# Patient Record
Sex: Male | Born: 1950 | Race: White | Hispanic: No | Marital: Married | Smoking: Former smoker
Health system: Southern US, Community
[De-identification: ages and names within clinical notes are randomized; demographics above are authoritative.]

## PROBLEM LIST (undated history)

## (undated) DIAGNOSIS — E119 Type 2 diabetes mellitus without complications: Secondary | ICD-10-CM

## (undated) DIAGNOSIS — H543 Unqualified visual loss, both eyes: Secondary | ICD-10-CM

## (undated) DIAGNOSIS — I10 Essential (primary) hypertension: Secondary | ICD-10-CM

## (undated) DIAGNOSIS — C801 Malignant (primary) neoplasm, unspecified: Secondary | ICD-10-CM

## (undated) DIAGNOSIS — N189 Chronic kidney disease, unspecified: Secondary | ICD-10-CM

## (undated) HISTORY — PX: EYE SURGERY: SHX253

## (undated) HISTORY — PX: NOSE SURGERY: SHX723

## (undated) HISTORY — PX: COLONOSCOPY: SHX174

---

## 2004-04-18 ENCOUNTER — Ambulatory Visit: Payer: Self-pay | Admitting: Oncology

## 2004-05-14 ENCOUNTER — Ambulatory Visit: Payer: Self-pay | Admitting: Oncology

## 2004-07-18 ENCOUNTER — Ambulatory Visit: Payer: Self-pay | Admitting: Oncology

## 2004-07-25 ENCOUNTER — Ambulatory Visit: Payer: Self-pay | Admitting: Oncology

## 2004-08-14 ENCOUNTER — Ambulatory Visit: Payer: Self-pay | Admitting: Oncology

## 2004-10-09 ENCOUNTER — Ambulatory Visit: Payer: Self-pay | Admitting: Oncology

## 2004-10-12 ENCOUNTER — Ambulatory Visit: Payer: Self-pay | Admitting: Oncology

## 2004-11-11 ENCOUNTER — Ambulatory Visit: Payer: Self-pay | Admitting: Oncology

## 2005-01-23 ENCOUNTER — Ambulatory Visit: Payer: Self-pay | Admitting: Oncology

## 2005-02-11 ENCOUNTER — Ambulatory Visit: Payer: Self-pay | Admitting: Oncology

## 2005-04-17 ENCOUNTER — Ambulatory Visit: Payer: Self-pay | Admitting: Oncology

## 2005-05-14 ENCOUNTER — Ambulatory Visit: Payer: Self-pay | Admitting: Oncology

## 2005-08-07 ENCOUNTER — Ambulatory Visit: Payer: Self-pay | Admitting: Oncology

## 2005-08-22 ENCOUNTER — Ambulatory Visit: Payer: Self-pay | Admitting: Oncology

## 2005-09-11 ENCOUNTER — Ambulatory Visit: Payer: Self-pay | Admitting: Oncology

## 2005-11-13 ENCOUNTER — Ambulatory Visit: Payer: Self-pay | Admitting: Oncology

## 2005-12-12 ENCOUNTER — Ambulatory Visit: Payer: Self-pay | Admitting: Oncology

## 2006-02-05 ENCOUNTER — Ambulatory Visit: Payer: Self-pay | Admitting: Oncology

## 2006-02-11 ENCOUNTER — Ambulatory Visit: Payer: Self-pay | Admitting: Oncology

## 2006-03-14 ENCOUNTER — Ambulatory Visit: Payer: Self-pay | Admitting: Oncology

## 2006-05-28 ENCOUNTER — Ambulatory Visit: Payer: Self-pay | Admitting: Oncology

## 2006-06-13 ENCOUNTER — Ambulatory Visit: Payer: Self-pay | Admitting: Oncology

## 2006-07-24 ENCOUNTER — Ambulatory Visit: Payer: Self-pay | Admitting: Oncology

## 2006-08-07 ENCOUNTER — Ambulatory Visit: Payer: Self-pay | Admitting: Oncology

## 2006-09-28 ENCOUNTER — Ambulatory Visit: Payer: Self-pay | Admitting: Gastroenterology

## 2006-11-26 ENCOUNTER — Ambulatory Visit: Payer: Self-pay | Admitting: Oncology

## 2006-12-13 ENCOUNTER — Ambulatory Visit: Payer: Self-pay | Admitting: Oncology

## 2007-04-14 ENCOUNTER — Ambulatory Visit: Payer: Self-pay | Admitting: Oncology

## 2007-05-13 ENCOUNTER — Ambulatory Visit: Payer: Self-pay | Admitting: Oncology

## 2007-05-15 ENCOUNTER — Ambulatory Visit: Payer: Self-pay | Admitting: Oncology

## 2007-08-13 ENCOUNTER — Ambulatory Visit: Payer: Self-pay | Admitting: Oncology

## 2007-09-17 ENCOUNTER — Ambulatory Visit: Payer: Self-pay | Admitting: Oncology

## 2007-11-11 ENCOUNTER — Ambulatory Visit: Payer: Self-pay | Admitting: Oncology

## 2007-11-12 ENCOUNTER — Ambulatory Visit: Payer: Self-pay | Admitting: Oncology

## 2008-06-13 ENCOUNTER — Ambulatory Visit: Payer: Self-pay | Admitting: Oncology

## 2008-06-19 ENCOUNTER — Ambulatory Visit: Payer: Self-pay | Admitting: Oncology

## 2008-07-14 ENCOUNTER — Ambulatory Visit: Payer: Self-pay | Admitting: Oncology

## 2008-08-14 ENCOUNTER — Ambulatory Visit: Payer: Self-pay | Admitting: Oncology

## 2008-09-11 ENCOUNTER — Ambulatory Visit: Payer: Self-pay | Admitting: Oncology

## 2009-02-11 ENCOUNTER — Ambulatory Visit: Payer: Self-pay | Admitting: Oncology

## 2009-02-19 ENCOUNTER — Ambulatory Visit: Payer: Self-pay | Admitting: Oncology

## 2009-03-14 ENCOUNTER — Ambulatory Visit: Payer: Self-pay | Admitting: Oncology

## 2009-06-13 ENCOUNTER — Ambulatory Visit: Payer: Self-pay | Admitting: Oncology

## 2009-06-21 ENCOUNTER — Ambulatory Visit: Payer: Self-pay | Admitting: Oncology

## 2009-06-27 ENCOUNTER — Ambulatory Visit: Payer: Self-pay | Admitting: Oncology

## 2009-07-14 ENCOUNTER — Ambulatory Visit: Payer: Self-pay | Admitting: Oncology

## 2010-01-02 ENCOUNTER — Ambulatory Visit: Payer: Self-pay | Admitting: Gastroenterology

## 2010-01-11 ENCOUNTER — Ambulatory Visit: Payer: Self-pay | Admitting: Oncology

## 2010-01-23 ENCOUNTER — Ambulatory Visit: Payer: Self-pay | Admitting: Oncology

## 2010-02-11 ENCOUNTER — Ambulatory Visit: Payer: Self-pay | Admitting: Oncology

## 2010-07-16 ENCOUNTER — Ambulatory Visit: Payer: Self-pay | Admitting: Oncology

## 2010-07-23 ENCOUNTER — Ambulatory Visit: Payer: Self-pay | Admitting: Oncology

## 2010-08-14 ENCOUNTER — Ambulatory Visit: Payer: Self-pay | Admitting: Oncology

## 2011-01-31 ENCOUNTER — Ambulatory Visit: Payer: Self-pay | Admitting: Oncology

## 2011-02-12 ENCOUNTER — Ambulatory Visit: Payer: Self-pay | Admitting: Oncology

## 2011-07-10 ENCOUNTER — Ambulatory Visit: Payer: Self-pay | Admitting: Oncology

## 2011-07-14 ENCOUNTER — Ambulatory Visit: Payer: Self-pay | Admitting: Oncology

## 2011-07-15 ENCOUNTER — Ambulatory Visit: Payer: Self-pay | Admitting: Oncology

## 2012-01-08 ENCOUNTER — Ambulatory Visit: Payer: Self-pay | Admitting: Oncology

## 2012-01-08 LAB — COMPREHENSIVE METABOLIC PANEL
Albumin: 4 g/dL (ref 3.4–5.0)
Alkaline Phosphatase: 96 U/L (ref 50–136)
BUN: 21 mg/dL — ABNORMAL HIGH (ref 7–18)
Calcium, Total: 9.1 mg/dL (ref 8.5–10.1)
Chloride: 103 mmol/L (ref 98–107)
Creatinine: 2.09 mg/dL — ABNORMAL HIGH (ref 0.60–1.30)
EGFR (African American): 38 — ABNORMAL LOW
EGFR (Non-African Amer.): 33 — ABNORMAL LOW
Glucose: 211 mg/dL — ABNORMAL HIGH (ref 65–99)
Potassium: 4.6 mmol/L (ref 3.5–5.1)
SGOT(AST): 24 U/L (ref 15–37)
Sodium: 137 mmol/L (ref 136–145)
Total Protein: 7.4 g/dL (ref 6.4–8.2)

## 2012-01-08 LAB — CBC CANCER CENTER
Basophil #: 0.1 x10 3/mm (ref 0.0–0.1)
Eosinophil %: 4.2 %
HCT: 36.7 % — ABNORMAL LOW (ref 40.0–52.0)
HGB: 12.3 g/dL — ABNORMAL LOW (ref 13.0–18.0)
Lymphocyte #: 2 x10 3/mm (ref 1.0–3.6)
Lymphocyte %: 25.3 %
MCV: 94 fL (ref 80–100)
Monocyte %: 7.7 %
Neutrophil #: 4.8 x10 3/mm (ref 1.4–6.5)
Neutrophil %: 61.4 %
Platelet: 260 x10 3/mm (ref 150–440)
RDW: 14.5 % (ref 11.5–14.5)

## 2012-01-08 LAB — LACTATE DEHYDROGENASE: LDH: 176 U/L (ref 87–241)

## 2012-01-08 LAB — SEDIMENTATION RATE: Erythrocyte Sed Rate: 10 mm/hr (ref 0–20)

## 2012-01-12 ENCOUNTER — Ambulatory Visit: Payer: Self-pay | Admitting: Oncology

## 2012-04-07 ENCOUNTER — Ambulatory Visit: Payer: Self-pay | Admitting: Oncology

## 2012-04-07 LAB — CBC CANCER CENTER
Basophil #: 0.1 x10 3/mm (ref 0.0–0.1)
Basophil %: 1.5 %
Eosinophil #: 0.2 x10 3/mm (ref 0.0–0.7)
Lymphocyte #: 1.8 x10 3/mm (ref 1.0–3.6)
MCH: 31.4 pg (ref 26.0–34.0)
MCHC: 32.7 g/dL (ref 32.0–36.0)
MCV: 96 fL (ref 80–100)
Monocyte #: 0.6 x10 3/mm (ref 0.2–1.0)
Neutrophil #: 3.9 x10 3/mm (ref 1.4–6.5)
Neutrophil %: 59.5 %
Platelet: 246 x10 3/mm (ref 150–440)
RBC: 4.04 10*6/uL — ABNORMAL LOW (ref 4.40–5.90)
RDW: 13.8 % (ref 11.5–14.5)
WBC: 6.6 x10 3/mm (ref 3.8–10.6)

## 2012-04-07 LAB — COMPREHENSIVE METABOLIC PANEL
Albumin: 3.8 g/dL (ref 3.4–5.0)
Anion Gap: 9 (ref 7–16)
BUN: 21 mg/dL — ABNORMAL HIGH (ref 7–18)
Calcium, Total: 9 mg/dL (ref 8.5–10.1)
Chloride: 102 mmol/L (ref 98–107)
Co2: 26 mmol/L (ref 21–32)
Potassium: 4.1 mmol/L (ref 3.5–5.1)
SGOT(AST): 32 U/L (ref 15–37)
SGPT (ALT): 49 U/L (ref 12–78)
Sodium: 137 mmol/L (ref 136–145)

## 2012-04-13 ENCOUNTER — Ambulatory Visit: Payer: Self-pay | Admitting: Oncology

## 2012-07-13 ENCOUNTER — Ambulatory Visit: Payer: Self-pay | Admitting: Oncology

## 2012-07-19 ENCOUNTER — Ambulatory Visit: Payer: Self-pay | Admitting: Oncology

## 2012-07-19 LAB — CBC CANCER CENTER
Basophil #: 0 x10 3/mm (ref 0.0–0.1)
Eosinophil #: 0.1 x10 3/mm (ref 0.0–0.7)
HGB: 13.2 g/dL (ref 13.0–18.0)
MCH: 31.2 pg (ref 26.0–34.0)
MCHC: 33.6 g/dL (ref 32.0–36.0)
MCV: 93 fL (ref 80–100)
Monocyte #: 0.9 x10 3/mm (ref 0.2–1.0)
Monocyte %: 10.2 %
Neutrophil #: 6.4 x10 3/mm (ref 1.4–6.5)
Neutrophil %: 68.4 %
RDW: 13.8 % (ref 11.5–14.5)
WBC: 9.3 x10 3/mm (ref 3.8–10.6)

## 2012-07-19 LAB — COMPREHENSIVE METABOLIC PANEL
Bilirubin,Total: 0.3 mg/dL (ref 0.2–1.0)
Calcium, Total: 9.2 mg/dL (ref 8.5–10.1)
Chloride: 101 mmol/L (ref 98–107)
Co2: 25 mmol/L (ref 21–32)
Creatinine: 2.08 mg/dL — ABNORMAL HIGH (ref 0.60–1.30)
EGFR (African American): 38 — ABNORMAL LOW
EGFR (Non-African Amer.): 33 — ABNORMAL LOW
SGOT(AST): 23 U/L (ref 15–37)
Sodium: 137 mmol/L (ref 136–145)

## 2012-08-14 ENCOUNTER — Ambulatory Visit: Payer: Self-pay | Admitting: Oncology

## 2013-01-19 ENCOUNTER — Ambulatory Visit: Payer: Self-pay | Admitting: Oncology

## 2013-01-24 LAB — CBC CANCER CENTER
Eosinophil #: 0.2 x10 3/mm (ref 0.0–0.7)
HCT: 37 % — ABNORMAL LOW (ref 40.0–52.0)
HGB: 12.8 g/dL — ABNORMAL LOW (ref 13.0–18.0)
Lymphocyte #: 2.3 x10 3/mm (ref 1.0–3.6)
Lymphocyte %: 29.9 %
MCH: 31.8 pg (ref 26.0–34.0)
MCV: 92 fL (ref 80–100)
Monocyte #: 0.8 x10 3/mm (ref 0.2–1.0)
Neutrophil #: 4.2 x10 3/mm (ref 1.4–6.5)
Neutrophil %: 54.8 %
RBC: 4.01 10*6/uL — ABNORMAL LOW (ref 4.40–5.90)

## 2013-01-24 LAB — COMPREHENSIVE METABOLIC PANEL
Albumin: 3.6 g/dL (ref 3.4–5.0)
Alkaline Phosphatase: 93 U/L (ref 50–136)
Anion Gap: 6 — ABNORMAL LOW (ref 7–16)
BUN: 25 mg/dL — ABNORMAL HIGH (ref 7–18)
Bilirubin,Total: 0.3 mg/dL (ref 0.2–1.0)
Calcium, Total: 8.7 mg/dL (ref 8.5–10.1)
Chloride: 107 mmol/L (ref 98–107)
Creatinine: 2.11 mg/dL — ABNORMAL HIGH (ref 0.60–1.30)
EGFR (African American): 37 — ABNORMAL LOW
EGFR (Non-African Amer.): 32 — ABNORMAL LOW
Glucose: 102 mg/dL — ABNORMAL HIGH (ref 65–99)
Osmolality: 284 (ref 275–301)
Potassium: 4.1 mmol/L (ref 3.5–5.1)
Sodium: 140 mmol/L (ref 136–145)
Total Protein: 7.1 g/dL (ref 6.4–8.2)

## 2013-01-24 LAB — LACTATE DEHYDROGENASE: LDH: 193 U/L (ref 85–241)

## 2013-01-24 LAB — SEDIMENTATION RATE: Erythrocyte Sed Rate: 12 mm/hr (ref 0–20)

## 2013-02-11 ENCOUNTER — Ambulatory Visit: Payer: Self-pay | Admitting: Oncology

## 2013-07-22 ENCOUNTER — Ambulatory Visit: Payer: Self-pay | Admitting: Oncology

## 2013-07-25 ENCOUNTER — Ambulatory Visit: Payer: Self-pay | Admitting: Oncology

## 2013-07-25 LAB — CBC CANCER CENTER
Basophil #: 0.1 x10 3/mm (ref 0.0–0.1)
Basophil %: 0.7 %
EOS PCT: 2 %
Eosinophil #: 0.2 x10 3/mm (ref 0.0–0.7)
HCT: 42.8 % (ref 40.0–52.0)
HGB: 13.9 g/dL (ref 13.0–18.0)
LYMPHS ABS: 1.8 x10 3/mm (ref 1.0–3.6)
LYMPHS PCT: 14.9 %
MCH: 30.9 pg (ref 26.0–34.0)
MCHC: 32.6 g/dL (ref 32.0–36.0)
MCV: 95 fL (ref 80–100)
MONO ABS: 0.9 x10 3/mm (ref 0.2–1.0)
Monocyte %: 7.9 %
NEUTROS PCT: 74.5 %
Neutrophil #: 8.9 x10 3/mm — ABNORMAL HIGH (ref 1.4–6.5)
PLATELETS: 265 x10 3/mm (ref 150–440)
RBC: 4.52 10*6/uL (ref 4.40–5.90)
RDW: 14.1 % (ref 11.5–14.5)
WBC: 12 x10 3/mm — ABNORMAL HIGH (ref 3.8–10.6)

## 2013-07-25 LAB — COMPREHENSIVE METABOLIC PANEL
ALT: 38 U/L (ref 12–78)
AST: 22 U/L (ref 15–37)
Albumin: 3.7 g/dL (ref 3.4–5.0)
Alkaline Phosphatase: 78 U/L
Anion Gap: 8 (ref 7–16)
BUN: 26 mg/dL — AB (ref 7–18)
Bilirubin,Total: 0.3 mg/dL (ref 0.2–1.0)
CO2: 26 mmol/L (ref 21–32)
Calcium, Total: 9 mg/dL (ref 8.5–10.1)
Chloride: 102 mmol/L (ref 98–107)
Creatinine: 2.04 mg/dL — ABNORMAL HIGH (ref 0.60–1.30)
EGFR (African American): 39 — ABNORMAL LOW
EGFR (Non-African Amer.): 33 — ABNORMAL LOW
Glucose: 198 mg/dL — ABNORMAL HIGH (ref 65–99)
Osmolality: 282 (ref 275–301)
POTASSIUM: 5 mmol/L (ref 3.5–5.1)
SODIUM: 136 mmol/L (ref 136–145)
Total Protein: 7.1 g/dL (ref 6.4–8.2)

## 2013-07-25 LAB — LACTATE DEHYDROGENASE: LDH: 176 U/L (ref 85–241)

## 2013-08-14 ENCOUNTER — Ambulatory Visit: Payer: Self-pay | Admitting: Oncology

## 2013-12-09 DIAGNOSIS — N183 Chronic kidney disease, stage 3 unspecified: Secondary | ICD-10-CM | POA: Insufficient documentation

## 2013-12-09 DIAGNOSIS — K219 Gastro-esophageal reflux disease without esophagitis: Secondary | ICD-10-CM | POA: Insufficient documentation

## 2013-12-09 DIAGNOSIS — L409 Psoriasis, unspecified: Secondary | ICD-10-CM | POA: Insufficient documentation

## 2013-12-23 ENCOUNTER — Ambulatory Visit: Payer: Self-pay | Admitting: Oncology

## 2014-02-20 ENCOUNTER — Ambulatory Visit: Payer: Self-pay | Admitting: Oncology

## 2014-02-20 LAB — COMPREHENSIVE METABOLIC PANEL
ALK PHOS: 106 U/L
Albumin: 3.4 g/dL (ref 3.4–5.0)
Anion Gap: 9 (ref 7–16)
BUN: 22 mg/dL — ABNORMAL HIGH (ref 7–18)
Bilirubin,Total: 0.2 mg/dL (ref 0.2–1.0)
CO2: 27 mmol/L (ref 21–32)
Calcium, Total: 8.3 mg/dL — ABNORMAL LOW (ref 8.5–10.1)
Chloride: 101 mmol/L (ref 98–107)
Creatinine: 2.09 mg/dL — ABNORMAL HIGH (ref 0.60–1.30)
EGFR (African American): 38 — ABNORMAL LOW
EGFR (Non-African Amer.): 32 — ABNORMAL LOW
GLUCOSE: 234 mg/dL — AB (ref 65–99)
Osmolality: 285 (ref 275–301)
Potassium: 4.3 mmol/L (ref 3.5–5.1)
SGOT(AST): 22 U/L (ref 15–37)
SGPT (ALT): 48 U/L
SODIUM: 137 mmol/L (ref 136–145)
Total Protein: 7.1 g/dL (ref 6.4–8.2)

## 2014-02-20 LAB — LACTATE DEHYDROGENASE: LDH: 278 U/L — ABNORMAL HIGH (ref 85–241)

## 2014-02-20 LAB — CBC CANCER CENTER
Basophil #: 0 x10 3/mm (ref 0.0–0.1)
Basophil %: 0.2 %
EOS ABS: 0.2 x10 3/mm (ref 0.0–0.7)
EOS PCT: 1.8 %
HCT: 39.3 % — ABNORMAL LOW (ref 40.0–52.0)
HGB: 13.1 g/dL (ref 13.0–18.0)
LYMPHS ABS: 2 x10 3/mm (ref 1.0–3.6)
LYMPHS PCT: 18.9 %
MCH: 31.4 pg (ref 26.0–34.0)
MCHC: 33.2 g/dL (ref 32.0–36.0)
MCV: 95 fL (ref 80–100)
MONO ABS: 0.9 x10 3/mm (ref 0.2–1.0)
Monocyte %: 8.5 %
Neutrophil #: 7.4 x10 3/mm — ABNORMAL HIGH (ref 1.4–6.5)
Neutrophil %: 70.6 %
Platelet: 280 x10 3/mm (ref 150–440)
RBC: 4.16 10*6/uL — ABNORMAL LOW (ref 4.40–5.90)
RDW: 14.4 % (ref 11.5–14.5)
WBC: 10.4 x10 3/mm (ref 3.8–10.6)

## 2014-03-14 ENCOUNTER — Ambulatory Visit: Payer: Self-pay | Admitting: Oncology

## 2014-03-23 LAB — CBC CANCER CENTER
BASOS ABS: 0.1 x10 3/mm (ref 0.0–0.1)
BASOS PCT: 0.7 %
Eosinophil #: 0.1 x10 3/mm (ref 0.0–0.7)
Eosinophil %: 1.2 %
HCT: 37.9 % — ABNORMAL LOW (ref 40.0–52.0)
HGB: 12.5 g/dL — AB (ref 13.0–18.0)
LYMPHS ABS: 0.9 x10 3/mm — AB (ref 1.0–3.6)
Lymphocyte %: 10.1 %
MCH: 31 pg (ref 26.0–34.0)
MCHC: 33 g/dL (ref 32.0–36.0)
MCV: 94 fL (ref 80–100)
MONO ABS: 1.3 x10 3/mm — AB (ref 0.2–1.0)
Monocyte %: 13.5 %
NEUTROS ABS: 6.9 x10 3/mm — AB (ref 1.4–6.5)
Neutrophil %: 74.5 %
Platelet: 324 x10 3/mm (ref 150–440)
RBC: 4.04 10*6/uL — AB (ref 4.40–5.90)
RDW: 13 % (ref 11.5–14.5)
WBC: 9.3 x10 3/mm (ref 3.8–10.6)

## 2014-03-23 LAB — COMPREHENSIVE METABOLIC PANEL
ALBUMIN: 3 g/dL — AB (ref 3.4–5.0)
ALT: 39 U/L
Alkaline Phosphatase: 74 U/L
Anion Gap: 13 (ref 7–16)
BUN: 34 mg/dL — ABNORMAL HIGH (ref 7–18)
Bilirubin,Total: 0.4 mg/dL (ref 0.2–1.0)
CREATININE: 2.16 mg/dL — AB (ref 0.60–1.30)
Calcium, Total: 8.5 mg/dL (ref 8.5–10.1)
Chloride: 98 mmol/L (ref 98–107)
Co2: 21 mmol/L (ref 21–32)
EGFR (Non-African Amer.): 31 — ABNORMAL LOW
GFR CALC AF AMER: 36 — AB
Glucose: 197 mg/dL — ABNORMAL HIGH (ref 65–99)
Osmolality: 278 (ref 275–301)
POTASSIUM: 4.5 mmol/L (ref 3.5–5.1)
SGOT(AST): 20 U/L (ref 15–37)
SODIUM: 132 mmol/L — AB (ref 136–145)
Total Protein: 7.3 g/dL (ref 6.4–8.2)

## 2014-03-23 LAB — CLOSTRIDIUM DIFFICILE(ARMC)

## 2014-03-23 LAB — LACTATE DEHYDROGENASE: LDH: 127 U/L (ref 85–241)

## 2014-04-13 ENCOUNTER — Ambulatory Visit: Payer: Self-pay | Admitting: Oncology

## 2014-07-26 ENCOUNTER — Ambulatory Visit: Payer: Self-pay | Admitting: Oncology

## 2014-07-28 ENCOUNTER — Ambulatory Visit: Payer: Self-pay | Admitting: Oncology

## 2014-07-31 LAB — CBC CANCER CENTER
BASOS PCT: 1.4 %
Basophil #: 0.2 x10 3/mm — ABNORMAL HIGH (ref 0.0–0.1)
EOS ABS: 0.3 x10 3/mm (ref 0.0–0.7)
EOS PCT: 2.8 %
HCT: 42.2 % (ref 40.0–52.0)
HGB: 13.9 g/dL (ref 13.0–18.0)
LYMPHS PCT: 14.4 %
Lymphocyte #: 1.7 x10 3/mm (ref 1.0–3.6)
MCH: 30.8 pg (ref 26.0–34.0)
MCHC: 33 g/dL (ref 32.0–36.0)
MCV: 93 fL (ref 80–100)
MONO ABS: 0.8 x10 3/mm (ref 0.2–1.0)
Monocyte %: 6.8 %
NEUTROS PCT: 74.6 %
Neutrophil #: 8.6 x10 3/mm — ABNORMAL HIGH (ref 1.4–6.5)
Platelet: 239 x10 3/mm (ref 150–440)
RBC: 4.52 10*6/uL (ref 4.40–5.90)
RDW: 14.5 % (ref 11.5–14.5)
WBC: 11.6 x10 3/mm — ABNORMAL HIGH (ref 3.8–10.6)

## 2014-07-31 LAB — COMPREHENSIVE METABOLIC PANEL
ALK PHOS: 89 U/L
Albumin: 3.5 g/dL (ref 3.4–5.0)
Anion Gap: 8 (ref 7–16)
BUN: 20 mg/dL — AB (ref 7–18)
Bilirubin,Total: 0.2 mg/dL (ref 0.2–1.0)
Calcium, Total: 8.2 mg/dL — ABNORMAL LOW (ref 8.5–10.1)
Chloride: 105 mmol/L (ref 98–107)
Co2: 26 mmol/L (ref 21–32)
Creatinine: 2.04 mg/dL — ABNORMAL HIGH (ref 0.60–1.30)
EGFR (African American): 42 — ABNORMAL LOW
EGFR (Non-African Amer.): 35 — ABNORMAL LOW
Glucose: 207 mg/dL — ABNORMAL HIGH (ref 65–99)
Osmolality: 286 (ref 275–301)
POTASSIUM: 4.2 mmol/L (ref 3.5–5.1)
SGOT(AST): 21 U/L (ref 15–37)
SGPT (ALT): 38 U/L
Sodium: 139 mmol/L (ref 136–145)
TOTAL PROTEIN: 6.9 g/dL (ref 6.4–8.2)

## 2014-07-31 LAB — LACTATE DEHYDROGENASE: LDH: 183 U/L (ref 85–241)

## 2014-08-14 ENCOUNTER — Ambulatory Visit: Payer: Self-pay | Admitting: Oncology

## 2014-08-22 ENCOUNTER — Ambulatory Visit: Payer: Self-pay | Admitting: Internal Medicine

## 2014-08-25 ENCOUNTER — Ambulatory Visit: Payer: Self-pay | Admitting: Internal Medicine

## 2014-09-12 ENCOUNTER — Ambulatory Visit
Admit: 2014-09-12 | Disposition: A | Discharge status: Home / Self Care | Payer: Self-pay | Attending: Oncology | Admitting: Oncology

## 2014-10-13 ENCOUNTER — Ambulatory Visit
Admit: 2014-10-13 | Disposition: A | Discharge status: Home / Self Care | Payer: Self-pay | Attending: Oncology | Admitting: Oncology

## 2014-10-27 ENCOUNTER — Other Ambulatory Visit: Payer: Self-pay | Admitting: Oncology

## 2014-10-27 DIAGNOSIS — R599 Enlarged lymph nodes, unspecified: Secondary | ICD-10-CM

## 2014-11-06 LAB — CYTOLOGY - NON PAP

## 2015-01-05 ENCOUNTER — Other Ambulatory Visit: Payer: Self-pay | Admitting: Family Medicine

## 2015-01-08 ENCOUNTER — Ambulatory Visit
Admission: RE | Admit: 2015-01-08 | Discharge: 2015-01-08 | Disposition: A | Discharge status: Home / Self Care | Payer: Medicare Other | Source: Ambulatory Visit | Attending: Oncology | Admitting: Oncology

## 2015-01-08 ENCOUNTER — Other Ambulatory Visit: Payer: Medicare Other

## 2015-01-10 ENCOUNTER — Inpatient Hospital Stay: Payer: Medicare Other | Admitting: Oncology

## 2015-01-10 ENCOUNTER — Inpatient Hospital Stay: Payer: Medicare Other

## 2015-02-02 ENCOUNTER — Other Ambulatory Visit: Payer: Self-pay | Admitting: *Deleted

## 2015-02-02 DIAGNOSIS — C8590 Non-Hodgkin lymphoma, unspecified, unspecified site: Secondary | ICD-10-CM

## 2015-02-05 ENCOUNTER — Ambulatory Visit
Admission: RE | Admit: 2015-02-05 | Discharge: 2015-02-05 | Disposition: A | Discharge status: Home / Self Care | Payer: Medicare Other | Source: Ambulatory Visit | Attending: Oncology | Admitting: Oncology

## 2015-02-05 DIAGNOSIS — J439 Emphysema, unspecified: Secondary | ICD-10-CM | POA: Diagnosis not present

## 2015-02-05 DIAGNOSIS — I2510 Atherosclerotic heart disease of native coronary artery without angina pectoris: Secondary | ICD-10-CM | POA: Diagnosis not present

## 2015-02-05 DIAGNOSIS — R590 Localized enlarged lymph nodes: Secondary | ICD-10-CM | POA: Diagnosis not present

## 2015-02-05 DIAGNOSIS — R599 Enlarged lymph nodes, unspecified: Secondary | ICD-10-CM | POA: Diagnosis present

## 2015-02-07 ENCOUNTER — Encounter: Payer: Self-pay | Admitting: Oncology

## 2015-02-07 ENCOUNTER — Inpatient Hospital Stay: Payer: Medicare Other | Attending: Oncology | Admitting: Oncology

## 2015-02-07 ENCOUNTER — Inpatient Hospital Stay: Payer: Medicare Other

## 2015-02-07 VITALS — BP 164/85 | HR 64 | Temp 97.2°F | Wt 185.6 lb

## 2015-02-07 DIAGNOSIS — H540 Blindness, both eyes: Secondary | ICD-10-CM | POA: Insufficient documentation

## 2015-02-07 DIAGNOSIS — Z7982 Long term (current) use of aspirin: Secondary | ICD-10-CM

## 2015-02-07 DIAGNOSIS — Z923 Personal history of irradiation: Secondary | ICD-10-CM | POA: Diagnosis not present

## 2015-02-07 DIAGNOSIS — E119 Type 2 diabetes mellitus without complications: Secondary | ICD-10-CM | POA: Diagnosis not present

## 2015-02-07 DIAGNOSIS — C8518 Unspecified B-cell lymphoma, lymph nodes of multiple sites: Secondary | ICD-10-CM | POA: Insufficient documentation

## 2015-02-07 DIAGNOSIS — N189 Chronic kidney disease, unspecified: Secondary | ICD-10-CM | POA: Diagnosis not present

## 2015-02-07 DIAGNOSIS — H543 Unqualified visual loss, both eyes: Secondary | ICD-10-CM | POA: Insufficient documentation

## 2015-02-07 DIAGNOSIS — Z79899 Other long term (current) drug therapy: Secondary | ICD-10-CM | POA: Diagnosis not present

## 2015-02-07 DIAGNOSIS — I129 Hypertensive chronic kidney disease with stage 1 through stage 4 chronic kidney disease, or unspecified chronic kidney disease: Secondary | ICD-10-CM | POA: Insufficient documentation

## 2015-02-07 DIAGNOSIS — Z87891 Personal history of nicotine dependence: Secondary | ICD-10-CM | POA: Insufficient documentation

## 2015-02-07 DIAGNOSIS — C8590 Non-Hodgkin lymphoma, unspecified, unspecified site: Secondary | ICD-10-CM

## 2015-02-07 DIAGNOSIS — E1122 Type 2 diabetes mellitus with diabetic chronic kidney disease: Secondary | ICD-10-CM

## 2015-02-07 DIAGNOSIS — Z9221 Personal history of antineoplastic chemotherapy: Secondary | ICD-10-CM | POA: Diagnosis not present

## 2015-02-07 DIAGNOSIS — E669 Obesity, unspecified: Secondary | ICD-10-CM | POA: Insufficient documentation

## 2015-02-07 DIAGNOSIS — I10 Essential (primary) hypertension: Secondary | ICD-10-CM | POA: Insufficient documentation

## 2015-02-07 LAB — COMPREHENSIVE METABOLIC PANEL
ALT: 31 U/L (ref 17–63)
AST: 26 U/L (ref 15–41)
Albumin: 4.1 g/dL (ref 3.5–5.0)
Alkaline Phosphatase: 94 U/L (ref 38–126)
Anion gap: 4 — ABNORMAL LOW (ref 5–15)
BILIRUBIN TOTAL: 0.6 mg/dL (ref 0.3–1.2)
BUN: 22 mg/dL — ABNORMAL HIGH (ref 6–20)
CHLORIDE: 101 mmol/L (ref 101–111)
CO2: 27 mmol/L (ref 22–32)
Calcium: 8.6 mg/dL — ABNORMAL LOW (ref 8.9–10.3)
Creatinine, Ser: 1.85 mg/dL — ABNORMAL HIGH (ref 0.61–1.24)
GFR calc non Af Amer: 37 mL/min — ABNORMAL LOW (ref >60)
GFR, EST AFRICAN AMERICAN: 42 mL/min — AB (ref >60)
Glucose, Bld: 233 mg/dL — ABNORMAL HIGH (ref 65–99)
POTASSIUM: 4.5 mmol/L (ref 3.5–5.1)
Sodium: 132 mmol/L — ABNORMAL LOW (ref 135–145)
TOTAL PROTEIN: 6.9 g/dL (ref 6.5–8.1)

## 2015-02-07 LAB — CBC WITH DIFFERENTIAL/PLATELET
BASOS PCT: 1 %
Basophils Absolute: 0.1 10*3/uL (ref 0–0.1)
Eosinophils Absolute: 0.2 10*3/uL (ref 0–0.7)
Eosinophils Relative: 2 %
HEMATOCRIT: 42.5 % (ref 40.0–52.0)
HEMOGLOBIN: 14 g/dL (ref 13.0–18.0)
Lymphocytes Relative: 22 %
Lymphs Abs: 1.9 10*3/uL (ref 1.0–3.6)
MCH: 31.3 pg (ref 26.0–34.0)
MCHC: 32.9 g/dL (ref 32.0–36.0)
MCV: 95.2 fL (ref 80.0–100.0)
Monocytes Absolute: 0.6 10*3/uL (ref 0.2–1.0)
Monocytes Relative: 7 %
NEUTROS PCT: 68 %
Neutro Abs: 6.1 10*3/uL (ref 1.4–6.5)
Platelets: 246 10*3/uL (ref 150–440)
RBC: 4.47 MIL/uL (ref 4.40–5.90)
RDW: 14.6 % — AB (ref 11.5–14.5)
WBC: 9 10*3/uL (ref 3.8–10.6)

## 2015-02-07 LAB — LACTATE DEHYDROGENASE: LDH: 160 U/L (ref 98–192)

## 2015-02-07 NOTE — Progress Notes (Signed)
Patient does have living will.  Former smoker.  Currently chews tobacco.
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
## 2015-04-30 DIAGNOSIS — E119 Type 2 diabetes mellitus without complications: Secondary | ICD-10-CM | POA: Insufficient documentation

## 2015-05-09 ENCOUNTER — Encounter: Payer: Self-pay | Admitting: *Deleted

## 2015-05-10 ENCOUNTER — Encounter
Admission: RE | Disposition: A | Discharge status: Home / Self Care | Payer: Self-pay | Source: Ambulatory Visit | Attending: Gastroenterology

## 2015-05-10 ENCOUNTER — Ambulatory Visit: Payer: Medicare Other | Admitting: Certified Registered Nurse Anesthetist

## 2015-05-10 ENCOUNTER — Ambulatory Visit
Admission: RE | Admit: 2015-05-10 | Discharge: 2015-05-10 | Disposition: A | Discharge status: Home / Self Care | Payer: Medicare Other | Source: Ambulatory Visit | Attending: Gastroenterology | Admitting: Gastroenterology

## 2015-05-10 DIAGNOSIS — I129 Hypertensive chronic kidney disease with stage 1 through stage 4 chronic kidney disease, or unspecified chronic kidney disease: Secondary | ICD-10-CM | POA: Insufficient documentation

## 2015-05-10 DIAGNOSIS — E669 Obesity, unspecified: Secondary | ICD-10-CM | POA: Diagnosis not present

## 2015-05-10 DIAGNOSIS — K5730 Diverticulosis of large intestine without perforation or abscess without bleeding: Secondary | ICD-10-CM | POA: Insufficient documentation

## 2015-05-10 DIAGNOSIS — Z7982 Long term (current) use of aspirin: Secondary | ICD-10-CM | POA: Insufficient documentation

## 2015-05-10 DIAGNOSIS — E1022 Type 1 diabetes mellitus with diabetic chronic kidney disease: Secondary | ICD-10-CM | POA: Insufficient documentation

## 2015-05-10 DIAGNOSIS — Z882 Allergy status to sulfonamides status: Secondary | ICD-10-CM | POA: Insufficient documentation

## 2015-05-10 DIAGNOSIS — Z6829 Body mass index (BMI) 29.0-29.9, adult: Secondary | ICD-10-CM | POA: Insufficient documentation

## 2015-05-10 DIAGNOSIS — N189 Chronic kidney disease, unspecified: Secondary | ICD-10-CM | POA: Diagnosis not present

## 2015-05-10 DIAGNOSIS — Z79899 Other long term (current) drug therapy: Secondary | ICD-10-CM | POA: Insufficient documentation

## 2015-05-10 DIAGNOSIS — D120 Benign neoplasm of cecum: Secondary | ICD-10-CM | POA: Diagnosis not present

## 2015-05-10 DIAGNOSIS — Z8572 Personal history of non-Hodgkin lymphomas: Secondary | ICD-10-CM | POA: Insufficient documentation

## 2015-05-10 DIAGNOSIS — Z794 Long term (current) use of insulin: Secondary | ICD-10-CM | POA: Diagnosis not present

## 2015-05-10 DIAGNOSIS — Z87891 Personal history of nicotine dependence: Secondary | ICD-10-CM | POA: Insufficient documentation

## 2015-05-10 DIAGNOSIS — J449 Chronic obstructive pulmonary disease, unspecified: Secondary | ICD-10-CM | POA: Insufficient documentation

## 2015-05-10 DIAGNOSIS — Z7984 Long term (current) use of oral hypoglycemic drugs: Secondary | ICD-10-CM | POA: Diagnosis not present

## 2015-05-10 DIAGNOSIS — Z86010 Personal history of colonic polyps: Secondary | ICD-10-CM | POA: Insufficient documentation

## 2015-05-10 HISTORY — DX: Type 2 diabetes mellitus without complications: E11.9

## 2015-05-10 HISTORY — PX: COLONOSCOPY WITH PROPOFOL: SHX5780

## 2015-05-10 HISTORY — DX: Chronic kidney disease, unspecified: N18.9

## 2015-05-10 HISTORY — DX: Malignant (primary) neoplasm, unspecified: C80.1

## 2015-05-10 HISTORY — DX: Essential (primary) hypertension: I10

## 2015-05-10 LAB — GLUCOSE, CAPILLARY: GLUCOSE-CAPILLARY: 131 mg/dL — AB (ref 65–99)

## 2015-05-10 SURGERY — COLONOSCOPY WITH PROPOFOL
Anesthesia: General

## 2015-05-10 MED ORDER — SODIUM CHLORIDE 0.9 % IV SOLN: INTRAVENOUS | Status: DC

## 2015-05-10 MED ORDER — PROPOFOL 500 MG/50ML IV EMUL
INTRAVENOUS | Status: DC | PRN
  Administered 2015-05-10: 100 ug/kg/min via INTRAVENOUS

## 2015-05-10 MED ORDER — MIDAZOLAM HCL 2 MG/2ML IJ SOLN
INTRAMUSCULAR | Status: DC | PRN
  Administered 2015-05-10: 1 mg via INTRAVENOUS

## 2015-05-10 MED ORDER — FENTANYL CITRATE (PF) 100 MCG/2ML IJ SOLN
INTRAMUSCULAR | Status: DC | PRN
  Administered 2015-05-10: 50 ug via INTRAVENOUS

## 2015-05-10 MED ORDER — SODIUM CHLORIDE 0.9 % IV SOLN
INTRAVENOUS | Status: DC
  Administered 2015-05-10: 1000 mL via INTRAVENOUS

## 2015-05-10 MED ORDER — LIDOCAINE HCL (CARDIAC) 20 MG/ML IV SOLN
INTRAVENOUS | Status: DC | PRN
  Administered 2015-05-10: 50 mg via INTRAVENOUS

## 2015-05-10 NOTE — Op Note (Signed)
[facility] Patient Name: Mitchell Chang Procedure Date: 05/10/2015 8:02 AM MRN: [id number] Account #: [id number] Date of Birth: [birth year] Admit Type: Outpatient Age: 64 Room: [facility] ENDO ROOM 4 Gender: Male Note Status: Finalized Procedure:         Colonoscopy Indications:       Personal history of colonic polyps Providers:         Lupita Dawn. Candace Cruise, MD Referring MD:      Youlanda Roys. Ola Spurr, MD (Referring MD) Medicines:         Monitored Anesthesia Care Complications:     No immediate complications. Procedure:         Pre-Anesthesia Assessment:                    - Prior to the procedure, a History and Physical was                     performed, and patient medications, allergies and                     sensitivities were reviewed. The patient's tolerance of                     previous anesthesia was reviewed.                    - The risks and benefits of the procedure and the sedation                     options and risks were discussed with the patient. All                     questions were answered and informed consent was obtained.                    - After reviewing the risks and benefits, the patient was                     deemed in satisfactory condition to undergo the procedure.                    After obtaining informed consent, the colonoscope was                     passed under direct vision. Throughout the procedure, the                     patient's blood pressure, pulse, and oxygen saturations                     were monitored continuously. The Colonoscope was                     introduced through the anus and advanced to the the cecum,                     identified by appendiceal orifice and ileocecal valve. The                     colonoscopy was performed without difficulty. The patient                     tolerated the procedure well. The quality of the bowel  preparation was fair. Findings:      Multiple  small and large-mouthed diverticula were found in the sigmoid       colon, in the descending colon and in the ascending colon.      A small polyp was found in the cecum. The polyp was sessile. The polyp       was removed with a jumbo cold forceps. Resection and retrieval were       complete.      The exam was otherwise without abnormality. Impression:        - Diverticulosis in the sigmoid colon, in the descending                     colon and in the ascending colon.                    - One small polyp in the cecum. Resected and retrieved.                    - The examination was otherwise normal. Recommendation:    - Discharge patient to home.                    - Await pathology results.                    - Repeat colonoscopy in 5 years for surveillance based on                     pathology results.                    - The findings and recommendations were discussed with the                     patient. Procedure Code(s): --- Professional ---                    [phone], Colonoscopy, flexible; with biopsy, single or                     multiple Diagnosis Code(s): --- Professional ---                    D12.0, Benign neoplasm of cecum                    Z86.010, Personal history of colonic polyps                    K57.30, Diverticulosis of large intestine without                     perforation or abscess without bleeding CPT copyright 2014 American Medical Association. All rights reserved. The codes documented in this report are preliminary and upon coder review may  be revised to meet current compliance requirements. Hulen Luster, MD 05/10/2015 8:32:23 AM This report has been signed electronically. Number of Addenda: 0 Note Initiated On: 05/10/2015 8:02 AM Scope Withdrawal Time: 0 hours 13 minutes 30 seconds  Total Procedure Duration: 0 hours 19 minutes 47 seconds       [facility]

## 2015-05-10 NOTE — Anesthesia Procedure Notes (Signed)
Performed by: Vaughan Sine Pre-anesthesia Checklist: Patient identified, Emergency Drugs available, Suction available, Patient being monitored and Timeout performed Patient Re-evaluated:Patient Re-evaluated prior to inductionOxygen Delivery Method: Nasal cannula Preoxygenation: Pre-oxygenation with 100% oxygen Intubation Type: IV induction Airway Equipment and Method: Oral airway

## 2015-05-10 NOTE — Anesthesia Preprocedure Evaluation (Signed)
Anesthesia Evaluation  Patient identified by MRN, date of birth, ID band Patient awake    Reviewed: Allergy & Precautions, NPO status , Patient's Chart, lab work & pertinent test results  Airway Mallampati: III       Dental  (+) Poor Dentition   Pulmonary COPD, former smoker,    Pulmonary exam normal        Cardiovascular hypertension, Pt. on medications and Pt. on home beta blockers Normal cardiovascular exam     Neuro/Psych    GI/Hepatic Neg liver ROS, GERD  ,  Endo/Other  diabetes, Well Controlled, Type 1, Insulin Dependent  Renal/GU      Musculoskeletal   Abdominal (+) + obese,   Peds  Hematology   Anesthesia Other Findings   Reproductive/Obstetrics                             Anesthesia Physical Anesthesia Plan  ASA: III  Anesthesia Plan: General   Post-op Pain Management:    Induction: Intravenous  Airway Management Planned: Nasal Cannula  Additional Equipment:   Intra-op Plan:   Post-operative Plan:   Informed Consent: I have reviewed the patients History and Physical, chart, labs and discussed the procedure including the risks, benefits and alternatives for the proposed anesthesia with the patient or authorized representative who has indicated his/her understanding and acceptance.     Plan Discussed with: CRNA  Anesthesia Plan Comments:         Anesthesia Quick Evaluation

## 2015-05-10 NOTE — Anesthesia Postprocedure Evaluation (Signed)
  Anesthesia Post-op Note  Patient: Mitchell Chang.  Procedure(s) Performed: Procedure(s): COLONOSCOPY WITH PROPOFOL (N/A)  Anesthesia type:General  Patient location: PACU  Post pain: Pain level controlled  Post assessment: Post-op Vital signs reviewed, Patient's Cardiovascular Status Stable, Respiratory Function Stable, Patent Airway and No signs of Nausea or vomiting  Post vital signs: Reviewed and stable  Last Vitals:  Filed Vitals:   05/10/15 0839  BP: 106/65  Pulse:   Temp:   Resp:     Level of consciousness: awake, alert  and patient cooperative  Complications: No apparent anesthesia complications

## 2015-05-10 NOTE — Transfer of Care (Signed)
Immediate Anesthesia Transfer of Care Note  Patient: Mitchell Chang.  Procedure(s) Performed: Procedure(s): COLONOSCOPY WITH PROPOFOL (N/A)  Patient Location: PACU  Anesthesia Type:General  Level of Consciousness: awake and sedated  Airway & Oxygen Therapy: Patient Spontanous Breathing and Patient connected to nasal cannula oxygen  Post-op Assessment: Report given to RN and Post -op Vital signs reviewed and stable  Post vital signs: Reviewed and stable  Last Vitals:  Filed Vitals:   05/10/15 0837  BP:   Pulse:   Temp: 36.1 C  Resp:     Complications: No apparent anesthesia complications

## 2015-05-10 NOTE — H&P (Signed)
Primary Care Physician:  Adrian Prows, MD Primary Gastroenterologist:  Dr. Candace Cruise  Pre-Procedure History & Physical: HPI:  Mitchell Chang. is a 64 y.o. male is here for an colonoscopy.   Past Medical History  Diagnosis Date  . Hypertension   . Diabetes mellitus without complication ([place])   . Chronic kidney disease   . Cancer ([place])     non-hodgkins lymphoma    Past Surgical History  Procedure Laterality Date  . Nose surgery    . Eye surgery    . Colonoscopy      Prior to Admission medications   Medication Sig Start Date End Date Taking? Authorizing Provider  enalapril (VASOTEC) 20 MG tablet TAKE ONE TABLET BY MOUTH EVERY DAY 11/17/14  Yes Historical Provider, MD  metoprolol (LOPRESSOR) 100 MG tablet Take by mouth. 11/24/13  Yes Historical Provider, MD  NIFEdipine (PROCARDIA XL/ADALAT-CC) 60 MG 24 hr tablet Take by mouth.   Yes Historical Provider, MD  aspirin EC 81 MG tablet Take by mouth.    Historical Provider, MD  cholecalciferol (VITAMIN D) 1000 UNITS tablet Take 1,000 Units by mouth 2 (two) times daily.    Historical Provider, MD  clobetasol (TEMOVATE) 0.05 % external solution  01/31/15   Historical Provider, MD  doxepin (SINEQUAN) 50 MG capsule Take by mouth. 01/10/14   Historical Provider, MD  glipiZIDE (GLUCOTROL) 10 MG tablet Take by mouth. 06/16/14   Historical Provider, MD  Insulin Lispro Prot & Lispro (HUMALOG MIX 50/50 KWIKPEN) (50-50) 100 UNIT/ML Kwikpen Inject 45 units before breakfast and 28 units before supper 07/21/14   Historical Provider, MD  omeprazole (PRILOSEC) 20 MG capsule Take by mouth. 06/16/14   Historical Provider, MD  prednisoLONE sodium phosphate (INFLAMASE FORTE) 1 % ophthalmic solution Apply 1 % to eye.    Historical Provider, MD  sitaGLIPtin (JANUVIA) 50 MG tablet TAKE ONE TABLET BY MOUTH EVERY DAY 10/03/14   Historical Provider, MD    Allergies as of 04/03/2015 - Review Complete 02/07/2015  Allergen Reaction Noted  . Sulfa antibiotics Other  (See Comments) 02/05/2015    History reviewed. No pertinent family history.  Social History   Social History  . Marital Status: Married    Spouse Name: N/A  . Number of Children: N/A  . Years of Education: N/A   Occupational History  . Not on file.   Social History Main Topics  . Smoking status: Former Research scientist (life sciences)  . Smokeless tobacco: Not on file  . Alcohol Use: Not on file  . Drug Use: Not on file  . Sexual Activity: Not on file   Other Topics Concern  . Not on file   Social History Narrative    Review of Systems: See HPI, otherwise negative ROS  Physical Exam: BP 156/86 mmHg  Pulse 62  Temp(Src) 97 F (36.1 C) (Tympanic)  Resp 18  Ht 5\' 7"  (1.702 m)  Wt 86.183 kg (190 lb)  BMI 29.75 kg/m2  SpO2 100% General:   Alert,  pleasant and cooperative in NAD Head:  Normocephalic and atraumatic. Neck:  Supple; no masses or thyromegaly. Lungs:  Clear throughout to auscultation.    Heart:  Regular rate and rhythm. Abdomen:  Soft, nontender and nondistended. Normal bowel sounds, without guarding, and without rebound.   Neurologic:  Alert and  oriented x4;  grossly normal neurologically.  Impression/Plan: Mitchell Chad. is here for an colonoscopy to be performed for personal hx of colon polyps Risks, benefits, limitations, and alternatives regarding  colonoscopy have been reviewed with the patient.  Questions have been answered.  All parties agreeable.   Mitchell Chang, Mitchell Dawn, MD  05/10/2015, 7:59 AM

## 2015-05-11 LAB — SURGICAL PATHOLOGY

## 2015-05-13 ENCOUNTER — Encounter: Payer: Self-pay | Admitting: Gastroenterology

## 2015-06-13 ENCOUNTER — Inpatient Hospital Stay: Payer: Medicare Other | Attending: Oncology | Admitting: Oncology

## 2015-06-13 ENCOUNTER — Inpatient Hospital Stay: Payer: Medicare Other

## 2015-06-13 ENCOUNTER — Encounter: Payer: Self-pay | Admitting: Oncology

## 2015-06-13 VITALS — BP 142/92 | HR 63 | Temp 96.7°F | Wt 185.0 lb

## 2015-06-13 DIAGNOSIS — E119 Type 2 diabetes mellitus without complications: Secondary | ICD-10-CM | POA: Diagnosis not present

## 2015-06-13 DIAGNOSIS — Z7982 Long term (current) use of aspirin: Secondary | ICD-10-CM | POA: Diagnosis not present

## 2015-06-13 DIAGNOSIS — H540 Blindness, both eyes: Secondary | ICD-10-CM | POA: Insufficient documentation

## 2015-06-13 DIAGNOSIS — I129 Hypertensive chronic kidney disease with stage 1 through stage 4 chronic kidney disease, or unspecified chronic kidney disease: Secondary | ICD-10-CM | POA: Insufficient documentation

## 2015-06-13 DIAGNOSIS — Z87891 Personal history of nicotine dependence: Secondary | ICD-10-CM | POA: Insufficient documentation

## 2015-06-13 DIAGNOSIS — N189 Chronic kidney disease, unspecified: Secondary | ICD-10-CM

## 2015-06-13 DIAGNOSIS — Z23 Encounter for immunization: Secondary | ICD-10-CM | POA: Insufficient documentation

## 2015-06-13 DIAGNOSIS — Z923 Personal history of irradiation: Secondary | ICD-10-CM | POA: Insufficient documentation

## 2015-06-13 DIAGNOSIS — C8590 Non-Hodgkin lymphoma, unspecified, unspecified site: Secondary | ICD-10-CM

## 2015-06-13 DIAGNOSIS — Z9221 Personal history of antineoplastic chemotherapy: Secondary | ICD-10-CM | POA: Diagnosis not present

## 2015-06-13 DIAGNOSIS — C8223 Follicular lymphoma grade III, unspecified, intra-abdominal lymph nodes: Secondary | ICD-10-CM | POA: Insufficient documentation

## 2015-06-13 DIAGNOSIS — C8518 Unspecified B-cell lymphoma, lymph nodes of multiple sites: Secondary | ICD-10-CM | POA: Diagnosis present

## 2015-06-13 DIAGNOSIS — Z79899 Other long term (current) drug therapy: Secondary | ICD-10-CM | POA: Insufficient documentation

## 2015-06-13 LAB — CBC WITH DIFFERENTIAL/PLATELET
BASOS ABS: 0 10*3/uL (ref 0–0.1)
Basophils Relative: 0 %
EOS PCT: 3 %
Eosinophils Absolute: 0.2 10*3/uL (ref 0–0.7)
HCT: 42.2 % (ref 40.0–52.0)
Hemoglobin: 14.2 g/dL (ref 13.0–18.0)
Lymphocytes Relative: 24 %
Lymphs Abs: 1.9 10*3/uL (ref 1.0–3.6)
MCH: 31.7 pg (ref 26.0–34.0)
MCHC: 33.6 g/dL (ref 32.0–36.0)
MCV: 94.4 fL (ref 80.0–100.0)
Monocytes Absolute: 0.7 10*3/uL (ref 0.2–1.0)
Monocytes Relative: 9 %
Neutro Abs: 5.1 10*3/uL (ref 1.4–6.5)
Neutrophils Relative %: 64 %
PLATELETS: 254 10*3/uL (ref 150–440)
RBC: 4.47 MIL/uL (ref 4.40–5.90)
RDW: 14.4 % (ref 11.5–14.5)
WBC: 7.9 10*3/uL (ref 3.8–10.6)

## 2015-06-13 LAB — COMPREHENSIVE METABOLIC PANEL
ALT: 31 U/L (ref 17–63)
AST: 23 U/L (ref 15–41)
Albumin: 3.9 g/dL (ref 3.5–5.0)
Alkaline Phosphatase: 71 U/L (ref 38–126)
Anion gap: 6 (ref 5–15)
BUN: 21 mg/dL — ABNORMAL HIGH (ref 6–20)
CHLORIDE: 101 mmol/L (ref 101–111)
CO2: 26 mmol/L (ref 22–32)
CREATININE: 1.91 mg/dL — AB (ref 0.61–1.24)
Calcium: 8.7 mg/dL — ABNORMAL LOW (ref 8.9–10.3)
GFR calc non Af Amer: 35 mL/min — ABNORMAL LOW (ref >60)
GFR, EST AFRICAN AMERICAN: 40 mL/min — AB (ref >60)
Glucose, Bld: 256 mg/dL — ABNORMAL HIGH (ref 65–99)
POTASSIUM: 4.1 mmol/L (ref 3.5–5.1)
SODIUM: 133 mmol/L — AB (ref 135–145)
Total Bilirubin: 0.3 mg/dL (ref 0.3–1.2)
Total Protein: 6.8 g/dL (ref 6.5–8.1)

## 2015-06-13 LAB — LACTATE DEHYDROGENASE: LDH: 142 U/L (ref 98–192)

## 2015-06-13 MED ORDER — PNEUMOCOCCAL VAC POLYVALENT 25 MCG/0.5ML IJ INJ
0.5000 mL | INJECTION | Freq: Once | INTRAMUSCULAR | Status: AC
  Administered 2015-06-13: 0.5 mL via INTRAMUSCULAR

## 2015-06-13 NOTE — Progress Notes (Signed)
[place] @ [facility] [phone]) [phone]  Fax:(336) [place]. OB: [birth year]  MR#: [id number]  YSH#:[id number]  Patient Care Team: Adrian Prows, MD as PCP - General (Cardiology) Lavonia Dana, MD as Consulting Physician (Internal Medicine)  CHIEF COMPLAINT:  Chief Complaint  Patient presents with  . Lymphoma   Oncology History   1.  Poorly differentiated small cleave cell lymphoma, stage III. Diagnosed in October of 1992 and was treated with chemotherapy and radiation therapy.  2.  Follicular B-cell lymphoma, grade 3.  Left inguinal lymph node biopsy was CD20 positive. Diagnosis in March 2005. Completed maintenance Rituxan in July 2007. 3.abnormal PET scan with increase uptake in mediastinal and upper abdominal area EBUS  was negative for any malignancy(March, 2016)      1.  Poorly differentiated small cleave cell lymphoma, stage III. Diagnosed in October of 1992 and was treated with chemotherapy and radiation therapy.  2.  Follicular B-cell lymphoma, grade 3.  Left inguinal lymph node biopsy was CD20 positive. Diagnosis in March 2005. Completed maintenance Rituxan in July 2007. 3.abnormal PET scan with increase uptake in mediastinal and upper abdominal area EBUS  was negative for any malignancy(March, 2016) Recent CT scan (July, 2016) off chest shows stable mediastinal adenopathy  No flowsheet data found.  INTERVAL HISTORY:  64 year old gentleman with history of blindness, history of diabetes and chronic renal disease and hypertension and previous history of follicular lymphoma came today further follow-up last PET scan so some increase uptake patient underwent a bus which negative biopsy.   Patient is here for further evaluation regarding lymphoma.  Remains asymptomatic.  Patient admitted yesterday adenopathy which needs for further follow-up.  Patient also has chronic renal failure for which being followed by nephrologist on regular  basis.  Here for further follow-up and treatment consideration REVIEW OF SYSTEMS:   GENERAL:  Feels good.  Active.  No fevers, sweats or weight loss. PERFORMANCE STATUS (ECOG):  01 HEENT:  No visual changes, runny nose, sore throat, mouth sores or tenderness. Lungs: No shortness of breath or cough.  No hemoptysis. Cardiac:  No chest pain, palpitations, orthopnea, or PND. GI:  No nausea, vomiting, diarrhea, constipation, melena or hematochezia. GU:  No urgency, frequency, dysuria, or hematuria. Musculoskeletal:  No back pain.  No joint pain.  No muscle tenderness. Extremities:  No pain or swelling. Skin:  No rashes or skin changes. Neuro:  No headache, numbness or weakness, balance or coordination issues. Endocrine:  No diabetes, thyroid issues, hot flashes or night sweats. Psych:  No mood changes, depression or anxiety. Pain:  No focal pain. Review of systems:  All other systems reviewed and found to be negative. As per HPI. Otherwise, a complete review of systems is negatve.   Significant History/PMH:   blind:    diabetes:    HTN:   PFSH: Additional Past Medical and Surgical History: Past Medical History: Diabetes, Htn, Blindness due to accident    Past Surgical History: No significant past surgical history.     Family History: No family history of colorectal cancer, breast cancer or ovarian cancer.     Social History: Does not smoke.  Does not drink.   ADVANCED DIRECTIVES:  Patient does have advance healthcare directive, Patient   does not desire to make any changes HEALTH MAINTENANCE: Social History  Substance Use Topics  . Smoking status: Former Research scientist (life sciences)  . Smokeless tobacco: None  . Alcohol Use: None     Patient does not smoke  but   Chews tobacco Allergies  Allergen Reactions  . Sulfa Antibiotics Other (See Comments)    Patient states frequent and persistent urination.    Current Outpatient Prescriptions  Medication Sig Dispense Refill  . aspirin EC 81 MG  tablet Take by mouth.    . cholecalciferol (VITAMIN D) 1000 UNITS tablet Take 1,000 Units by mouth 2 (two) times daily.    . clobetasol (TEMOVATE) 0.05 % external solution     . doxepin (SINEQUAN) 50 MG capsule Take by mouth.    . enalapril (VASOTEC) 20 MG tablet TAKE ONE TABLET BY MOUTH EVERY DAY    . glipiZIDE (GLUCOTROL) 10 MG tablet Take by mouth.    . Insulin Lispro Prot & Lispro (HUMALOG MIX 50/50 KWIKPEN) (50-50) 100 UNIT/ML Kwikpen Inject 45 units before breakfast and 28 units before supper    . metoprolol (LOPRESSOR) 100 MG tablet Take by mouth.    Marland Kitchen NIFEdipine (PROCARDIA XL/ADALAT-CC) 60 MG 24 hr tablet Take by mouth.    Marland Kitchen omeprazole (PRILOSEC) 20 MG capsule Take by mouth.    . prednisoLONE sodium phosphate (INFLAMASE FORTE) 1 % ophthalmic solution Apply 1 % to eye.    . sitaGLIPtin (JANUVIA) 50 MG tablet TAKE ONE TABLET BY MOUTH EVERY DAY     Current Facility-Administered Medications  Medication Dose Route Frequency Provider Last Rate Last Dose  . pneumococcal 23 valent vaccine (PNU-IMMUNE) injection 0.5 mL  0.5 mL Intramuscular Once Forest Gleason, MD        OBJECTIVE:  Filed Vitals:   06/13/15 1555  BP: 142/92  Pulse: 63  Temp: 96.7 F (35.9 C)     Body mass index is 28.97 kg/(m^2).    ECOG FS:1 - Symptomatic but completely ambulatory  PHYSICAL EXAM: GENERAL:  Well developed, well nourished, sitting comfortably in the exam room in no acute distress. MENTAL STATUS:  Alert and oriented to person, place and time.  ENT:  Oropharynx clear without lesion.  Tongue normal. Mucous membranes moist.  RESPIRATORY:  Clear to auscultation without rales, wheezes or rhonchi. CARDIOVASCULAR:  Regular rate and rhythm without murmur, rub or gallop. BREAST:  Right breast without masses, skin changes or nipple discharge.  Left breast without masses, skin changes or nipple discharge. ABDOMEN:  Soft, non-tender, with active bowel sounds, and no hepatosplenomegaly.  No masses. BACK:  No CVA  tenderness.  No tenderness on percussion of the back or rib cage. SKIN:  No rashes, ulcers or lesions. EXTREMITIES: No edema, no skin discoloration or tenderness.  No palpable cords. LYMPH NODES: No palpable cervical, supraclavicular, axillary or inguinal adenopathy  NEUROLOGICAL: Unremarkable. PSYCH:  Appropriate.   LAB RESULTS:  Appointment on 06/13/2015  Component Date Value Ref Range Status  . WBC 06/13/2015 7.9  3.8 - 10.6 K/uL Final  . RBC 06/13/2015 4.47  4.40 - 5.90 MIL/uL Final  . Hemoglobin 06/13/2015 14.2  13.0 - 18.0 g/dL Final  . HCT 06/13/2015 42.2  40.0 - 52.0 % Final  . MCV 06/13/2015 94.4  80.0 - 100.0 fL Final  . MCH 06/13/2015 31.7  26.0 - 34.0 pg Final  . MCHC 06/13/2015 33.6  32.0 - 36.0 g/dL Final  . RDW 06/13/2015 14.4  11.5 - 14.5 % Final  . Platelets 06/13/2015 254  150 - 440 K/uL Final  . Neutrophils Relative % 06/13/2015 64   Final  . Neutro Abs 06/13/2015 5.1  1.4 - 6.5 K/uL Final  . Lymphocytes Relative 06/13/2015 24   Final  . Lymphs Abs  06/13/2015 1.9  1.0 - 3.6 K/uL Final  . Monocytes Relative 06/13/2015 9   Final  . Monocytes Absolute 06/13/2015 0.7  0.2 - 1.0 K/uL Final  . Eosinophils Relative 06/13/2015 3   Final  . Eosinophils Absolute 06/13/2015 0.2  0 - 0.7 K/uL Final  . Basophils Relative 06/13/2015 0   Final  . Basophils Absolute 06/13/2015 0.0  0 - 0.1 K/uL Final  . Sodium 06/13/2015 133* 135 - 145 mmol/L Final  . Potassium 06/13/2015 4.1  3.5 - 5.1 mmol/L Final  . Chloride 06/13/2015 101  101 - 111 mmol/L Final  . CO2 06/13/2015 26  22 - 32 mmol/L Final  . Glucose, Bld 06/13/2015 256* 65 - 99 mg/dL Final  . BUN 06/13/2015 21* 6 - 20 mg/dL Final  . Creatinine, Ser 06/13/2015 1.91* 0.61 - 1.24 mg/dL Final  . Calcium 06/13/2015 8.7* 8.9 - 10.3 mg/dL Final  . Total Protein 06/13/2015 6.8  6.5 - 8.1 g/dL Final  . Albumin 06/13/2015 3.9  3.5 - 5.0 g/dL Final  . AST 06/13/2015 23  15 - 41 U/L Final  . ALT 06/13/2015 31  17 - 63 U/L Final    . Alkaline Phosphatase 06/13/2015 71  38 - 126 U/L Final  . Total Bilirubin 06/13/2015 0.3  0.3 - 1.2 mg/dL Final  . GFR calc non Af Amer 06/13/2015 35* >60 mL/min Final  . GFR calc Af Amer 06/13/2015 40* >60 mL/min Final   Comment: (NOTE) The eGFR has been calculated using the CKD EPI equation. This calculation has not been validated in all clinical situations. eGFR's persistently <60 mL/min signify possible Chronic Kidney Disease.   . Anion gap 06/13/2015 6  5 - 15 Final  . LDH 06/13/2015 142  98 - 192 U/L Final      STUDIES: No results found.  ASSESSMENT: Follicular lymphoma treated with chemotherapy and Rituxan Last PET scan was abnormal in February but repeat CT scan shows stable mediastinal and upper abdominal adenopathy patient remains asymptomatic This and underwent's endoscopy bronchial ultrasound and biopsy of subcarinal lymph node was negative Will continue to follow this patient If patient develops any symptoms or develops any palpable lymph node will be biopsied. Continue observation slow progression of disease as been suspected MEDICAL DECISION MAKING:  All lab data has been reviewed.  Patient is due for another CT scan or PET scan.  I would prefer PET scan because of patient's renal insufficiency.  But if insurance rejects PET scan and CT scan of chest abdomen pelvis with oral contrast can be done and if there is any abnormality detected PET scan can be done. Reevaluate patient in 4 months.  Patient also got pneumonia vaccine today   No matching staging information was found for the patient.  Forest Gleason, MD   06/13/2015 4:27 PM

## 2015-07-10 ENCOUNTER — Encounter
Admission: RE | Admit: 2015-07-10 | Discharge: 2015-07-10 | Disposition: A | Discharge status: Home / Self Care | Payer: Medicare Other | Source: Ambulatory Visit | Attending: Oncology | Admitting: Oncology

## 2015-07-10 DIAGNOSIS — C8590 Non-Hodgkin lymphoma, unspecified, unspecified site: Secondary | ICD-10-CM

## 2015-07-10 LAB — GLUCOSE, CAPILLARY: GLUCOSE-CAPILLARY: 135 mg/dL — AB (ref 65–99)

## 2015-07-10 MED ORDER — FLUDEOXYGLUCOSE F - 18 (FDG) INJECTION
12.8300 | Freq: Once | INTRAVENOUS | Status: AC | PRN
  Administered 2015-07-10: 12.83 via INTRAVENOUS

## 2015-10-08 ENCOUNTER — Other Ambulatory Visit: Payer: Medicare Other

## 2015-10-08 ENCOUNTER — Ambulatory Visit: Payer: Medicare Other | Admitting: Oncology

## 2015-11-28 ENCOUNTER — Other Ambulatory Visit: Payer: Medicare Other

## 2015-11-28 ENCOUNTER — Inpatient Hospital Stay: Payer: Medicare Other | Attending: Oncology

## 2015-11-28 ENCOUNTER — Encounter: Payer: Self-pay | Admitting: Oncology

## 2015-11-28 ENCOUNTER — Ambulatory Visit: Payer: Medicare Other | Admitting: Oncology

## 2015-11-28 ENCOUNTER — Inpatient Hospital Stay (HOSPITAL_BASED_OUTPATIENT_CLINIC_OR_DEPARTMENT_OTHER): Payer: Medicare Other | Admitting: Oncology

## 2015-11-28 VITALS — BP 147/83 | HR 67 | Temp 96.7°F | Resp 18 | Wt 182.1 lb

## 2015-11-28 DIAGNOSIS — H540 Blindness, both eyes: Secondary | ICD-10-CM | POA: Diagnosis not present

## 2015-11-28 DIAGNOSIS — Z87891 Personal history of nicotine dependence: Secondary | ICD-10-CM | POA: Insufficient documentation

## 2015-11-28 DIAGNOSIS — C8590 Non-Hodgkin lymphoma, unspecified, unspecified site: Secondary | ICD-10-CM

## 2015-11-28 DIAGNOSIS — Z7952 Long term (current) use of systemic steroids: Secondary | ICD-10-CM | POA: Diagnosis not present

## 2015-11-28 DIAGNOSIS — F17220 Nicotine dependence, chewing tobacco, uncomplicated: Secondary | ICD-10-CM | POA: Diagnosis not present

## 2015-11-28 DIAGNOSIS — Z7982 Long term (current) use of aspirin: Secondary | ICD-10-CM | POA: Insufficient documentation

## 2015-11-28 DIAGNOSIS — C8518 Unspecified B-cell lymphoma, lymph nodes of multiple sites: Secondary | ICD-10-CM | POA: Diagnosis not present

## 2015-11-28 DIAGNOSIS — Z79899 Other long term (current) drug therapy: Secondary | ICD-10-CM | POA: Insufficient documentation

## 2015-11-28 DIAGNOSIS — I129 Hypertensive chronic kidney disease with stage 1 through stage 4 chronic kidney disease, or unspecified chronic kidney disease: Secondary | ICD-10-CM | POA: Insufficient documentation

## 2015-11-28 DIAGNOSIS — Z9221 Personal history of antineoplastic chemotherapy: Secondary | ICD-10-CM

## 2015-11-28 DIAGNOSIS — N189 Chronic kidney disease, unspecified: Secondary | ICD-10-CM | POA: Insufficient documentation

## 2015-11-28 DIAGNOSIS — E1122 Type 2 diabetes mellitus with diabetic chronic kidney disease: Secondary | ICD-10-CM | POA: Diagnosis not present

## 2015-11-28 DIAGNOSIS — Z923 Personal history of irradiation: Secondary | ICD-10-CM

## 2015-11-28 LAB — CBC WITH DIFFERENTIAL/PLATELET
Basophils Absolute: 0 10*3/uL (ref 0–0.1)
Basophils Relative: 0 %
Eosinophils Absolute: 0.2 10*3/uL (ref 0–0.7)
Eosinophils Relative: 3 %
HCT: 40.4 % (ref 40.0–52.0)
Hemoglobin: 13.9 g/dL (ref 13.0–18.0)
Lymphocytes Relative: 19 %
Lymphs Abs: 1.7 10*3/uL (ref 1.0–3.6)
MCH: 32.6 pg (ref 26.0–34.0)
MCHC: 34.5 g/dL (ref 32.0–36.0)
MCV: 94.6 fL (ref 80.0–100.0)
Monocytes Absolute: 0.7 10*3/uL (ref 0.2–1.0)
Monocytes Relative: 8 %
Neutro Abs: 6.3 10*3/uL (ref 1.4–6.5)
Neutrophils Relative %: 70 %
Platelets: 243 10*3/uL (ref 150–440)
RBC: 4.27 MIL/uL — ABNORMAL LOW (ref 4.40–5.90)
RDW: 14.3 % (ref 11.5–14.5)
WBC: 9 10*3/uL (ref 3.8–10.6)

## 2015-11-28 LAB — COMPREHENSIVE METABOLIC PANEL WITH GFR
ALT: 28 U/L (ref 17–63)
AST: 20 U/L (ref 15–41)
Albumin: 4.1 g/dL (ref 3.5–5.0)
Alkaline Phosphatase: 66 U/L (ref 38–126)
Anion gap: 5 (ref 5–15)
BUN: 26 mg/dL — ABNORMAL HIGH (ref 6–20)
CO2: 26 mmol/L (ref 22–32)
Calcium: 8.8 mg/dL — ABNORMAL LOW (ref 8.9–10.3)
Chloride: 103 mmol/L (ref 101–111)
Creatinine, Ser: 1.87 mg/dL — ABNORMAL HIGH (ref 0.61–1.24)
GFR calc Af Amer: 42 mL/min — ABNORMAL LOW
GFR calc non Af Amer: 36 mL/min — ABNORMAL LOW
Glucose, Bld: 218 mg/dL — ABNORMAL HIGH (ref 65–99)
Potassium: 4.7 mmol/L (ref 3.5–5.1)
Sodium: 134 mmol/L — ABNORMAL LOW (ref 135–145)
Total Bilirubin: 0.3 mg/dL (ref 0.3–1.2)
Total Protein: 7.1 g/dL (ref 6.5–8.1)

## 2015-11-28 LAB — LACTATE DEHYDROGENASE: LDH: 143 U/L (ref 98–192)

## 2015-12-02 ENCOUNTER — Encounter: Payer: Self-pay | Admitting: Oncology

## 2015-12-02 DIAGNOSIS — F329 Major depressive disorder, single episode, unspecified: Secondary | ICD-10-CM | POA: Insufficient documentation

## 2015-12-02 DIAGNOSIS — F32A Depression, unspecified: Secondary | ICD-10-CM | POA: Insufficient documentation

## 2015-12-02 DIAGNOSIS — E119 Type 2 diabetes mellitus without complications: Secondary | ICD-10-CM | POA: Insufficient documentation

## 2015-12-02 NOTE — Progress Notes (Signed)
[place] @ [facility] [phone]  Fax:(336) [place]. OB: 06/27/1951  MR#: [id number]  BCW#:[id number]  Patient Care Team: Ebbie Ridge, MD as PCP - General (Cardiology) Lavonia Dana, MD as Consulting Physician (Internal Medicine)  CHIEF COMPLAINT:  Chief Complaint  Patient presents with  . Lymphoma   Oncology History   1.  Poorly differentiated small cleave cell lymphoma, stage III. Diagnosed in October of 1992 and was treated with chemotherapy and radiation therapy.  2.  Follicular B-cell lymphoma, grade 3.  Left inguinal lymph node biopsy was CD20 positive. Diagnosis in March 2005. Completed maintenance Rituxan in July 2007. 3.abnormal PET scan with increase uptake in mediastinal and upper abdominal area EBUS  was negative for any malignancy(March, 2016)      1.  Poorly differentiated small cleave cell lymphoma, stage III. Diagnosed in October of 1992 and was treated with chemotherapy and radiation therapy.  2.  Follicular B-cell lymphoma, grade 3.  Left inguinal lymph node biopsy was CD20 positive. Diagnosis in March 2005. Completed maintenance Rituxan in July 2007. 3.abnormal PET scan with increase uptake in mediastinal and upper abdominal area EBUS  was negative for any malignancy(March, 2016) Recent CT scan (July, 2016) off chest shows stable mediastinal adenopathy  No flowsheet data found.  INTERVAL HISTORY:  65 year old gentleman with history of blindness, history of diabetes and chronic renal disease and hypertension and previous history of follicular lymphoma came today further follow-up last PET scan so some increase uptake patient underwent a bus which negative biopsy.   Patient is here for further evaluation regarding lymphoma.  Remains asymptomatic.  Patient admitted yesterday adenopathy which needs for further follow-up.  Patient also has chronic renal failure for which being followed by nephrologist on regular  basis. Patient is here for ongoing evaluation and treatment consideration in follow-up regarding lymphoma.  No chills.  No fever.  No night sweats.  Here for further follow-up and treatment consideration REVIEW OF SYSTEMS:   GENERAL:  Feels good.  Active.  No fevers, sweats or weight loss. PERFORMANCE STATUS (ECOG):  01 HEENT:  No visual changes, runny nose, sore throat, mouth sores or tenderness. Lungs: No shortness of breath or cough.  No hemoptysis. Cardiac:  No chest pain, palpitations, orthopnea, or PND. GI:  No nausea, vomiting, diarrhea, constipation, melena or hematochezia. GU:  No urgency, frequency, dysuria, or hematuria. Musculoskeletal:  No back pain.  No joint pain.  No muscle tenderness. Extremities:  No pain or swelling. Skin:  No rashes or skin changes. Neuro:  No headache, numbness or weakness, balance or coordination issues. Endocrine:  No diabetes, thyroid issues, hot flashes or night sweats. Psych:  No mood changes, depression or anxiety. Pain:  No focal pain. Review of systems:  All other systems reviewed and found to be negative. As per HPI. Otherwise, a complete review of systems is negatve.   Significant History/PMH:   blind:    diabetes:    HTN:   PFSH: Additional Past Medical and Surgical History: Past Medical History: Diabetes, Htn, Blindness due to accident    Past Surgical History: No significant past surgical history.     Family History: No family history of colorectal cancer, breast cancer or ovarian cancer.     Social History: Does not smoke.  Does not drink.   ADVANCED DIRECTIVES:  Patient does have advance healthcare directive, Patient   does not desire to make any changes HEALTH MAINTENANCE: Social History  Substance Use Topics  .  Smoking status: Former Research scientist (life sciences)  . Smokeless tobacco: None  . Alcohol Use: None     Patient does not smoke but   Chews tobacco Allergies  Allergen Reactions  . Sulfa Antibiotics Other (See Comments)     Patient states frequent and persistent urination.    Current Outpatient Prescriptions  Medication Sig Dispense Refill  . aspirin EC 81 MG tablet Take by mouth.    . cholecalciferol (VITAMIN D) 1000 UNITS tablet Take 1,000 Units by mouth 2 (two) times daily.    . clobetasol (TEMOVATE) 0.05 % external solution     . doxepin (SINEQUAN) 50 MG capsule Take by mouth.    . enalapril (VASOTEC) 20 MG tablet TAKE ONE TABLET BY MOUTH EVERY DAY    . glipiZIDE (GLUCOTROL) 10 MG tablet Take by mouth.    . Insulin Lispro Prot & Lispro (HUMALOG MIX 50/50 KWIKPEN) (50-50) 100 UNIT/ML Kwikpen Inject 45 units before breakfast and 28 units before supper    . metoprolol (LOPRESSOR) 100 MG tablet Take by mouth.    Marland Kitchen NIFEdipine (PROCARDIA XL/ADALAT-CC) 60 MG 24 hr tablet Take by mouth.    Marland Kitchen omeprazole (PRILOSEC) 20 MG capsule Take by mouth.    . prednisoLONE sodium phosphate (INFLAMASE FORTE) 1 % ophthalmic solution Apply 1 % to eye.    . sitaGLIPtin (JANUVIA) 50 MG tablet TAKE ONE TABLET BY MOUTH EVERY DAY     No current facility-administered medications for this visit.    OBJECTIVE:  Filed Vitals:   11/28/15 1609  BP: 147/83  Pulse: 67  Temp: 96.7 F (35.9 C)  Resp: 18     Body mass index is 28.51 kg/(m^2).    ECOG FS:1 - Symptomatic but completely ambulatory  PHYSICAL EXAM: GENERAL:  Well developed, well nourished, sitting comfortably in the exam room in no acute distress. MENTAL STATUS:  Alert and oriented to person, place and time.  ENT:  Oropharynx clear without lesion.  Tongue normal. Mucous membranes moist.  RESPIRATORY:  Clear to auscultation without rales, wheezes or rhonchi. CARDIOVASCULAR:  Regular rate and rhythm without murmur, rub or gallop. BREAST:  Right breast without masses, skin changes or nipple discharge.  Left breast without masses, skin changes or nipple discharge. ABDOMEN:  Soft, non-tender, with active bowel sounds, and no hepatosplenomegaly.  No masses. BACK:  No CVA  tenderness.  No tenderness on percussion of the back or rib cage. SKIN:  No rashes, ulcers or lesions. EXTREMITIES: No edema, no skin discoloration or tenderness.  No palpable cords. LYMPH NODES: No palpable cervical, supraclavicular, axillary or inguinal adenopathy  NEUROLOGICAL: Unremarkable. PSYCH:  Appropriate.   LAB RESULTS:  Appointment on 11/28/2015  Component Date Value Ref Range Status  . WBC 11/28/2015 9.0  3.8 - 10.6 K/uL Final  . RBC 11/28/2015 4.27* 4.40 - 5.90 MIL/uL Final  . Hemoglobin 11/28/2015 13.9  13.0 - 18.0 g/dL Final  . HCT 11/28/2015 40.4  40.0 - 52.0 % Final  . MCV 11/28/2015 94.6  80.0 - 100.0 fL Final  . MCH 11/28/2015 32.6  26.0 - 34.0 pg Final  . MCHC 11/28/2015 34.5  32.0 - 36.0 g/dL Final  . RDW 11/28/2015 14.3  11.5 - 14.5 % Final  . Platelets 11/28/2015 243  150 - 440 K/uL Final  . Neutrophils Relative % 11/28/2015 70   Final  . Neutro Abs 11/28/2015 6.3  1.4 - 6.5 K/uL Final  . Lymphocytes Relative 11/28/2015 19   Final  . Lymphs Abs 11/28/2015 1.7  1.0 -  3.6 K/uL Final  . Monocytes Relative 11/28/2015 8   Final  . Monocytes Absolute 11/28/2015 0.7  0.2 - 1.0 K/uL Final  . Eosinophils Relative 11/28/2015 3   Final  . Eosinophils Absolute 11/28/2015 0.2  0 - 0.7 K/uL Final  . Basophils Relative 11/28/2015 0   Final  . Basophils Absolute 11/28/2015 0.0  0 - 0.1 K/uL Final  . Sodium 11/28/2015 134* 135 - 145 mmol/L Final  . Potassium 11/28/2015 4.7  3.5 - 5.1 mmol/L Final  . Chloride 11/28/2015 103  101 - 111 mmol/L Final  . CO2 11/28/2015 26  22 - 32 mmol/L Final  . Glucose, Bld 11/28/2015 218* 65 - 99 mg/dL Final  . BUN 11/28/2015 26* 6 - 20 mg/dL Final  . Creatinine, Ser 11/28/2015 1.87* 0.61 - 1.24 mg/dL Final  . Calcium 11/28/2015 8.8* 8.9 - 10.3 mg/dL Final  . Total Protein 11/28/2015 7.1  6.5 - 8.1 g/dL Final  . Albumin 11/28/2015 4.1  3.5 - 5.0 g/dL Final  . AST 11/28/2015 20  15 - 41 U/L Final  . ALT 11/28/2015 28  17 - 63 U/L Final   . Alkaline Phosphatase 11/28/2015 66  38 - 126 U/L Final  . Total Bilirubin 11/28/2015 0.3  0.3 - 1.2 mg/dL Final  . GFR calc non Af Amer 11/28/2015 36* >60 mL/min Final  . GFR calc Af Amer 11/28/2015 42* >60 mL/min Final   Comment: (NOTE) The eGFR has been calculated using the CKD EPI equation. This calculation has not been validated in all clinical situations. eGFR's persistently <60 mL/min signify possible Chronic Kidney Disease.   . Anion gap 11/28/2015 5  5 - 15 Final  . LDH 11/28/2015 143  98 - 192 U/L Final      STUDIES: No results found.  ASSESSMENT: Follicular lymphoma treated with chemotherapy and Rituxan No evidence of recurrent or progressive disease at present time  Diabetes and renal insufficiency being managed by nephrologist and endocrinologist MEDICAL DECISION MAKING:  On clinical grounds there is no evidence of recurrent or progressive disease.  All lab data has been reviewed. Repeat PET scan in November of 2017 Because of renal insufficiency CT scan with contrast cannot be done She would be followed by Dr. B and he is aware of my planned retirement  No matching staging information was found for the patient.  Forest Gleason, MD   12/02/2015 10:20 AM

## 2016-06-20 ENCOUNTER — Ambulatory Visit: Payer: Medicare Other

## 2016-06-23 ENCOUNTER — Other Ambulatory Visit: Payer: Medicare Other

## 2016-06-23 ENCOUNTER — Ambulatory Visit: Payer: Medicare Other | Admitting: Internal Medicine

## 2016-07-11 ENCOUNTER — Ambulatory Visit: Payer: Medicare Other

## 2016-07-11 ENCOUNTER — Ambulatory Visit
Admission: RE | Admit: 2016-07-11 | Discharge: 2016-07-11 | Disposition: A | Discharge status: Home / Self Care | Payer: Medicare Other | Source: Ambulatory Visit | Attending: Oncology | Admitting: Oncology

## 2016-07-11 DIAGNOSIS — R911 Solitary pulmonary nodule: Secondary | ICD-10-CM | POA: Insufficient documentation

## 2016-07-11 DIAGNOSIS — C8590 Non-Hodgkin lymphoma, unspecified, unspecified site: Secondary | ICD-10-CM | POA: Insufficient documentation

## 2016-07-11 LAB — GLUCOSE, CAPILLARY: Glucose-Capillary: 169 mg/dL — ABNORMAL HIGH (ref 65–99)

## 2016-07-11 MED ORDER — FLUDEOXYGLUCOSE F - 18 (FDG) INJECTION
12.6100 | Freq: Once | INTRAVENOUS | Status: AC | PRN
  Administered 2016-07-11: 12.61 via INTRAVENOUS

## 2016-07-16 ENCOUNTER — Ambulatory Visit: Payer: Medicare Other | Admitting: Internal Medicine

## 2016-07-16 ENCOUNTER — Other Ambulatory Visit: Payer: Medicare Other

## 2016-07-23 ENCOUNTER — Inpatient Hospital Stay: Payer: Medicare Other

## 2016-07-23 ENCOUNTER — Inpatient Hospital Stay: Payer: Medicare Other | Attending: Internal Medicine | Admitting: Internal Medicine

## 2016-07-23 VITALS — BP 101/65 | HR 67 | Temp 97.5°F | Resp 18 | Ht 67.0 in | Wt 188.0 lb

## 2016-07-23 DIAGNOSIS — Z923 Personal history of irradiation: Secondary | ICD-10-CM | POA: Insufficient documentation

## 2016-07-23 DIAGNOSIS — Z79899 Other long term (current) drug therapy: Secondary | ICD-10-CM | POA: Diagnosis not present

## 2016-07-23 DIAGNOSIS — C8590 Non-Hodgkin lymphoma, unspecified, unspecified site: Secondary | ICD-10-CM

## 2016-07-23 DIAGNOSIS — Z8572 Personal history of non-Hodgkin lymphomas: Secondary | ICD-10-CM | POA: Insufficient documentation

## 2016-07-23 DIAGNOSIS — N183 Chronic kidney disease, stage 3 (moderate): Secondary | ICD-10-CM | POA: Insufficient documentation

## 2016-07-23 DIAGNOSIS — H543 Unqualified visual loss, both eyes: Secondary | ICD-10-CM | POA: Diagnosis not present

## 2016-07-23 DIAGNOSIS — I129 Hypertensive chronic kidney disease with stage 1 through stage 4 chronic kidney disease, or unspecified chronic kidney disease: Secondary | ICD-10-CM | POA: Diagnosis not present

## 2016-07-23 DIAGNOSIS — Z794 Long term (current) use of insulin: Secondary | ICD-10-CM

## 2016-07-23 DIAGNOSIS — Z7982 Long term (current) use of aspirin: Secondary | ICD-10-CM | POA: Diagnosis not present

## 2016-07-23 DIAGNOSIS — Z87828 Personal history of other (healed) physical injury and trauma: Secondary | ICD-10-CM | POA: Diagnosis not present

## 2016-07-23 DIAGNOSIS — Z9221 Personal history of antineoplastic chemotherapy: Secondary | ICD-10-CM | POA: Diagnosis not present

## 2016-07-23 DIAGNOSIS — C8223 Follicular lymphoma grade III, unspecified, intra-abdominal lymph nodes: Secondary | ICD-10-CM

## 2016-07-23 DIAGNOSIS — Z87891 Personal history of nicotine dependence: Secondary | ICD-10-CM | POA: Diagnosis not present

## 2016-07-23 LAB — CBC WITH DIFFERENTIAL/PLATELET
BASOS ABS: 0.1 10*3/uL (ref 0–0.1)
Basophils Relative: 1 %
Eosinophils Absolute: 0.3 10*3/uL (ref 0–0.7)
Eosinophils Relative: 3 %
HEMATOCRIT: 41.7 % (ref 40.0–52.0)
HEMOGLOBIN: 14.3 g/dL (ref 13.0–18.0)
Lymphocytes Relative: 21 %
Lymphs Abs: 2 10*3/uL (ref 1.0–3.6)
MCH: 32.2 pg (ref 26.0–34.0)
MCHC: 34.3 g/dL (ref 32.0–36.0)
MCV: 94 fL (ref 80.0–100.0)
MONO ABS: 0.9 10*3/uL (ref 0.2–1.0)
MONOS PCT: 9 %
NEUTROS ABS: 6.1 10*3/uL (ref 1.4–6.5)
NEUTROS PCT: 66 %
Platelets: 224 10*3/uL (ref 150–440)
RBC: 4.43 MIL/uL (ref 4.40–5.90)
RDW: 13.8 % (ref 11.5–14.5)
WBC: 9.5 10*3/uL (ref 3.8–10.6)

## 2016-07-23 LAB — COMPREHENSIVE METABOLIC PANEL
ALK PHOS: 61 U/L (ref 38–126)
ALT: 37 U/L (ref 17–63)
AST: 32 U/L (ref 15–41)
Albumin: 4 g/dL (ref 3.5–5.0)
Anion gap: 7 (ref 5–15)
BILIRUBIN TOTAL: 0.5 mg/dL (ref 0.3–1.2)
BUN: 25 mg/dL — ABNORMAL HIGH (ref 6–20)
CALCIUM: 9.1 mg/dL (ref 8.9–10.3)
CO2: 22 mmol/L (ref 22–32)
Chloride: 107 mmol/L (ref 101–111)
Creatinine, Ser: 1.8 mg/dL — ABNORMAL HIGH (ref 0.61–1.24)
GFR calc Af Amer: 43 mL/min — ABNORMAL LOW (ref >60)
GFR, EST NON AFRICAN AMERICAN: 37 mL/min — AB (ref >60)
GLUCOSE: 79 mg/dL (ref 65–99)
POTASSIUM: 3.9 mmol/L (ref 3.5–5.1)
Sodium: 136 mmol/L (ref 135–145)
TOTAL PROTEIN: 7.1 g/dL (ref 6.5–8.1)

## 2016-07-23 LAB — LACTATE DEHYDROGENASE: LDH: 162 U/L (ref 98–192)
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
## 2016-07-23 NOTE — Assessment & Plan Note (Addendum)
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 Clinically no evidence of recurrence. PET scan December 2017- compared to December 2016 not significantly different. Subcarinal lymph node-positive previously biopsied negative. Recommend follow-up PET scan in 1 year.  # CKD stage III/diabetes- stable creatinine.  # # I reviewed the blood work- with the patient in detail; also reviewed the imaging independently [as summarized above]; and with the patient in detail.   # follow up in 12 months/labs scan few days prior.   # 25 minutes face-to-face with the patient discussing the above plan of care; more than 50% of time spent on prognosis/ natural history; counseling and coordination.

## 2016-07-23 NOTE — Progress Notes (Signed)
Patient here for f/u for lymphoma. Pt denies any concerns today.

## 2016-11-18 ENCOUNTER — Encounter: Payer: Self-pay | Admitting: Emergency Medicine

## 2016-11-18 DIAGNOSIS — J040 Acute laryngitis: Secondary | ICD-10-CM | POA: Insufficient documentation

## 2016-11-18 DIAGNOSIS — Z87891 Personal history of nicotine dependence: Secondary | ICD-10-CM | POA: Insufficient documentation

## 2016-11-18 DIAGNOSIS — Z794 Long term (current) use of insulin: Secondary | ICD-10-CM | POA: Insufficient documentation

## 2016-11-18 DIAGNOSIS — J209 Acute bronchitis, unspecified: Secondary | ICD-10-CM | POA: Diagnosis not present

## 2016-11-18 DIAGNOSIS — N183 Chronic kidney disease, stage 3 (moderate): Secondary | ICD-10-CM | POA: Insufficient documentation

## 2016-11-18 DIAGNOSIS — E1122 Type 2 diabetes mellitus with diabetic chronic kidney disease: Secondary | ICD-10-CM | POA: Insufficient documentation

## 2016-11-18 DIAGNOSIS — Z7982 Long term (current) use of aspirin: Secondary | ICD-10-CM | POA: Diagnosis not present

## 2016-11-18 DIAGNOSIS — Z8572 Personal history of non-Hodgkin lymphomas: Secondary | ICD-10-CM | POA: Diagnosis not present

## 2016-11-18 DIAGNOSIS — Z79899 Other long term (current) drug therapy: Secondary | ICD-10-CM | POA: Insufficient documentation

## 2016-11-18 DIAGNOSIS — I129 Hypertensive chronic kidney disease with stage 1 through stage 4 chronic kidney disease, or unspecified chronic kidney disease: Secondary | ICD-10-CM | POA: Insufficient documentation

## 2016-11-18 DIAGNOSIS — R05 Cough: Secondary | ICD-10-CM | POA: Diagnosis present

## 2016-11-18 NOTE — ED Triage Notes (Addendum)
Patient ambulatory to triage with steady gait, without difficulty or distress noted; pt reports nonprod cough x 5 days with runny nose & congestion; st has been coughing so much that he is having a right sided HA and lower back pain; took zpak and mucinex without relief

## 2016-11-19 ENCOUNTER — Emergency Department
Admission: EM | Admit: 2016-11-19 | Discharge: 2016-11-19 | Disposition: A | Discharge status: Home / Self Care | Payer: Medicare Other | Attending: Emergency Medicine | Admitting: Emergency Medicine

## 2016-11-19 ENCOUNTER — Emergency Department: Payer: Medicare Other

## 2016-11-19 DIAGNOSIS — J040 Acute laryngitis: Secondary | ICD-10-CM

## 2016-11-19 DIAGNOSIS — J209 Acute bronchitis, unspecified: Secondary | ICD-10-CM

## 2016-11-19 MED ORDER — PREDNISONE 20 MG PO TABS
60.0000 mg | ORAL_TABLET | Freq: Once | ORAL | Status: AC
  Administered 2016-11-19: 60 mg via ORAL
  Filled 2016-11-19: qty 3

## 2016-11-19 MED ORDER — BENZONATATE 100 MG PO CAPS: 100.0000 mg | ORAL_CAPSULE | Freq: Three times a day (TID) | ORAL | 0 refills | Status: DC | PRN

## 2016-11-19 MED ORDER — AMOXICILLIN-POT CLAVULANATE 875-125 MG PO TABS: 1.0000 | ORAL_TABLET | Freq: Two times a day (BID) | ORAL | 0 refills | Status: AC

## 2016-11-19 MED ORDER — HYDROCOD POLST-CPM POLST ER 10-8 MG/5ML PO SUER: 5.0000 mL | Freq: Two times a day (BID) | ORAL | 0 refills | Status: DC | PRN

## 2016-11-19 MED ORDER — HYDROCOD POLST-CPM POLST ER 10-8 MG/5ML PO SUER
5.0000 mL | Freq: Once | ORAL | Status: AC
  Administered 2016-11-19: 5 mL via ORAL
  Filled 2016-11-19: qty 5
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
## 2016-11-19 NOTE — ED Notes (Signed)
Pt present to ED 24 c/o non-productive cough for the past nine days; pt reports not shortness of breath when not coughing, no chest pain, no light headedness, dizziness, or any neuro changes; pt denies any pain at this time; pt states coughing spells can last up to 4-5 minutes and that is when he feels short of breath. Pt's lung sounds are clear in all fields, pt has good peripheral pulses and no edema; pt's VS are WDL; pt does not appear to be in distress at this time; pt is awake, alert and oriented x4 and able to speak in complete sentences.

## 2017-04-06 ENCOUNTER — Telehealth: Payer: Self-pay | Admitting: *Deleted

## 2017-04-06 ENCOUNTER — Inpatient Hospital Stay: Payer: Medicare Other | Attending: Internal Medicine | Admitting: Internal Medicine

## 2017-04-06 VITALS — BP 134/82 | HR 71 | Temp 97.5°F | Resp 20 | Ht 67.0 in | Wt 180.0 lb

## 2017-04-06 DIAGNOSIS — I129 Hypertensive chronic kidney disease with stage 1 through stage 4 chronic kidney disease, or unspecified chronic kidney disease: Secondary | ICD-10-CM | POA: Insufficient documentation

## 2017-04-06 DIAGNOSIS — Z9221 Personal history of antineoplastic chemotherapy: Secondary | ICD-10-CM | POA: Insufficient documentation

## 2017-04-06 DIAGNOSIS — E1122 Type 2 diabetes mellitus with diabetic chronic kidney disease: Secondary | ICD-10-CM | POA: Diagnosis not present

## 2017-04-06 DIAGNOSIS — Z8572 Personal history of non-Hodgkin lymphomas: Secondary | ICD-10-CM | POA: Diagnosis not present

## 2017-04-06 DIAGNOSIS — N183 Chronic kidney disease, stage 3 (moderate): Secondary | ICD-10-CM

## 2017-04-06 DIAGNOSIS — Z794 Long term (current) use of insulin: Secondary | ICD-10-CM | POA: Diagnosis not present

## 2017-04-06 DIAGNOSIS — Z79899 Other long term (current) drug therapy: Secondary | ICD-10-CM | POA: Diagnosis not present

## 2017-04-06 DIAGNOSIS — Z923 Personal history of irradiation: Secondary | ICD-10-CM | POA: Diagnosis not present

## 2017-04-06 DIAGNOSIS — C8223 Follicular lymphoma grade III, unspecified, intra-abdominal lymph nodes: Secondary | ICD-10-CM

## 2017-04-06 DIAGNOSIS — Z7982 Long term (current) use of aspirin: Secondary | ICD-10-CM | POA: Diagnosis not present

## 2017-04-06 DIAGNOSIS — R1909 Other intra-abdominal and pelvic swelling, mass and lump: Secondary | ICD-10-CM | POA: Diagnosis not present

## 2017-04-06 NOTE — Progress Notes (Signed)
Patient here for lymphoma follow-up. He reports a nodule in abdomin lateral - right to the umbilicus.

## 2017-04-06 NOTE — Assessment & Plan Note (Addendum)
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 No clinical evidence of recurrence. PET scan December 2017- compared to December 2016 not significantly different. Subcarinal lymph node-positive previously biopsied negative. Will repeat PET scan in January  # Abdominal mass- mass has decreased in size in 2 days. Unclear etiology. Patient gives injections of insulin in same area but lump does not feel hard.  Low suspicion for recurrence of lymphoma at this time. Could consider imaging or biopsy if area changes. Will monitor at this time.   # CKD stage III/diabetes- will repeat blood work at next appointment in January.   # 25 minutes face-to-face with the patient discussing the above plan of care; more than 50% of time spent on prognosis/ natural history; counseling and coordination.  I personally interviewed and examined the patient. Agreed with the above plan of care. Patient/family questions were answered. Dr.Brahmanday MD

## 2017-04-06 NOTE — Telephone Encounter (Signed)
Schedule patient on Dr. Sharmaine Base schedule this afternoon. Lauren, NP will initiate evaluation.

## 2017-04-06 NOTE — Telephone Encounter (Signed)
Patient agrees to 1:30 Appointment to see NP then physician
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
## 2017-04-06 NOTE — Telephone Encounter (Signed)
Patient requesting an appointment because he has a new lump in abdominal to right of navel it is hard. Golf ball sized. Please advise.

## 2017-07-21 ENCOUNTER — Telehealth: Payer: Self-pay | Admitting: Internal Medicine

## 2017-07-21 ENCOUNTER — Ambulatory Visit: Payer: Medicare Other

## 2017-07-21 NOTE — Telephone Encounter (Signed)
PET approved; A- [id number]; until- feb 22nd 2019.   Thanks, Nira Conn!

## 2017-07-22 ENCOUNTER — Other Ambulatory Visit: Payer: Medicare Other

## 2017-07-22 ENCOUNTER — Ambulatory Visit: Payer: Medicare Other | Admitting: Internal Medicine

## 2017-07-23 ENCOUNTER — Ambulatory Visit: Payer: Medicare Other | Admitting: Internal Medicine

## 2017-07-23 ENCOUNTER — Other Ambulatory Visit: Payer: Medicare Other

## 2017-07-24 ENCOUNTER — Ambulatory Visit
Admission: RE | Admit: 2017-07-24 | Discharge: 2017-07-24 | Disposition: A | Discharge status: Home / Self Care | Payer: Medicare Other | Source: Ambulatory Visit | Attending: Internal Medicine | Admitting: Internal Medicine

## 2017-07-24 ENCOUNTER — Ambulatory Visit: Payer: Medicare Other

## 2017-07-24 DIAGNOSIS — K760 Fatty (change of) liver, not elsewhere classified: Secondary | ICD-10-CM | POA: Diagnosis not present

## 2017-07-24 DIAGNOSIS — C8223 Follicular lymphoma grade III, unspecified, intra-abdominal lymph nodes: Secondary | ICD-10-CM | POA: Diagnosis present

## 2017-07-24 DIAGNOSIS — K5730 Diverticulosis of large intestine without perforation or abscess without bleeding: Secondary | ICD-10-CM | POA: Diagnosis not present

## 2017-07-24 DIAGNOSIS — R1904 Left lower quadrant abdominal swelling, mass and lump: Secondary | ICD-10-CM | POA: Insufficient documentation

## 2017-07-24 DIAGNOSIS — I2510 Atherosclerotic heart disease of native coronary artery without angina pectoris: Secondary | ICD-10-CM | POA: Insufficient documentation

## 2017-07-24 DIAGNOSIS — I700 Atherosclerosis of aorta: Secondary | ICD-10-CM | POA: Diagnosis not present

## 2017-07-24 MED ORDER — FLUDEOXYGLUCOSE F - 18 (FDG) INJECTION
12.3400 | Freq: Once | INTRAVENOUS | Status: AC | PRN
  Administered 2017-07-24: 12.34 via INTRAVENOUS

## 2017-07-29 ENCOUNTER — Ambulatory Visit: Payer: Medicare Other | Admitting: Internal Medicine

## 2017-07-29 ENCOUNTER — Other Ambulatory Visit: Payer: Medicare Other

## 2017-08-05 ENCOUNTER — Inpatient Hospital Stay (HOSPITAL_BASED_OUTPATIENT_CLINIC_OR_DEPARTMENT_OTHER): Payer: Medicare Other | Admitting: Internal Medicine

## 2017-08-05 ENCOUNTER — Inpatient Hospital Stay: Payer: Medicare Other | Attending: Internal Medicine

## 2017-08-05 ENCOUNTER — Encounter: Payer: Self-pay | Admitting: Internal Medicine

## 2017-08-05 VITALS — BP 116/74 | Temp 97.9°F | Resp 16 | Wt 177.0 lb

## 2017-08-05 DIAGNOSIS — Z87891 Personal history of nicotine dependence: Secondary | ICD-10-CM | POA: Diagnosis not present

## 2017-08-05 DIAGNOSIS — Z79899 Other long term (current) drug therapy: Secondary | ICD-10-CM

## 2017-08-05 DIAGNOSIS — N183 Chronic kidney disease, stage 3 (moderate): Secondary | ICD-10-CM

## 2017-08-05 DIAGNOSIS — Z9221 Personal history of antineoplastic chemotherapy: Secondary | ICD-10-CM

## 2017-08-05 DIAGNOSIS — Z794 Long term (current) use of insulin: Secondary | ICD-10-CM | POA: Diagnosis not present

## 2017-08-05 DIAGNOSIS — E1122 Type 2 diabetes mellitus with diabetic chronic kidney disease: Secondary | ICD-10-CM

## 2017-08-05 DIAGNOSIS — C8223 Follicular lymphoma grade III, unspecified, intra-abdominal lymph nodes: Secondary | ICD-10-CM

## 2017-08-05 DIAGNOSIS — Z923 Personal history of irradiation: Secondary | ICD-10-CM | POA: Insufficient documentation

## 2017-08-05 DIAGNOSIS — I2510 Atherosclerotic heart disease of native coronary artery without angina pectoris: Secondary | ICD-10-CM

## 2017-08-05 DIAGNOSIS — Z882 Allergy status to sulfonamides status: Secondary | ICD-10-CM | POA: Insufficient documentation

## 2017-08-05 DIAGNOSIS — I129 Hypertensive chronic kidney disease with stage 1 through stage 4 chronic kidney disease, or unspecified chronic kidney disease: Secondary | ICD-10-CM | POA: Diagnosis not present

## 2017-08-05 DIAGNOSIS — Z7982 Long term (current) use of aspirin: Secondary | ICD-10-CM

## 2017-08-05 DIAGNOSIS — Z8572 Personal history of non-Hodgkin lymphomas: Secondary | ICD-10-CM | POA: Diagnosis not present

## 2017-08-05 LAB — CBC WITH DIFFERENTIAL/PLATELET
BASOS ABS: 0.1 10*3/uL (ref 0–0.1)
BASOS PCT: 1 %
EOS PCT: 2 %
Eosinophils Absolute: 0.2 10*3/uL (ref 0–0.7)
HCT: 39.8 % — ABNORMAL LOW (ref 40.0–52.0)
Hemoglobin: 13.4 g/dL (ref 13.0–18.0)
LYMPHS PCT: 22 %
Lymphs Abs: 2 10*3/uL (ref 1.0–3.6)
MCH: 32.6 pg (ref 26.0–34.0)
MCHC: 33.5 g/dL (ref 32.0–36.0)
MCV: 97.1 fL (ref 80.0–100.0)
MONO ABS: 0.8 10*3/uL (ref 0.2–1.0)
Monocytes Relative: 10 %
NEUTROS ABS: 5.6 10*3/uL (ref 1.4–6.5)
NEUTROS PCT: 65 %
PLATELETS: 237 10*3/uL (ref 150–440)
RBC: 4.1 MIL/uL — ABNORMAL LOW (ref 4.40–5.90)
RDW: 13.9 % (ref 11.5–14.5)
WBC: 8.7 10*3/uL (ref 3.8–10.6)

## 2017-08-05 LAB — COMPREHENSIVE METABOLIC PANEL
ALT: 37 U/L (ref 17–63)
AST: 29 U/L (ref 15–41)
Albumin: 3.7 g/dL (ref 3.5–5.0)
Alkaline Phosphatase: 51 U/L (ref 38–126)
Anion gap: 11 (ref 5–15)
BUN: 24 mg/dL — ABNORMAL HIGH (ref 6–20)
CO2: 23 mmol/L (ref 22–32)
CREATININE: 1.86 mg/dL — AB (ref 0.61–1.24)
Calcium: 8.7 mg/dL — ABNORMAL LOW (ref 8.9–10.3)
Chloride: 105 mmol/L (ref 101–111)
GFR calc non Af Amer: 36 mL/min — ABNORMAL LOW (ref >60)
GFR, EST AFRICAN AMERICAN: 41 mL/min — AB (ref >60)
Glucose, Bld: 192 mg/dL — ABNORMAL HIGH (ref 65–99)
POTASSIUM: 4.1 mmol/L (ref 3.5–5.1)
SODIUM: 139 mmol/L (ref 135–145)
Total Bilirubin: 0.5 mg/dL (ref 0.3–1.2)
Total Protein: 6.6 g/dL (ref 6.5–8.1)

## 2017-08-05 LAB — LACTATE DEHYDROGENASE: LDH: 146 U/L (ref 98–192)

## 2017-08-05 NOTE — Assessment & Plan Note (Addendum)
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  Currently on surveillance  # PET scan giant 2019-overall stable scan; with improvement of most of the lymphadenopathy; except sigmoid 1.2 cm lymph node slightly progressive SUV.  Asymptomatic.  I had a long discussion with the patient wife regarding continued surveillance.  We will continue to monitor closely.  # CKD stage III/diabetes- [Dr.kolluru].   #Follow-up in 6 months labs.  # 25 minutes face-to-face with the patient discussing the above plan of care; more than 50% of time spent on prognosis/ natural history; counseling and coordination.  # I reviewed the blood work- with the patient in detail; also reviewed the imaging independently [as summarized above]; and with the patient in detail.
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
## 2018-02-03 ENCOUNTER — Inpatient Hospital Stay (HOSPITAL_BASED_OUTPATIENT_CLINIC_OR_DEPARTMENT_OTHER): Payer: Medicare Other | Admitting: Internal Medicine

## 2018-02-03 ENCOUNTER — Inpatient Hospital Stay: Payer: Medicare Other | Attending: Internal Medicine

## 2018-02-03 VITALS — BP 127/75 | HR 65 | Temp 97.5°F | Resp 20

## 2018-02-03 DIAGNOSIS — I2510 Atherosclerotic heart disease of native coronary artery without angina pectoris: Secondary | ICD-10-CM

## 2018-02-03 DIAGNOSIS — Z9221 Personal history of antineoplastic chemotherapy: Secondary | ICD-10-CM | POA: Diagnosis not present

## 2018-02-03 DIAGNOSIS — I129 Hypertensive chronic kidney disease with stage 1 through stage 4 chronic kidney disease, or unspecified chronic kidney disease: Secondary | ICD-10-CM | POA: Insufficient documentation

## 2018-02-03 DIAGNOSIS — Z87891 Personal history of nicotine dependence: Secondary | ICD-10-CM | POA: Diagnosis not present

## 2018-02-03 DIAGNOSIS — N183 Chronic kidney disease, stage 3 (moderate): Secondary | ICD-10-CM

## 2018-02-03 DIAGNOSIS — R5381 Other malaise: Secondary | ICD-10-CM | POA: Insufficient documentation

## 2018-02-03 DIAGNOSIS — Z923 Personal history of irradiation: Secondary | ICD-10-CM

## 2018-02-03 DIAGNOSIS — Z7982 Long term (current) use of aspirin: Secondary | ICD-10-CM

## 2018-02-03 DIAGNOSIS — R5383 Other fatigue: Secondary | ICD-10-CM | POA: Diagnosis not present

## 2018-02-03 DIAGNOSIS — E1122 Type 2 diabetes mellitus with diabetic chronic kidney disease: Secondary | ICD-10-CM | POA: Diagnosis not present

## 2018-02-03 DIAGNOSIS — Z79899 Other long term (current) drug therapy: Secondary | ICD-10-CM | POA: Diagnosis not present

## 2018-02-03 DIAGNOSIS — C8223 Follicular lymphoma grade III, unspecified, intra-abdominal lymph nodes: Secondary | ICD-10-CM | POA: Diagnosis present

## 2018-02-03 DIAGNOSIS — I429 Cardiomyopathy, unspecified: Secondary | ICD-10-CM

## 2018-02-03 DIAGNOSIS — Z794 Long term (current) use of insulin: Secondary | ICD-10-CM | POA: Diagnosis not present

## 2018-02-03 DIAGNOSIS — Z8572 Personal history of non-Hodgkin lymphomas: Secondary | ICD-10-CM | POA: Diagnosis not present

## 2018-02-03 LAB — COMPREHENSIVE METABOLIC PANEL
ALBUMIN: 3.9 g/dL (ref 3.5–5.0)
ALT: 56 U/L — ABNORMAL HIGH (ref 0–44)
AST: 36 U/L (ref 15–41)
Alkaline Phosphatase: 70 U/L (ref 38–126)
Anion gap: 7 (ref 5–15)
BILIRUBIN TOTAL: 0.5 mg/dL (ref 0.3–1.2)
BUN: 28 mg/dL — ABNORMAL HIGH (ref 8–23)
CHLORIDE: 108 mmol/L (ref 98–111)
CO2: 21 mmol/L — ABNORMAL LOW (ref 22–32)
Calcium: 8.5 mg/dL — ABNORMAL LOW (ref 8.9–10.3)
Creatinine, Ser: 1.95 mg/dL — ABNORMAL HIGH (ref 0.61–1.24)
GFR calc Af Amer: 39 mL/min — ABNORMAL LOW (ref >60)
GFR calc non Af Amer: 34 mL/min — ABNORMAL LOW (ref >60)
Glucose, Bld: 193 mg/dL — ABNORMAL HIGH (ref 70–99)
POTASSIUM: 4.4 mmol/L (ref 3.5–5.1)
SODIUM: 136 mmol/L (ref 135–145)
Total Protein: 6.9 g/dL (ref 6.5–8.1)

## 2018-02-03 LAB — CBC WITH DIFFERENTIAL/PLATELET
BASOS PCT: 2 %
Basophils Absolute: 0.2 10*3/uL — ABNORMAL HIGH (ref 0–0.1)
EOS ABS: 0.2 10*3/uL (ref 0–0.7)
EOS PCT: 2 %
HCT: 41 % (ref 40.0–52.0)
Hemoglobin: 14 g/dL (ref 13.0–18.0)
Lymphocytes Relative: 21 %
Lymphs Abs: 2.2 10*3/uL (ref 1.0–3.6)
MCH: 32.8 pg (ref 26.0–34.0)
MCHC: 34.1 g/dL (ref 32.0–36.0)
MCV: 96.1 fL (ref 80.0–100.0)
MONO ABS: 1 10*3/uL (ref 0.2–1.0)
MONOS PCT: 9 %
Neutro Abs: 7.2 10*3/uL — ABNORMAL HIGH (ref 1.4–6.5)
Neutrophils Relative %: 66 %
PLATELETS: 250 10*3/uL (ref 150–440)
RBC: 4.27 MIL/uL — ABNORMAL LOW (ref 4.40–5.90)
RDW: 14.1 % (ref 11.5–14.5)
WBC: 10.8 10*3/uL — ABNORMAL HIGH (ref 3.8–10.6)

## 2018-02-03 NOTE — Assessment & Plan Note (Addendum)
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  Currently on surveillance.   # PET scan Jan 2019-overall stable scan; with improvement of most of the lymphadenopathy; except LN near sigmoid 1.2 cm lymph node slightly progressive SUV.  Otherwise clinically stable.  We will plan to get a PET scan again in 6 months.  # CKD stage III/diabetes- [Dr.kolluru].  Stable.  #Follow-up in 6 months labs; PET scan.
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
## 2018-08-02 ENCOUNTER — Other Ambulatory Visit: Payer: Medicare Other

## 2018-08-04 ENCOUNTER — Other Ambulatory Visit: Payer: Medicare Other

## 2018-08-04 ENCOUNTER — Ambulatory Visit: Payer: Medicare Other | Admitting: Internal Medicine

## 2018-08-16 ENCOUNTER — Ambulatory Visit: Payer: Medicare Other

## 2018-08-17 ENCOUNTER — Other Ambulatory Visit: Payer: Medicare Other

## 2018-08-18 ENCOUNTER — Ambulatory Visit: Payer: Medicare Other | Admitting: Internal Medicine

## 2018-08-18 ENCOUNTER — Other Ambulatory Visit: Payer: Medicare Other

## 2018-08-23 ENCOUNTER — Ambulatory Visit
Admission: RE | Admit: 2018-08-23 | Discharge: 2018-08-23 | Disposition: A | Discharge status: Home / Self Care | Payer: Medicare Other | Source: Ambulatory Visit | Attending: Internal Medicine | Admitting: Internal Medicine

## 2018-08-23 DIAGNOSIS — J439 Emphysema, unspecified: Secondary | ICD-10-CM | POA: Diagnosis not present

## 2018-08-23 DIAGNOSIS — I2510 Atherosclerotic heart disease of native coronary artery without angina pectoris: Secondary | ICD-10-CM | POA: Diagnosis not present

## 2018-08-23 DIAGNOSIS — C8223 Follicular lymphoma grade III, unspecified, intra-abdominal lymph nodes: Secondary | ICD-10-CM | POA: Diagnosis not present

## 2018-08-23 LAB — GLUCOSE, CAPILLARY
GLUCOSE-CAPILLARY: 56 mg/dL — AB (ref 70–99)
GLUCOSE-CAPILLARY: 73 mg/dL (ref 70–99)

## 2018-08-23 MED ORDER — FLUDEOXYGLUCOSE F - 18 (FDG) INJECTION
9.4000 | Freq: Once | INTRAVENOUS | Status: AC | PRN
  Administered 2018-08-23: 9.04 via INTRAVENOUS

## 2018-08-24 ENCOUNTER — Inpatient Hospital Stay (HOSPITAL_BASED_OUTPATIENT_CLINIC_OR_DEPARTMENT_OTHER): Payer: Medicare Other | Admitting: Internal Medicine

## 2018-08-24 ENCOUNTER — Encounter: Payer: Self-pay | Admitting: Internal Medicine

## 2018-08-24 ENCOUNTER — Inpatient Hospital Stay: Payer: Medicare Other | Attending: Internal Medicine

## 2018-08-24 VITALS — BP 150/90 | HR 67 | Resp 16 | Wt 185.3 lb

## 2018-08-24 DIAGNOSIS — Z923 Personal history of irradiation: Secondary | ICD-10-CM | POA: Insufficient documentation

## 2018-08-24 DIAGNOSIS — I10 Essential (primary) hypertension: Secondary | ICD-10-CM | POA: Insufficient documentation

## 2018-08-24 DIAGNOSIS — C8223 Follicular lymphoma grade III, unspecified, intra-abdominal lymph nodes: Secondary | ICD-10-CM | POA: Insufficient documentation

## 2018-08-24 DIAGNOSIS — Z7982 Long term (current) use of aspirin: Secondary | ICD-10-CM | POA: Insufficient documentation

## 2018-08-24 DIAGNOSIS — Z9221 Personal history of antineoplastic chemotherapy: Secondary | ICD-10-CM | POA: Diagnosis not present

## 2018-08-24 DIAGNOSIS — Z794 Long term (current) use of insulin: Secondary | ICD-10-CM | POA: Diagnosis not present

## 2018-08-24 DIAGNOSIS — E1122 Type 2 diabetes mellitus with diabetic chronic kidney disease: Secondary | ICD-10-CM

## 2018-08-24 DIAGNOSIS — D6489 Other specified anemias: Secondary | ICD-10-CM | POA: Diagnosis not present

## 2018-08-24 DIAGNOSIS — N183 Chronic kidney disease, stage 3 (moderate): Secondary | ICD-10-CM | POA: Insufficient documentation

## 2018-08-24 DIAGNOSIS — Z87891 Personal history of nicotine dependence: Secondary | ICD-10-CM | POA: Insufficient documentation

## 2018-08-24 DIAGNOSIS — Z79899 Other long term (current) drug therapy: Secondary | ICD-10-CM | POA: Diagnosis not present

## 2018-08-24 LAB — COMPREHENSIVE METABOLIC PANEL
ALT: 27 U/L (ref 0–44)
AST: 25 U/L (ref 15–41)
Albumin: 3.6 g/dL (ref 3.5–5.0)
Alkaline Phosphatase: 54 U/L (ref 38–126)
Anion gap: 5 (ref 5–15)
BUN: 23 mg/dL (ref 8–23)
CO2: 27 mmol/L (ref 22–32)
Calcium: 8.5 mg/dL — ABNORMAL LOW (ref 8.9–10.3)
Chloride: 110 mmol/L (ref 98–111)
Creatinine, Ser: 2.09 mg/dL — ABNORMAL HIGH (ref 0.61–1.24)
GFR calc Af Amer: 36 mL/min — ABNORMAL LOW (ref >60)
GFR, EST NON AFRICAN AMERICAN: 31 mL/min — AB (ref >60)
Glucose, Bld: 99 mg/dL (ref 70–99)
Potassium: 3.8 mmol/L (ref 3.5–5.1)
Sodium: 142 mmol/L (ref 135–145)
Total Bilirubin: 0.4 mg/dL (ref 0.3–1.2)
Total Protein: 6.3 g/dL — ABNORMAL LOW (ref 6.5–8.1)

## 2018-08-24 LAB — CBC WITH DIFFERENTIAL/PLATELET
Abs Immature Granulocytes: 0.04 10*3/uL (ref 0.00–0.07)
Basophils Absolute: 0.1 10*3/uL (ref 0.0–0.1)
Basophils Relative: 1 %
Eosinophils Absolute: 0.3 10*3/uL (ref 0.0–0.5)
Eosinophils Relative: 3 %
HCT: 38.4 % — ABNORMAL LOW (ref 39.0–52.0)
Hemoglobin: 12.9 g/dL — ABNORMAL LOW (ref 13.0–17.0)
Immature Granulocytes: 0 %
Lymphocytes Relative: 19 %
Lymphs Abs: 1.7 10*3/uL (ref 0.7–4.0)
MCH: 31.5 pg (ref 26.0–34.0)
MCHC: 33.6 g/dL (ref 30.0–36.0)
MCV: 93.9 fL (ref 80.0–100.0)
Monocytes Absolute: 0.8 10*3/uL (ref 0.1–1.0)
Monocytes Relative: 9 %
Neutro Abs: 6.2 10*3/uL (ref 1.7–7.7)
Neutrophils Relative %: 68 %
Platelets: 263 10*3/uL (ref 150–400)
RBC: 4.09 MIL/uL — ABNORMAL LOW (ref 4.22–5.81)
RDW: 14 % (ref 11.5–15.5)
WBC: 9.2 10*3/uL (ref 4.0–10.5)
nRBC: 0 % (ref 0.0–0.2)

## 2018-08-24 LAB — LACTATE DEHYDROGENASE: LDH: 138 U/L (ref 98–192)
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
## 2018-08-24 NOTE — Assessment & Plan Note (Addendum)
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    #Patient currently asymptomatic.  PET scan FEB 2020- slight increase in size of abdominal LN/ RP LN again ~ 1-1.2 cm in size. Will review at tumor conference/ Biopsy planning. Previous subcarinal Bx- NEG [feb 2016]  # Mild anemia- 12.9/likley CKD- recommend PO qOD.   # CKD stage III/diabetes- [Dr.kolluru]. stable.  # DISPOSITION: # TBD/ will discuss at tumor conference/ will call with results  # I reviewed the blood work- with the patient in detail; also reviewed the imaging independently [as summarized above]; and with the patient in detail.

## 2018-08-26 ENCOUNTER — Other Ambulatory Visit: Payer: Medicare Other

## 2018-08-26 NOTE — Progress Notes (Signed)
Tumor Board Documentation  Mitchell Chang. was presented by Dr Rogue Bussing at our Tumor Board on 08/26/2018, which included representatives from medical oncology, radiation oncology, surgical oncology, internal medicine, navigation, pathology, radiology, surgical, research.  Mitchell Chang currently presents as a current patient, for Mitchell Chang, for discussion with history of the following treatments: active survellience, immunotherapy.  Additionally, we reviewed previous medical and familial history, history of present illness, and recent lab results along with all available histopathologic and imaging studies. The tumor board considered available treatment options and made the following recommendations: Active surveillance, Biopsy Biopsy vs rescan in 6 months  The following procedures/referrals were also placed: No orders of the defined types were placed in this encounter.   Clinical Trial Status: not discussed   Staging used: AJCC Stage Group AJCC Staging:       Group: Follicular Grade 3 Lymphoma Stage 4National site-specific guidelines NCCN were discussed with respect to the case.  Tumor board is a meeting of clinicians from various specialty areas who evaluate and discuss patients for whom a multidisciplinary approach is being considered. Final determinations in the plan of care are those of the provider(s). The responsibility for follow up of recommendations given during tumor board is that of the provider.   Today's extended care, comprehensive team conference, Mitchell Chang was not present for the discussion and was not examined.   Multidisciplinary Tumor Board is a multidisciplinary case peer review process.  Decisions discussed in the Multidisciplinary Tumor Board reflect the opinions of the specialists present at the conference without having examined the patient.  Ultimately, treatment and diagnostic decisions rest with the primary provider(s) and the patient.

## 2018-08-27 ENCOUNTER — Telehealth: Payer: Self-pay | Admitting: Internal Medicine

## 2018-08-27 DIAGNOSIS — C8223 Follicular lymphoma grade III, unspecified, intra-abdominal lymph nodes: Secondary | ICD-10-CM

## 2018-08-27 NOTE — Telephone Encounter (Signed)
Imaging reviewed at tumor conference-slight increase in size of the omental nodule/which could be biopsied.  #Given multiple comorbidities/as patient is asymptomatic-recommend surveillance PET scan in approximately 6 months.  Patient is agreement.  #[facility] please schedule a follow-up appointment in 6 months/CBC CMP LDH/PET scan prior.

## 2018-08-30 NOTE — Addendum Note (Signed)
Addended by: Sandria Bales B on: 08/30/2018 09:36 AM   Modules accepted: Orders

## 2019-02-28 ENCOUNTER — Ambulatory Visit: Payer: Medicare Other

## 2019-03-02 ENCOUNTER — Ambulatory Visit: Payer: Medicare Other | Admitting: Internal Medicine

## 2019-03-02 ENCOUNTER — Other Ambulatory Visit: Payer: Medicare Other

## 2019-03-07 ENCOUNTER — Telehealth: Payer: Self-pay | Admitting: Internal Medicine

## 2019-03-07 DIAGNOSIS — C8223 Follicular lymphoma grade III, unspecified, intra-abdominal lymph nodes: Secondary | ICD-10-CM

## 2019-03-07 NOTE — Telephone Encounter (Signed)
The patient has been notified of this and CT scan has been scheduled.

## 2019-03-07 NOTE — Telephone Encounter (Signed)
Please inform patient that PET has been denied by insurance; will order CT of the chest and pelvis noncontrast; follow-up with me few days after the CT scans.  And then decide based upon CT scan results if patient needs a PET scan.  Please schedule CT scans in 1 week/follow-up with me 1 to 2 days later.

## 2019-03-14 ENCOUNTER — Ambulatory Visit: Payer: Medicare Other

## 2019-03-15 ENCOUNTER — Other Ambulatory Visit: Payer: Self-pay

## 2019-03-15 ENCOUNTER — Ambulatory Visit
Admission: RE | Admit: 2019-03-15 | Discharge: 2019-03-15 | Disposition: A | Discharge status: Home / Self Care | Payer: Medicare Other | Source: Ambulatory Visit | Attending: Internal Medicine | Admitting: Internal Medicine

## 2019-03-15 DIAGNOSIS — C8223 Follicular lymphoma grade III, unspecified, intra-abdominal lymph nodes: Secondary | ICD-10-CM

## 2019-03-18 ENCOUNTER — Ambulatory Visit: Payer: Medicare Other | Admitting: Internal Medicine

## 2019-03-25 ENCOUNTER — Other Ambulatory Visit: Payer: Self-pay

## 2019-03-25 ENCOUNTER — Encounter: Payer: Self-pay | Admitting: Internal Medicine

## 2019-03-25 ENCOUNTER — Inpatient Hospital Stay: Payer: Medicare Other | Attending: Internal Medicine | Admitting: Internal Medicine

## 2019-03-25 DIAGNOSIS — Z923 Personal history of irradiation: Secondary | ICD-10-CM | POA: Diagnosis not present

## 2019-03-25 DIAGNOSIS — C8223 Follicular lymphoma grade III, unspecified, intra-abdominal lymph nodes: Secondary | ICD-10-CM | POA: Diagnosis present

## 2019-03-25 DIAGNOSIS — Z794 Long term (current) use of insulin: Secondary | ICD-10-CM | POA: Diagnosis not present

## 2019-03-25 DIAGNOSIS — Z87891 Personal history of nicotine dependence: Secondary | ICD-10-CM | POA: Insufficient documentation

## 2019-03-25 DIAGNOSIS — Z9221 Personal history of antineoplastic chemotherapy: Secondary | ICD-10-CM | POA: Diagnosis not present

## 2019-03-25 DIAGNOSIS — D649 Anemia, unspecified: Secondary | ICD-10-CM | POA: Insufficient documentation

## 2019-03-25 DIAGNOSIS — Z79899 Other long term (current) drug therapy: Secondary | ICD-10-CM | POA: Diagnosis not present

## 2019-03-25 DIAGNOSIS — I10 Essential (primary) hypertension: Secondary | ICD-10-CM | POA: Insufficient documentation

## 2019-03-25 DIAGNOSIS — E119 Type 2 diabetes mellitus without complications: Secondary | ICD-10-CM | POA: Diagnosis not present

## 2019-03-25 DIAGNOSIS — Z7982 Long term (current) use of aspirin: Secondary | ICD-10-CM | POA: Diagnosis not present

## 2019-03-25 DIAGNOSIS — N183 Chronic kidney disease, stage 3 (moderate): Secondary | ICD-10-CM | POA: Diagnosis not present

## 2019-03-25 NOTE — Assessment & Plan Note (Addendum)
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    #Patient continues to be clinically asymptomatic.  Stable.  PET scan FEB 2020- slight increase in size of abdominal LN/ RP LN again ~ 1-1.2 cm in size.  September 2020 CT scan -stable mediastinal/mesenteric adenopathy [PET declined by insurance]. Previous subcarinal Bx- NEG [feb 2016].  As patient continues to be asymptomatic and no significant growth of the lymph nodes-I think it is reasonable to continue surveillance.  We will repeat imaging in 8 months.  # Mild anemia- 12.9/likley CKD- recommend PO qOD.  Stable.  Labs with PCP.  # CKD stage III/diabetes- [Dr.kolluru].  Stable.  # DISPOSITION: # follow up in 8 months- MD; labs- cbc/cmp/ldh; CT prior [Tuesday/fridays/late afternoons]- Dr.B
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
## 2019-03-25 NOTE — Progress Notes (Signed)
Pt in for follow up, denies any difficulties or concerns.  

## 2019-11-22 ENCOUNTER — Ambulatory Visit
Admission: RE | Admit: 2019-11-22 | Discharge: 2019-11-22 | Disposition: A | Discharge status: Home / Self Care | Payer: Medicare PPO | Source: Ambulatory Visit | Attending: Internal Medicine | Admitting: Internal Medicine

## 2019-11-22 ENCOUNTER — Other Ambulatory Visit: Payer: Self-pay

## 2019-11-22 DIAGNOSIS — C8223 Follicular lymphoma grade III, unspecified, intra-abdominal lymph nodes: Secondary | ICD-10-CM | POA: Diagnosis present

## 2019-11-24 ENCOUNTER — Encounter: Payer: Self-pay | Admitting: Internal Medicine

## 2019-11-24 ENCOUNTER — Other Ambulatory Visit: Payer: Self-pay

## 2019-11-25 ENCOUNTER — Inpatient Hospital Stay (HOSPITAL_BASED_OUTPATIENT_CLINIC_OR_DEPARTMENT_OTHER): Payer: Medicare PPO | Admitting: Internal Medicine

## 2019-11-25 ENCOUNTER — Inpatient Hospital Stay: Payer: Medicare PPO | Attending: Internal Medicine

## 2019-11-25 VITALS — BP 143/86 | HR 57 | Temp 96.6°F | Resp 18 | Wt 185.9 lb

## 2019-11-25 DIAGNOSIS — C8298 Follicular lymphoma, unspecified, lymph nodes of multiple sites: Secondary | ICD-10-CM | POA: Diagnosis not present

## 2019-11-25 DIAGNOSIS — Z9221 Personal history of antineoplastic chemotherapy: Secondary | ICD-10-CM | POA: Insufficient documentation

## 2019-11-25 DIAGNOSIS — E1122 Type 2 diabetes mellitus with diabetic chronic kidney disease: Secondary | ICD-10-CM | POA: Diagnosis not present

## 2019-11-25 DIAGNOSIS — H547 Unspecified visual loss: Secondary | ICD-10-CM | POA: Insufficient documentation

## 2019-11-25 DIAGNOSIS — Z87891 Personal history of nicotine dependence: Secondary | ICD-10-CM | POA: Insufficient documentation

## 2019-11-25 DIAGNOSIS — C8223 Follicular lymphoma grade III, unspecified, intra-abdominal lymph nodes: Secondary | ICD-10-CM

## 2019-11-25 DIAGNOSIS — Z7189 Other specified counseling: Secondary | ICD-10-CM

## 2019-11-25 DIAGNOSIS — N183 Chronic kidney disease, stage 3 unspecified: Secondary | ICD-10-CM | POA: Insufficient documentation

## 2019-11-25 LAB — COMPREHENSIVE METABOLIC PANEL
ALT: 35 U/L (ref 0–44)
AST: 26 U/L (ref 15–41)
Albumin: 4 g/dL (ref 3.5–5.0)
Alkaline Phosphatase: 56 U/L (ref 38–126)
Anion gap: 8 (ref 5–15)
BUN: 21 mg/dL (ref 8–23)
CO2: 25 mmol/L (ref 22–32)
Calcium: 8.7 mg/dL — ABNORMAL LOW (ref 8.9–10.3)
Chloride: 104 mmol/L (ref 98–111)
Creatinine, Ser: 1.97 mg/dL — ABNORMAL HIGH (ref 0.61–1.24)
GFR calc Af Amer: 39 mL/min — ABNORMAL LOW (ref >60)
GFR calc non Af Amer: 33 mL/min — ABNORMAL LOW (ref >60)
Glucose, Bld: 126 mg/dL — ABNORMAL HIGH (ref 70–99)
Potassium: 4.4 mmol/L (ref 3.5–5.1)
Sodium: 137 mmol/L (ref 135–145)
Total Bilirubin: 0.5 mg/dL (ref 0.3–1.2)
Total Protein: 7.1 g/dL (ref 6.5–8.1)

## 2019-11-25 LAB — CBC WITH DIFFERENTIAL/PLATELET
Abs Immature Granulocytes: 0.1 10*3/uL — ABNORMAL HIGH (ref 0.00–0.07)
Basophils Absolute: 0.1 10*3/uL (ref 0.0–0.1)
Basophils Relative: 1 %
Eosinophils Absolute: 0.2 10*3/uL (ref 0.0–0.5)
Eosinophils Relative: 1 %
HCT: 40.7 % (ref 39.0–52.0)
Hemoglobin: 14 g/dL (ref 13.0–17.0)
Immature Granulocytes: 1 %
Lymphocytes Relative: 15 %
Lymphs Abs: 1.8 10*3/uL (ref 0.7–4.0)
MCH: 32.5 pg (ref 26.0–34.0)
MCHC: 34.4 g/dL (ref 30.0–36.0)
MCV: 94.4 fL (ref 80.0–100.0)
Monocytes Absolute: 1 10*3/uL (ref 0.1–1.0)
Monocytes Relative: 8 %
Neutro Abs: 8.8 10*3/uL — ABNORMAL HIGH (ref 1.7–7.7)
Neutrophils Relative %: 74 %
Platelets: 232 10*3/uL (ref 150–400)
RBC: 4.31 MIL/uL (ref 4.22–5.81)
RDW: 13.7 % (ref 11.5–15.5)
WBC: 11.8 10*3/uL — ABNORMAL HIGH (ref 4.0–10.5)
nRBC: 0 % (ref 0.0–0.2)

## 2019-11-25 LAB — LACTATE DEHYDROGENASE: LDH: 170 U/L (ref 98–192)

## 2019-11-25 NOTE — Progress Notes (Signed)
Pt and wife in for follow up, denies any concerns today. 

## 2019-11-25 NOTE — Assessment & Plan Note (Addendum)
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  Clinically asymptomatic; however CT scan May 2021-progressive bulky mediastinal up to 3.5 cm subcarinal lymph node; retrocrural; question pericardial/ abdominal adenopathy; recommend PET scan for further evaluation/rule out transformation.  #Discussed regarding the start therapy with rituximab; weekly x4 infusion.  Again treatments are palliative not curative.  Discussed the potential side effects of Rituxan including but not related to infusion reactions risk of infections etc.   # Mild anemia- 12.9/likley CKD- recommend PO qOD.  Stable.  Labs with PCP.  # CKD stage III-GFR 33; diabetes- [Dr.kolluru].  # DISPOSITION: not wed; AM appt # PET scan ASAP [speak to pt] # June 3rd-MD; labs- cbc/cmp/ldh;hepc/hep C panel Rituxan [AM appt]- Dr.B

## 2019-11-25 NOTE — Progress Notes (Signed)
START ON PATHWAY REGIMEN - Lymphoma and CLL     Administer weekly:     Rituximab-xxxx   **Always confirm dose/schedule in your pharmacy ordering system**  Patient Characteristics: Follicular Lymphoma, Grades 1, 2, and 3A, Second Line, Prior Treatment with Rituximab Alone, Relapse ? 24 Months Disease Type: Follicular Lymphoma, Grade 1, 2, or 3A Disease Type: Not Applicable Disease Type: Not Applicable [place] Stage: III Line of Therapy: Second Line Prior Treatment: Prior Treatment with Rituximab Alone Time to Relapse: Relapse ? 24 Months Intent of Therapy: Non-Curative / Palliative Intent, Discussed with Patient

## 2019-11-27 DIAGNOSIS — Z7189 Other specified counseling: Secondary | ICD-10-CM | POA: Insufficient documentation
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
## 2019-12-05 ENCOUNTER — Ambulatory Visit: Payer: Medicare PPO

## 2019-12-06 ENCOUNTER — Other Ambulatory Visit: Payer: Self-pay

## 2019-12-06 ENCOUNTER — Ambulatory Visit
Admission: RE | Admit: 2019-12-06 | Discharge: 2019-12-06 | Disposition: A | Discharge status: Home / Self Care | Payer: Medicare PPO | Source: Ambulatory Visit | Attending: Internal Medicine | Admitting: Internal Medicine

## 2019-12-06 DIAGNOSIS — C8223 Follicular lymphoma grade III, unspecified, intra-abdominal lymph nodes: Secondary | ICD-10-CM | POA: Diagnosis not present

## 2019-12-06 DIAGNOSIS — I700 Atherosclerosis of aorta: Secondary | ICD-10-CM | POA: Insufficient documentation

## 2019-12-06 DIAGNOSIS — J439 Emphysema, unspecified: Secondary | ICD-10-CM | POA: Diagnosis not present

## 2019-12-06 DIAGNOSIS — E118 Type 2 diabetes mellitus with unspecified complications: Secondary | ICD-10-CM | POA: Insufficient documentation

## 2019-12-06 DIAGNOSIS — Z794 Long term (current) use of insulin: Secondary | ICD-10-CM | POA: Insufficient documentation

## 2019-12-06 DIAGNOSIS — I2510 Atherosclerotic heart disease of native coronary artery without angina pectoris: Secondary | ICD-10-CM | POA: Insufficient documentation

## 2019-12-06 DIAGNOSIS — K760 Fatty (change of) liver, not elsewhere classified: Secondary | ICD-10-CM | POA: Diagnosis not present

## 2019-12-06 LAB — GLUCOSE, CAPILLARY: Glucose-Capillary: 92 mg/dL (ref 70–99)

## 2019-12-06 MED ORDER — FLUDEOXYGLUCOSE F - 18 (FDG) INJECTION
10.5000 | Freq: Once | INTRAVENOUS | Status: AC | PRN
  Administered 2019-12-06: 10.5 via INTRAVENOUS

## 2019-12-08 NOTE — Progress Notes (Signed)
Pharmacist Chemotherapy Monitoring - Initial Assessment    Anticipated start date: 12/15/19   Regimen:  . Are orders appropriate based on the patient's diagnosis, regimen, and cycle? Yes . Does the plan date match the patient's scheduled date? Yes . Is the sequencing of drugs appropriate? Yes . Are the premedications appropriate for the patient's regimen? Yes . Prior Authorization for treatment is: Pending o If applicable, is the correct biosimilar selected based on the patient's insurance? yes  Organ Function and Labs: Marland Kitchen Are dose adjustments needed based on the patient's renal function, hepatic function, or hematologic function? No . Are appropriate labs ordered prior to the start of patient's treatment? Yes . Other organ system assessment, if indicated: N/A . The following baseline labs, if indicated, have been ordered: rituximab: baseline Hepatitis B labs  Dose Assessment: . Are the drug doses appropriate? Yes . Are the following correct: o Drug concentrations Yes o IV fluid compatible with drug Yes o Administration routes Yes o Timing of therapy Yes . If applicable, does the patient have documented access for treatment and/or plans for port-a-cath placement? not applicable . If applicable, have lifetime cumulative doses been properly documented and assessed? not applicable Lifetime Dose Tracking  No doses have been documented on this patient for the following tracked chemicals: Doxorubicin, Epirubicin, Idarubicin, Daunorubicin, Mitoxantrone, Bleomycin, Oxaliplatin, Carboplatin, Liposomal Doxorubicin  o   Toxicity Monitoring/Prevention: . The patient has the following take home antiemetics prescribed: N/A . The patient has the following take home medications prescribed: N/A . Medication allergies and previous infusion related reactions, if applicable, have been reviewed and addressed. Yes . The patient's current medication list has been assessed for drug-drug interactions with  their chemotherapy regimen. no significant drug-drug interactions were identified on review.  Order Review: . Are the treatment plan orders signed? No . Is the patient scheduled to see a provider prior to their treatment? Yes  I verify that I have reviewed each item in the above checklist and answered each question accordingly.  Salome Cozby K 12/08/2019 8:44 AM

## 2019-12-14 ENCOUNTER — Other Ambulatory Visit: Payer: Self-pay | Admitting: *Deleted

## 2019-12-14 DIAGNOSIS — C8223 Follicular lymphoma grade III, unspecified, intra-abdominal lymph nodes: Secondary | ICD-10-CM

## 2019-12-15 ENCOUNTER — Inpatient Hospital Stay: Payer: Medicare PPO

## 2019-12-15 ENCOUNTER — Other Ambulatory Visit: Payer: Self-pay

## 2019-12-15 ENCOUNTER — Inpatient Hospital Stay (HOSPITAL_BASED_OUTPATIENT_CLINIC_OR_DEPARTMENT_OTHER): Payer: Medicare PPO | Admitting: Internal Medicine

## 2019-12-15 ENCOUNTER — Inpatient Hospital Stay: Payer: Medicare PPO | Attending: Internal Medicine

## 2019-12-15 VITALS — BP 158/78 | HR 58

## 2019-12-15 DIAGNOSIS — C8223 Follicular lymphoma grade III, unspecified, intra-abdominal lymph nodes: Secondary | ICD-10-CM

## 2019-12-15 DIAGNOSIS — E1122 Type 2 diabetes mellitus with diabetic chronic kidney disease: Secondary | ICD-10-CM | POA: Diagnosis not present

## 2019-12-15 DIAGNOSIS — Z87891 Personal history of nicotine dependence: Secondary | ICD-10-CM | POA: Diagnosis not present

## 2019-12-15 DIAGNOSIS — C8593 Non-Hodgkin lymphoma, unspecified, intra-abdominal lymph nodes: Secondary | ICD-10-CM | POA: Insufficient documentation

## 2019-12-15 DIAGNOSIS — D649 Anemia, unspecified: Secondary | ICD-10-CM | POA: Diagnosis not present

## 2019-12-15 DIAGNOSIS — Z79899 Other long term (current) drug therapy: Secondary | ICD-10-CM | POA: Insufficient documentation

## 2019-12-15 DIAGNOSIS — Z5112 Encounter for antineoplastic immunotherapy: Secondary | ICD-10-CM | POA: Diagnosis present

## 2019-12-15 DIAGNOSIS — I129 Hypertensive chronic kidney disease with stage 1 through stage 4 chronic kidney disease, or unspecified chronic kidney disease: Secondary | ICD-10-CM | POA: Insufficient documentation

## 2019-12-15 DIAGNOSIS — Z794 Long term (current) use of insulin: Secondary | ICD-10-CM | POA: Insufficient documentation

## 2019-12-15 DIAGNOSIS — N183 Chronic kidney disease, stage 3 unspecified: Secondary | ICD-10-CM | POA: Insufficient documentation

## 2019-12-15 DIAGNOSIS — Z7189 Other specified counseling: Secondary | ICD-10-CM

## 2019-12-15 DIAGNOSIS — Z7982 Long term (current) use of aspirin: Secondary | ICD-10-CM | POA: Insufficient documentation

## 2019-12-15 LAB — HEPATITIS C ANTIBODY: HCV Ab: NONREACTIVE

## 2019-12-15 LAB — COMPREHENSIVE METABOLIC PANEL
ALT: 42 U/L (ref 0–44)
AST: 27 U/L (ref 15–41)
Albumin: 4 g/dL (ref 3.5–5.0)
Alkaline Phosphatase: 56 U/L (ref 38–126)
Anion gap: 9 (ref 5–15)
BUN: 25 mg/dL — ABNORMAL HIGH (ref 8–23)
CO2: 28 mmol/L (ref 22–32)
Calcium: 9.1 mg/dL (ref 8.9–10.3)
Chloride: 105 mmol/L (ref 98–111)
Creatinine, Ser: 1.77 mg/dL — ABNORMAL HIGH (ref 0.61–1.24)
GFR calc Af Amer: 44 mL/min — ABNORMAL LOW (ref >60)
GFR calc non Af Amer: 38 mL/min — ABNORMAL LOW (ref >60)
Glucose, Bld: 134 mg/dL — ABNORMAL HIGH (ref 70–99)
Potassium: 3.9 mmol/L (ref 3.5–5.1)
Sodium: 142 mmol/L (ref 135–145)
Total Bilirubin: 0.6 mg/dL (ref 0.3–1.2)
Total Protein: 7.1 g/dL (ref 6.5–8.1)

## 2019-12-15 LAB — CBC WITH DIFFERENTIAL/PLATELET
Abs Immature Granulocytes: 0.07 10*3/uL (ref 0.00–0.07)
Basophils Absolute: 0.1 10*3/uL (ref 0.0–0.1)
Basophils Relative: 1 %
Eosinophils Absolute: 0.2 10*3/uL (ref 0.0–0.5)
Eosinophils Relative: 2 %
HCT: 40.5 % (ref 39.0–52.0)
Hemoglobin: 13.9 g/dL (ref 13.0–17.0)
Immature Granulocytes: 1 %
Lymphocytes Relative: 15 %
Lymphs Abs: 1.4 10*3/uL (ref 0.7–4.0)
MCH: 32.2 pg (ref 26.0–34.0)
MCHC: 34.3 g/dL (ref 30.0–36.0)
MCV: 93.8 fL (ref 80.0–100.0)
Monocytes Absolute: 0.8 10*3/uL (ref 0.1–1.0)
Monocytes Relative: 9 %
Neutro Abs: 6.8 10*3/uL (ref 1.7–7.7)
Neutrophils Relative %: 72 %
Platelets: 240 10*3/uL (ref 150–400)
RBC: 4.32 MIL/uL (ref 4.22–5.81)
RDW: 14 % (ref 11.5–15.5)
WBC: 9.4 10*3/uL (ref 4.0–10.5)
nRBC: 0 % (ref 0.0–0.2)

## 2019-12-15 LAB — HEPATITIS B SURFACE ANTIGEN: Hepatitis B Surface Ag: NONREACTIVE

## 2019-12-15 LAB — LACTATE DEHYDROGENASE: LDH: 177 U/L (ref 98–192)

## 2019-12-15 MED ORDER — SODIUM CHLORIDE 0.9 % IV SOLN
Freq: Once | INTRAVENOUS | Status: AC
  Filled 2019-12-15: qty 250

## 2019-12-15 MED ORDER — DEXAMETHASONE SODIUM PHOSPHATE 10 MG/ML IJ SOLN
8.0000 mg | Freq: Once | INTRAMUSCULAR | Status: AC
  Administered 2019-12-15: 8 mg via INTRAVENOUS
  Filled 2019-12-15: qty 1

## 2019-12-15 MED ORDER — DIPHENHYDRAMINE HCL 25 MG PO CAPS
50.0000 mg | ORAL_CAPSULE | Freq: Once | ORAL | Status: AC
  Administered 2019-12-15: 50 mg via ORAL
  Filled 2019-12-15: qty 2

## 2019-12-15 MED ORDER — ACETAMINOPHEN 325 MG PO TABS
650.0000 mg | ORAL_TABLET | Freq: Once | ORAL | Status: AC
  Administered 2019-12-15: 650 mg via ORAL
  Filled 2019-12-15: qty 2

## 2019-12-15 MED ORDER — SODIUM CHLORIDE 0.9 % IV SOLN
375.0000 mg/m2 | Freq: Once | INTRAVENOUS | Status: AC
  Administered 2019-12-15: 800 mg via INTRAVENOUS
  Filled 2019-12-15: qty 50
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
## 2019-12-15 NOTE — Assessment & Plan Note (Addendum)
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 PET May 25th, 2021-- Moderate progression of lymphoma within the neck, chest, abdomen, and pelvis; no evidence of transformation.   # proceed with therapy with rituximab; weekly x4 infusion.  Again treatments are palliative not curative.  Discussed the potential side effects of Rituxan including but not related to infusion reactions risk of infections etc.   # DM-discussed that the use of steroids would cause increase in blood sugars; patient blood sugars at home are 100-1 20 as per patient.  Recommend checking blood sugars frequently for the next few days; and if elevated-reach out to endocrinology/us.  Patient getting dexamethasone 10 mg prior to rituximab infusion.  # Mild anemia- 12.9/likley CKD- recommend PO qOD.  Stable.  Labs with PCP.  # CKD stage III-GFR 33; diabetes- [Dr.kolluru].  Stable  # DISPOSITION:  # Rituxan today # 1 week- labs- cbc/bmp;rituxan # follow up in 2 weeks- MD; labs- cbc/bmp;Rituxan # in 3 weeks- labs- cbc/bmp;Rituxan- Dr.B  # I reviewed the blood work- with the patient in detail; also reviewed the imaging independently [as summarized above]; and with the patient in detail.

## 2019-12-15 NOTE — Progress Notes (Signed)
Patient's BP 175/94. Dr. Rogue Bussing made aware. Okay to continue infusion.

## 2019-12-22 ENCOUNTER — Inpatient Hospital Stay: Payer: Medicare PPO

## 2019-12-22 ENCOUNTER — Other Ambulatory Visit: Payer: Self-pay | Admitting: *Deleted

## 2019-12-22 ENCOUNTER — Other Ambulatory Visit: Payer: Self-pay | Admitting: Internal Medicine

## 2019-12-22 ENCOUNTER — Other Ambulatory Visit: Payer: Self-pay

## 2019-12-22 VITALS — BP 128/75 | HR 60 | Temp 97.0°F | Resp 19

## 2019-12-22 DIAGNOSIS — C8223 Follicular lymphoma grade III, unspecified, intra-abdominal lymph nodes: Secondary | ICD-10-CM

## 2019-12-22 DIAGNOSIS — Z5112 Encounter for antineoplastic immunotherapy: Secondary | ICD-10-CM | POA: Diagnosis not present

## 2019-12-22 DIAGNOSIS — Z7189 Other specified counseling: Secondary | ICD-10-CM

## 2019-12-22 LAB — CBC WITH DIFFERENTIAL/PLATELET
Abs Immature Granulocytes: 0.12 10*3/uL — ABNORMAL HIGH (ref 0.00–0.07)
Basophils Absolute: 0.1 10*3/uL (ref 0.0–0.1)
Basophils Relative: 1 %
Eosinophils Absolute: 0.2 10*3/uL (ref 0.0–0.5)
Eosinophils Relative: 2 %
HCT: 40.9 % (ref 39.0–52.0)
Hemoglobin: 14.2 g/dL (ref 13.0–17.0)
Immature Granulocytes: 1 %
Lymphocytes Relative: 14 %
Lymphs Abs: 1.4 10*3/uL (ref 0.7–4.0)
MCH: 32.8 pg (ref 26.0–34.0)
MCHC: 34.7 g/dL (ref 30.0–36.0)
MCV: 94.5 fL (ref 80.0–100.0)
Monocytes Absolute: 0.9 10*3/uL (ref 0.1–1.0)
Monocytes Relative: 9 %
Neutro Abs: 7.1 10*3/uL (ref 1.7–7.7)
Neutrophils Relative %: 73 %
Platelets: 247 10*3/uL (ref 150–400)
RBC: 4.33 MIL/uL (ref 4.22–5.81)
RDW: 13.8 % (ref 11.5–15.5)
WBC: 9.9 10*3/uL (ref 4.0–10.5)
nRBC: 0 % (ref 0.0–0.2)

## 2019-12-22 LAB — BASIC METABOLIC PANEL
Anion gap: 9 (ref 5–15)
BUN: 24 mg/dL — ABNORMAL HIGH (ref 8–23)
CO2: 28 mmol/L (ref 22–32)
Calcium: 8.7 mg/dL — ABNORMAL LOW (ref 8.9–10.3)
Chloride: 103 mmol/L (ref 98–111)
Creatinine, Ser: 1.94 mg/dL — ABNORMAL HIGH (ref 0.61–1.24)
GFR calc Af Amer: 39 mL/min — ABNORMAL LOW (ref >60)
GFR calc non Af Amer: 34 mL/min — ABNORMAL LOW (ref >60)
Glucose, Bld: 110 mg/dL — ABNORMAL HIGH (ref 70–99)
Potassium: 4.1 mmol/L (ref 3.5–5.1)
Sodium: 140 mmol/L (ref 135–145)

## 2019-12-22 MED ORDER — SODIUM CHLORIDE 0.9 % IV SOLN: 375.0000 mg/m2 | Freq: Once | INTRAVENOUS | Status: DC

## 2019-12-22 MED ORDER — SODIUM CHLORIDE 0.9 % IV SOLN
Freq: Once | INTRAVENOUS | Status: AC
  Filled 2019-12-22: qty 250

## 2019-12-22 MED ORDER — ACETAMINOPHEN 325 MG PO TABS
650.0000 mg | ORAL_TABLET | Freq: Once | ORAL | Status: AC
  Administered 2019-12-22: 650 mg via ORAL
  Filled 2019-12-22: qty 2

## 2019-12-22 MED ORDER — DIPHENHYDRAMINE HCL 25 MG PO CAPS
50.0000 mg | ORAL_CAPSULE | Freq: Once | ORAL | Status: AC
  Administered 2019-12-22: 50 mg via ORAL
  Filled 2019-12-22: qty 2

## 2019-12-22 MED ORDER — DEXAMETHASONE SODIUM PHOSPHATE 10 MG/ML IJ SOLN
8.0000 mg | Freq: Once | INTRAMUSCULAR | Status: AC
  Administered 2019-12-22: 8 mg via INTRAVENOUS
  Filled 2019-12-22: qty 1

## 2019-12-22 MED ORDER — SODIUM CHLORIDE 0.9 % IV SOLN
375.0000 mg/m2 | Freq: Once | INTRAVENOUS | Status: AC
  Administered 2019-12-22: 800 mg via INTRAVENOUS
  Filled 2019-12-22: qty 50

## 2019-12-29 ENCOUNTER — Inpatient Hospital Stay: Payer: Medicare PPO

## 2019-12-29 ENCOUNTER — Other Ambulatory Visit: Payer: Self-pay

## 2019-12-29 ENCOUNTER — Encounter: Payer: Self-pay | Admitting: Internal Medicine

## 2019-12-29 ENCOUNTER — Inpatient Hospital Stay (HOSPITAL_BASED_OUTPATIENT_CLINIC_OR_DEPARTMENT_OTHER): Payer: Medicare PPO | Admitting: Internal Medicine

## 2019-12-29 DIAGNOSIS — C8223 Follicular lymphoma grade III, unspecified, intra-abdominal lymph nodes: Secondary | ICD-10-CM

## 2019-12-29 DIAGNOSIS — Z7189 Other specified counseling: Secondary | ICD-10-CM

## 2019-12-29 DIAGNOSIS — Z5112 Encounter for antineoplastic immunotherapy: Secondary | ICD-10-CM | POA: Diagnosis not present

## 2019-12-29 LAB — CBC WITH DIFFERENTIAL/PLATELET
Abs Immature Granulocytes: 0.07 10*3/uL (ref 0.00–0.07)
Basophils Absolute: 0.1 10*3/uL (ref 0.0–0.1)
Basophils Relative: 1 %
Eosinophils Absolute: 0.2 10*3/uL (ref 0.0–0.5)
Eosinophils Relative: 2 %
HCT: 41.3 % (ref 39.0–52.0)
Hemoglobin: 14.2 g/dL (ref 13.0–17.0)
Immature Granulocytes: 1 %
Lymphocytes Relative: 13 %
Lymphs Abs: 1.4 10*3/uL (ref 0.7–4.0)
MCH: 32.2 pg (ref 26.0–34.0)
MCHC: 34.4 g/dL (ref 30.0–36.0)
MCV: 93.7 fL (ref 80.0–100.0)
Monocytes Absolute: 0.6 10*3/uL (ref 0.1–1.0)
Monocytes Relative: 6 %
Neutro Abs: 7.9 10*3/uL — ABNORMAL HIGH (ref 1.7–7.7)
Neutrophils Relative %: 77 %
Platelets: 239 10*3/uL (ref 150–400)
RBC: 4.41 MIL/uL (ref 4.22–5.81)
RDW: 13.7 % (ref 11.5–15.5)
WBC: 10.2 10*3/uL (ref 4.0–10.5)
nRBC: 0 % (ref 0.0–0.2)

## 2019-12-29 LAB — BASIC METABOLIC PANEL
Anion gap: 9 (ref 5–15)
BUN: 28 mg/dL — ABNORMAL HIGH (ref 8–23)
CO2: 26 mmol/L (ref 22–32)
Calcium: 9 mg/dL (ref 8.9–10.3)
Chloride: 103 mmol/L (ref 98–111)
Creatinine, Ser: 1.95 mg/dL — ABNORMAL HIGH (ref 0.61–1.24)
GFR calc Af Amer: 39 mL/min — ABNORMAL LOW (ref >60)
GFR calc non Af Amer: 34 mL/min — ABNORMAL LOW (ref >60)
Glucose, Bld: 186 mg/dL — ABNORMAL HIGH (ref 70–99)
Potassium: 3.8 mmol/L (ref 3.5–5.1)
Sodium: 138 mmol/L (ref 135–145)

## 2019-12-29 MED ORDER — SODIUM CHLORIDE 0.9 % IV SOLN
Freq: Once | INTRAVENOUS | Status: AC
  Filled 2019-12-29: qty 250

## 2019-12-29 MED ORDER — DEXAMETHASONE SODIUM PHOSPHATE 10 MG/ML IJ SOLN
4.0000 mg | Freq: Once | INTRAMUSCULAR | Status: AC
  Administered 2019-12-29: 4 mg via INTRAVENOUS
  Filled 2019-12-29: qty 1

## 2019-12-29 MED ORDER — SODIUM CHLORIDE 0.9 % IV SOLN
375.0000 mg/m2 | Freq: Once | INTRAVENOUS | Status: AC
  Administered 2019-12-29: 800 mg via INTRAVENOUS
  Filled 2019-12-29: qty 50

## 2019-12-29 MED ORDER — DIPHENHYDRAMINE HCL 25 MG PO CAPS
50.0000 mg | ORAL_CAPSULE | Freq: Once | ORAL | Status: AC
  Administered 2019-12-29: 50 mg via ORAL
  Filled 2019-12-29: qty 2

## 2019-12-29 MED ORDER — ACETAMINOPHEN 325 MG PO TABS
650.0000 mg | ORAL_TABLET | Freq: Once | ORAL | Status: AC
  Administered 2019-12-29: 650 mg via ORAL
  Filled 2019-12-29: qty 2
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
## 2019-12-29 NOTE — Assessment & Plan Note (Addendum)
#   Follicular lymphoma grade 3-  Recurrent/relpased-PET May 25th, 2021-- Moderate progression of lymphoma within the neck, chest, abdomen, and pelvis; no evidence of transformation. On single agent rituxan-s/p 2 treatments.   # proceed with therapy with rituximab #3  weekly infusion.Labs today reviewed;  acceptable for treatment today.   # Poorly controlled- +527 post infusion [dex; dietary indiscretion]; there after 150-230s.  We will decrease the dose of dexamethasone.  Also as the patient to call us /Endocrinology if his sugars start to go up again today.  Follow up with Endocrine next week.   # CKD stage III-GFR 34; diabetes- [Dr.kolluru].  STABLE.   # DISPOSITION:  # Rituxan today # 1 week- MD; labs- cbc/bmp;rituxan- Dr.B

## 2020-01-05 ENCOUNTER — Inpatient Hospital Stay: Payer: Medicare PPO

## 2020-01-05 ENCOUNTER — Encounter: Payer: Self-pay | Admitting: Internal Medicine

## 2020-01-05 ENCOUNTER — Other Ambulatory Visit: Payer: Self-pay

## 2020-01-05 ENCOUNTER — Inpatient Hospital Stay (HOSPITAL_BASED_OUTPATIENT_CLINIC_OR_DEPARTMENT_OTHER): Payer: Medicare PPO | Admitting: Internal Medicine

## 2020-01-05 DIAGNOSIS — C8223 Follicular lymphoma grade III, unspecified, intra-abdominal lymph nodes: Secondary | ICD-10-CM | POA: Diagnosis not present

## 2020-01-05 DIAGNOSIS — Z5112 Encounter for antineoplastic immunotherapy: Secondary | ICD-10-CM | POA: Diagnosis not present

## 2020-01-05 DIAGNOSIS — Z7189 Other specified counseling: Secondary | ICD-10-CM

## 2020-01-05 LAB — BASIC METABOLIC PANEL
Anion gap: 10 (ref 5–15)
BUN: 23 mg/dL (ref 8–23)
CO2: 25 mmol/L (ref 22–32)
Calcium: 8.9 mg/dL (ref 8.9–10.3)
Chloride: 103 mmol/L (ref 98–111)
Creatinine, Ser: 1.9 mg/dL — ABNORMAL HIGH (ref 0.61–1.24)
GFR calc Af Amer: 40 mL/min — ABNORMAL LOW (ref >60)
GFR calc non Af Amer: 35 mL/min — ABNORMAL LOW (ref >60)
Glucose, Bld: 208 mg/dL — ABNORMAL HIGH (ref 70–99)
Potassium: 4.3 mmol/L (ref 3.5–5.1)
Sodium: 138 mmol/L (ref 135–145)

## 2020-01-05 LAB — CBC WITH DIFFERENTIAL/PLATELET
Abs Immature Granulocytes: 0.07 10*3/uL (ref 0.00–0.07)
Basophils Absolute: 0.1 10*3/uL (ref 0.0–0.1)
Basophils Relative: 1 %
Eosinophils Absolute: 0.2 10*3/uL (ref 0.0–0.5)
Eosinophils Relative: 2 %
HCT: 40.6 % (ref 39.0–52.0)
Hemoglobin: 14.1 g/dL (ref 13.0–17.0)
Immature Granulocytes: 1 %
Lymphocytes Relative: 12 %
Lymphs Abs: 1 10*3/uL (ref 0.7–4.0)
MCH: 32.7 pg (ref 26.0–34.0)
MCHC: 34.7 g/dL (ref 30.0–36.0)
MCV: 94.2 fL (ref 80.0–100.0)
Monocytes Absolute: 0.6 10*3/uL (ref 0.1–1.0)
Monocytes Relative: 8 %
Neutro Abs: 6.4 10*3/uL (ref 1.7–7.7)
Neutrophils Relative %: 76 %
Platelets: 235 10*3/uL (ref 150–400)
RBC: 4.31 MIL/uL (ref 4.22–5.81)
RDW: 13.6 % (ref 11.5–15.5)
WBC: 8.5 10*3/uL (ref 4.0–10.5)
nRBC: 0 % (ref 0.0–0.2)

## 2020-01-05 MED ORDER — DEXAMETHASONE SODIUM PHOSPHATE 10 MG/ML IJ SOLN
4.0000 mg | Freq: Once | INTRAMUSCULAR | Status: AC
  Administered 2020-01-05: 4 mg via INTRAVENOUS
  Filled 2020-01-05: qty 1

## 2020-01-05 MED ORDER — ACETAMINOPHEN 325 MG PO TABS
650.0000 mg | ORAL_TABLET | Freq: Once | ORAL | Status: AC
  Administered 2020-01-05: 650 mg via ORAL
  Filled 2020-01-05: qty 2

## 2020-01-05 MED ORDER — DIPHENHYDRAMINE HCL 25 MG PO CAPS
50.0000 mg | ORAL_CAPSULE | Freq: Once | ORAL | Status: AC
  Administered 2020-01-05: 50 mg via ORAL
  Filled 2020-01-05: qty 2

## 2020-01-05 MED ORDER — SODIUM CHLORIDE 0.9 % IV SOLN
375.0000 mg/m2 | Freq: Once | INTRAVENOUS | Status: AC
  Administered 2020-01-05: 800 mg via INTRAVENOUS
  Filled 2020-01-05: qty 50

## 2020-01-05 MED ORDER — SODIUM CHLORIDE 0.9 % IV SOLN
Freq: Once | INTRAVENOUS | Status: AC
  Filled 2020-01-05: qty 250

## 2020-01-05 NOTE — Assessment & Plan Note (Addendum)
#   Follicular lymphoma grade 3-  Recurrent/relpased-PET May 25th, 2021-- Moderate progression of lymphoma within the neck, chest, abdomen, and pelvis; no evidence of transformation. On single agent rituxan-s/p 3 treatments. STABLE.   # proceed with therapy with rituximab #4  weekly infusion.Labs today reviewed;  acceptable for treatment today. Will get PET in 2 months; and also discussed re: Rituxan maintenance.    # Poorly controlled- +380 post infusion [dex; dietary indiscretion]; on sliding scale; followed up with Dr.Solum  # CKD stage III-GFR 34; diabetes- [Dr.kolluru].  STABLE.   # DISPOSITION:  # Rituxan today #follow up in 2 months MD; labs- cbc/bmp; PET scan prior- Dr.B
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
## 2020-03-06 ENCOUNTER — Encounter
Admission: RE | Admit: 2020-03-06 | Discharge: 2020-03-06 | Disposition: A | Discharge status: Home / Self Care | Payer: Medicare PPO | Source: Ambulatory Visit | Attending: Internal Medicine | Admitting: Internal Medicine

## 2020-03-06 ENCOUNTER — Other Ambulatory Visit: Payer: Self-pay | Admitting: Diagnostic Radiology

## 2020-03-06 ENCOUNTER — Other Ambulatory Visit: Payer: Self-pay

## 2020-03-06 DIAGNOSIS — C8223 Follicular lymphoma grade III, unspecified, intra-abdominal lymph nodes: Secondary | ICD-10-CM | POA: Insufficient documentation

## 2020-03-06 LAB — GLUCOSE, CAPILLARY: Glucose-Capillary: 140 mg/dL — ABNORMAL HIGH (ref 70–99)

## 2020-03-06 MED ORDER — FLUDEOXYGLUCOSE F - 18 (FDG) INJECTION
9.5000 | Freq: Once | INTRAVENOUS | Status: AC | PRN
  Administered 2020-03-06: 9.73 via INTRAVENOUS

## 2020-03-08 ENCOUNTER — Encounter: Payer: Self-pay | Admitting: Internal Medicine

## 2020-03-08 ENCOUNTER — Encounter: Payer: Self-pay | Admitting: Pharmacist

## 2020-03-08 ENCOUNTER — Telehealth: Payer: Self-pay | Admitting: Internal Medicine

## 2020-03-08 ENCOUNTER — Inpatient Hospital Stay (HOSPITAL_BASED_OUTPATIENT_CLINIC_OR_DEPARTMENT_OTHER): Payer: Medicare PPO | Admitting: Internal Medicine

## 2020-03-08 ENCOUNTER — Other Ambulatory Visit: Payer: Self-pay

## 2020-03-08 ENCOUNTER — Telehealth: Payer: Self-pay | Admitting: Pharmacy Technician

## 2020-03-08 ENCOUNTER — Inpatient Hospital Stay: Payer: Medicare PPO | Attending: Internal Medicine

## 2020-03-08 DIAGNOSIS — C8223 Follicular lymphoma grade III, unspecified, intra-abdominal lymph nodes: Secondary | ICD-10-CM | POA: Diagnosis present

## 2020-03-08 DIAGNOSIS — Z794 Long term (current) use of insulin: Secondary | ICD-10-CM | POA: Diagnosis not present

## 2020-03-08 DIAGNOSIS — Z9221 Personal history of antineoplastic chemotherapy: Secondary | ICD-10-CM | POA: Insufficient documentation

## 2020-03-08 DIAGNOSIS — E119 Type 2 diabetes mellitus without complications: Secondary | ICD-10-CM | POA: Insufficient documentation

## 2020-03-08 DIAGNOSIS — I10 Essential (primary) hypertension: Secondary | ICD-10-CM | POA: Diagnosis not present

## 2020-03-08 DIAGNOSIS — N183 Chronic kidney disease, stage 3 unspecified: Secondary | ICD-10-CM | POA: Diagnosis not present

## 2020-03-08 DIAGNOSIS — Z7982 Long term (current) use of aspirin: Secondary | ICD-10-CM | POA: Diagnosis not present

## 2020-03-08 DIAGNOSIS — Z79899 Other long term (current) drug therapy: Secondary | ICD-10-CM | POA: Diagnosis not present

## 2020-03-08 DIAGNOSIS — Z87891 Personal history of nicotine dependence: Secondary | ICD-10-CM | POA: Insufficient documentation

## 2020-03-08 LAB — CBC WITH DIFFERENTIAL/PLATELET
Abs Immature Granulocytes: 0.07 10*3/uL (ref 0.00–0.07)
Basophils Absolute: 0.1 10*3/uL (ref 0.0–0.1)
Basophils Relative: 1 %
Eosinophils Absolute: 0.3 10*3/uL (ref 0.0–0.5)
Eosinophils Relative: 3 %
HCT: 38.8 % — ABNORMAL LOW (ref 39.0–52.0)
Hemoglobin: 13.6 g/dL (ref 13.0–17.0)
Immature Granulocytes: 1 %
Lymphocytes Relative: 15 %
Lymphs Abs: 1.3 10*3/uL (ref 0.7–4.0)
MCH: 32.9 pg (ref 26.0–34.0)
MCHC: 35.1 g/dL (ref 30.0–36.0)
MCV: 93.7 fL (ref 80.0–100.0)
Monocytes Absolute: 0.9 10*3/uL (ref 0.1–1.0)
Monocytes Relative: 10 %
Neutro Abs: 6.3 10*3/uL (ref 1.7–7.7)
Neutrophils Relative %: 70 %
Platelets: 271 10*3/uL (ref 150–400)
RBC: 4.14 MIL/uL — ABNORMAL LOW (ref 4.22–5.81)
RDW: 13.6 % (ref 11.5–15.5)
WBC: 9 10*3/uL (ref 4.0–10.5)
nRBC: 0 % (ref 0.0–0.2)

## 2020-03-08 LAB — BASIC METABOLIC PANEL
Anion gap: 9 (ref 5–15)
BUN: 25 mg/dL — ABNORMAL HIGH (ref 8–23)
CO2: 25 mmol/L (ref 22–32)
Calcium: 8.7 mg/dL — ABNORMAL LOW (ref 8.9–10.3)
Chloride: 105 mmol/L (ref 98–111)
Creatinine, Ser: 2 mg/dL — ABNORMAL HIGH (ref 0.61–1.24)
GFR calc Af Amer: 38 mL/min — ABNORMAL LOW (ref >60)
GFR calc non Af Amer: 33 mL/min — ABNORMAL LOW (ref >60)
Glucose, Bld: 121 mg/dL — ABNORMAL HIGH (ref 70–99)
Potassium: 4.4 mmol/L (ref 3.5–5.1)
Sodium: 139 mmol/L (ref 135–145)

## 2020-03-08 MED ORDER — LENALIDOMIDE 10 MG PO CAPS: 10.0000 mg | ORAL_CAPSULE | Freq: Every day | ORAL | 0 refills | Status: DC

## 2020-03-08 NOTE — Telephone Encounter (Addendum)
Oral Oncology Patient Advocate Encounter   Received notification from [facility] that prior authorization for Revlimid is required.   PA submitted on CoverMyMeds Key [id number] Status is pending   Oral Oncology Clinic will continue to follow.  [place] [facility] [place] Phone [phone] Fax [phone] 03/09/2020 11:29 AM
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
## 2020-03-08 NOTE — Progress Notes (Signed)
Patient enrolled and consent for Revlimid REMS program. Prescription sent to [facility].

## 2020-03-08 NOTE — Assessment & Plan Note (Addendum)
#   Follicular lymphoma grade 3- Recurrent/relapased-s/p single agent rituximab for treatments-PET scan 03/06/2020-mixed response-improvement  lymph nodes within the upper retroesophageal, left retrocrural, left retroperitoneal, and right retroperitoneal region; stable right parotid left mediastinal left posterior mediastinal lymph nodes; new FDG avid aortocaval lymph node [difficult to Biopsy].   #Given the lack of significant response to single agent therapy-discussed option of biopsy versus discussion of therapy-addition of rituximab; bendamustine etc.  #Given patient's renal insufficiency/visual disability-I think it is reasonable to add Revlimid [renally dosed]-10 mg a day 3 weeks on 1 week off.  Understands treatments are palliative not curative.  Discussed the potential side effects of Revlimid including but not limited to diarrhea skin rash thromboembolic events.  Recommend aspirin.  Also discussed the potential teratogenic side effects; and also enrolled in REMs program.  #Diabetes-poorly controlled especially on steroids.  Follow-up with endocrinology closely.  # CKD stage III-GFR 34; diabetes- [Dr.kolluru].  Stable  # DISPOSITION:  #follow up in 2 weeks- MD; labs- cbc/cmp; Rittuxan SQ- Dr.B

## 2020-03-08 NOTE — Telephone Encounter (Signed)
x

## 2020-03-09 NOTE — Telephone Encounter (Signed)
Oral Oncology Patient Advocate Encounter  Prior Authorization for Revlimid has been approved.    PA# [id number] Effective dates: 03/09/20 through 09/05/20  Patients co-pay is $100.00.  Rx must be filled with [facility].  Oral Oncology Clinic will continue to follow.   [place] Patient [place] Phone [phone] Fax [phone] 03/09/2020 11:31 AM

## 2020-03-09 NOTE — Progress Notes (Signed)
ON PATHWAY REGIMEN - Lymphoma and CLL  No Change  Continue With Treatment as Ordered.  Original Decision Date/Time: 11/25/2019 15:07     Administer weekly:     Rituximab-xxxx   **Always confirm dose/schedule in your pharmacy ordering system**  Patient Characteristics: Follicular Lymphoma, Grades 1, 2, and 3A, Second Line, Prior Treatment with Rituximab Alone, Relapse ? 24 Months Disease Type: Follicular Lymphoma, Grade 1, 2, or 3A Disease Type: Not Applicable Disease Type: Not Applicable [place] Stage: III Line of Therapy: Second Line Prior Treatment: Prior Treatment with Rituximab Alone Time to Relapse: Relapse ? 24 Months Intent of Therapy: Non-Curative / Palliative Intent, Discussed with Patient
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
## 2020-03-14 ENCOUNTER — Other Ambulatory Visit: Payer: Self-pay | Admitting: Internal Medicine

## 2020-03-15 NOTE — Progress Notes (Signed)
Oral Chemotherapy Pharmacist Encounter   Spoke with Mrs. Nordhoff, the Revlimid has been delivered from [facility]. They know to hold on getting started until his appt on 03/21/20.  Darl Pikes, PharmD, BCPS, BCOP, CPP Hematology/Oncology Clinical Pharmacist [facility] [phone]  03/15/2020 8:59 AM

## 2020-03-21 ENCOUNTER — Encounter: Payer: Self-pay | Admitting: Internal Medicine

## 2020-03-21 ENCOUNTER — Inpatient Hospital Stay: Payer: Medicare PPO | Attending: Internal Medicine

## 2020-03-21 ENCOUNTER — Inpatient Hospital Stay: Payer: Medicare PPO

## 2020-03-21 ENCOUNTER — Inpatient Hospital Stay (HOSPITAL_BASED_OUTPATIENT_CLINIC_OR_DEPARTMENT_OTHER): Payer: Medicare PPO | Admitting: Internal Medicine

## 2020-03-21 ENCOUNTER — Other Ambulatory Visit: Payer: Self-pay

## 2020-03-21 VITALS — BP 158/87 | HR 60 | Temp 96.5°F | Resp 17

## 2020-03-21 DIAGNOSIS — E1122 Type 2 diabetes mellitus with diabetic chronic kidney disease: Secondary | ICD-10-CM | POA: Diagnosis not present

## 2020-03-21 DIAGNOSIS — C8223 Follicular lymphoma grade III, unspecified, intra-abdominal lymph nodes: Secondary | ICD-10-CM | POA: Diagnosis not present

## 2020-03-21 DIAGNOSIS — I129 Hypertensive chronic kidney disease with stage 1 through stage 4 chronic kidney disease, or unspecified chronic kidney disease: Secondary | ICD-10-CM | POA: Insufficient documentation

## 2020-03-21 DIAGNOSIS — Z87891 Personal history of nicotine dependence: Secondary | ICD-10-CM | POA: Insufficient documentation

## 2020-03-21 DIAGNOSIS — R5381 Other malaise: Secondary | ICD-10-CM | POA: Insufficient documentation

## 2020-03-21 DIAGNOSIS — Z5112 Encounter for antineoplastic immunotherapy: Secondary | ICD-10-CM | POA: Diagnosis not present

## 2020-03-21 DIAGNOSIS — Z7189 Other specified counseling: Secondary | ICD-10-CM

## 2020-03-21 DIAGNOSIS — Z7982 Long term (current) use of aspirin: Secondary | ICD-10-CM | POA: Insufficient documentation

## 2020-03-21 DIAGNOSIS — Z923 Personal history of irradiation: Secondary | ICD-10-CM | POA: Diagnosis not present

## 2020-03-21 DIAGNOSIS — R5383 Other fatigue: Secondary | ICD-10-CM | POA: Insufficient documentation

## 2020-03-21 DIAGNOSIS — N183 Chronic kidney disease, stage 3 unspecified: Secondary | ICD-10-CM | POA: Insufficient documentation

## 2020-03-21 DIAGNOSIS — E1165 Type 2 diabetes mellitus with hyperglycemia: Secondary | ICD-10-CM | POA: Insufficient documentation

## 2020-03-21 DIAGNOSIS — Z79899 Other long term (current) drug therapy: Secondary | ICD-10-CM | POA: Insufficient documentation

## 2020-03-21 DIAGNOSIS — Z794 Long term (current) use of insulin: Secondary | ICD-10-CM | POA: Insufficient documentation

## 2020-03-21 DIAGNOSIS — Z9221 Personal history of antineoplastic chemotherapy: Secondary | ICD-10-CM | POA: Diagnosis not present

## 2020-03-21 LAB — CBC WITH DIFFERENTIAL/PLATELET
Abs Immature Granulocytes: 0.05 10*3/uL (ref 0.00–0.07)
Basophils Absolute: 0.1 10*3/uL (ref 0.0–0.1)
Basophils Relative: 1 %
Eosinophils Absolute: 0.3 10*3/uL (ref 0.0–0.5)
Eosinophils Relative: 3 %
HCT: 39.3 % (ref 39.0–52.0)
Hemoglobin: 13.5 g/dL (ref 13.0–17.0)
Immature Granulocytes: 1 %
Lymphocytes Relative: 16 %
Lymphs Abs: 1.5 10*3/uL (ref 0.7–4.0)
MCH: 32.3 pg (ref 26.0–34.0)
MCHC: 34.4 g/dL (ref 30.0–36.0)
MCV: 94 fL (ref 80.0–100.0)
Monocytes Absolute: 0.9 10*3/uL (ref 0.1–1.0)
Monocytes Relative: 10 %
Neutro Abs: 6.3 10*3/uL (ref 1.7–7.7)
Neutrophils Relative %: 69 %
Platelets: 236 10*3/uL (ref 150–400)
RBC: 4.18 MIL/uL — ABNORMAL LOW (ref 4.22–5.81)
RDW: 13.7 % (ref 11.5–15.5)
WBC: 9.1 10*3/uL (ref 4.0–10.5)
nRBC: 0 % (ref 0.0–0.2)

## 2020-03-21 LAB — COMPREHENSIVE METABOLIC PANEL
ALT: 41 U/L (ref 0–44)
AST: 29 U/L (ref 15–41)
Albumin: 3.8 g/dL (ref 3.5–5.0)
Alkaline Phosphatase: 53 U/L (ref 38–126)
Anion gap: 11 (ref 5–15)
BUN: 25 mg/dL — ABNORMAL HIGH (ref 8–23)
CO2: 24 mmol/L (ref 22–32)
Calcium: 8.6 mg/dL — ABNORMAL LOW (ref 8.9–10.3)
Chloride: 103 mmol/L (ref 98–111)
Creatinine, Ser: 1.95 mg/dL — ABNORMAL HIGH (ref 0.61–1.24)
GFR calc Af Amer: 39 mL/min — ABNORMAL LOW (ref >60)
GFR calc non Af Amer: 34 mL/min — ABNORMAL LOW (ref >60)
Glucose, Bld: 143 mg/dL — ABNORMAL HIGH (ref 70–99)
Potassium: 4 mmol/L (ref 3.5–5.1)
Sodium: 138 mmol/L (ref 135–145)
Total Bilirubin: 0.5 mg/dL (ref 0.3–1.2)
Total Protein: 6.6 g/dL (ref 6.5–8.1)

## 2020-03-21 MED ORDER — DIPHENHYDRAMINE HCL 25 MG PO CAPS
50.0000 mg | ORAL_CAPSULE | Freq: Once | ORAL | Status: AC
  Administered 2020-03-21: 50 mg via ORAL
  Filled 2020-03-21: qty 2

## 2020-03-21 MED ORDER — ACETAMINOPHEN 325 MG PO TABS
650.0000 mg | ORAL_TABLET | Freq: Once | ORAL | Status: AC
  Administered 2020-03-21: 650 mg via ORAL
  Filled 2020-03-21: qty 2

## 2020-03-21 MED ORDER — SODIUM CHLORIDE 0.9 % IV SOLN
375.0000 mg/m2 | Freq: Once | INTRAVENOUS | Status: AC
  Administered 2020-03-21: 800 mg via INTRAVENOUS
  Filled 2020-03-21: qty 50

## 2020-03-21 MED ORDER — SODIUM CHLORIDE 0.9 % IV SOLN
Freq: Once | INTRAVENOUS | Status: AC
  Filled 2020-03-21: qty 250
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
## 2020-03-21 NOTE — Progress Notes (Signed)
No s/s of distress or reaction noted. Pt and VS stable at discharge.

## 2020-03-21 NOTE — Assessment & Plan Note (Addendum)
#   Follicular lymphoma grade 3- Recurrent/relapased-s/p single agent rituximab for treatments-PET scan 03/06/2020-mixed response-improvement  lymph nodes within the upper retroesophageal, left retrocrural, left retroperitoneal, and right retroperitoneal region; stable right parotid left mediastinal left posterior mediastinal lymph nodes; new FDG avid aortocaval lymph node.  # proceed with Rituxan- Revlimid [10 mg renally dosed; 3 weeks on 1 week off.  Understands treatments are palliative not curative.]  Again reviewed the potential side effects of Revlimid; including but not limited to teratogenic effects blood clots/aspirin prophylaxis.  Rash diarrhea.  #Diabetes-poorly controlled especially on steroids; BG- 143.  Follow-up with endocrinology closely.  # CKD stage III-GFR 34; diabetes- [Dr.kolluru].  Stable  # Discussed re: asprin 81 mg/day prophylaxis for DVT/revlimid.   # DISPOSITION:  # Rituxan today #follow up in 2 weeks- MD; labs- cbc/cmp;  Dr.B

## 2020-03-29 ENCOUNTER — Other Ambulatory Visit: Payer: Self-pay | Admitting: Internal Medicine

## 2020-03-29 DIAGNOSIS — C8223 Follicular lymphoma grade III, unspecified, intra-abdominal lymph nodes: Secondary | ICD-10-CM

## 2020-03-29 NOTE — Telephone Encounter (Signed)
This script was auto RF. Not time to do survey for patient. Confirmed with patient that patient did not start on medication until 03/21/2020

## 2020-03-30 ENCOUNTER — Telehealth: Payer: Self-pay | Admitting: *Deleted

## 2020-03-30 NOTE — Telephone Encounter (Signed)
Patient called and states that he broke a tooth and would like to get it fixed. He is on treatment and is asking if that would be alright to have it fixed next week. He does not have an appointment yet. Please advise

## 2020-03-30 NOTE — Telephone Encounter (Signed)
Please advise Dr. B 

## 2020-04-02 NOTE — Telephone Encounter (Signed)
Patient has called again this morning asking if he can get 2 broken teeth repaired. Please advise

## 2020-04-02 NOTE — Telephone Encounter (Signed)
Call retruned to patient and informe dof physician response. He states that nothing will be pulled, but rather filled. I advised him that if the dentist has any questions that he can call and speak with Dr Donzetta Matters

## 2020-04-02 NOTE — Telephone Encounter (Signed)
Mitchell Chang- please inform pt that he can have his tooth evaluated by dentist; but hold off any extraction unless absolutely needed. Will discuss further at next visit in 2 days.  GB

## 2020-04-03 ENCOUNTER — Other Ambulatory Visit: Payer: Self-pay

## 2020-04-04 ENCOUNTER — Inpatient Hospital Stay (HOSPITAL_BASED_OUTPATIENT_CLINIC_OR_DEPARTMENT_OTHER): Payer: Medicare PPO | Admitting: Internal Medicine

## 2020-04-04 ENCOUNTER — Inpatient Hospital Stay: Payer: Medicare PPO

## 2020-04-04 DIAGNOSIS — C8223 Follicular lymphoma grade III, unspecified, intra-abdominal lymph nodes: Secondary | ICD-10-CM | POA: Diagnosis not present

## 2020-04-04 DIAGNOSIS — Z5112 Encounter for antineoplastic immunotherapy: Secondary | ICD-10-CM | POA: Diagnosis not present

## 2020-04-04 LAB — CBC WITH DIFFERENTIAL/PLATELET
Abs Immature Granulocytes: 0.18 10*3/uL — ABNORMAL HIGH (ref 0.00–0.07)
Basophils Absolute: 0.1 10*3/uL (ref 0.0–0.1)
Basophils Relative: 1 %
Eosinophils Absolute: 0.5 10*3/uL (ref 0.0–0.5)
Eosinophils Relative: 5 %
HCT: 36.6 % — ABNORMAL LOW (ref 39.0–52.0)
Hemoglobin: 12.9 g/dL — ABNORMAL LOW (ref 13.0–17.0)
Immature Granulocytes: 2 %
Lymphocytes Relative: 14 %
Lymphs Abs: 1.2 10*3/uL (ref 0.7–4.0)
MCH: 32.8 pg (ref 26.0–34.0)
MCHC: 35.2 g/dL (ref 30.0–36.0)
MCV: 93.1 fL (ref 80.0–100.0)
Monocytes Absolute: 1.2 10*3/uL — ABNORMAL HIGH (ref 0.1–1.0)
Monocytes Relative: 13 %
Neutro Abs: 5.9 10*3/uL (ref 1.7–7.7)
Neutrophils Relative %: 65 %
Platelets: 202 10*3/uL (ref 150–400)
RBC: 3.93 MIL/uL — ABNORMAL LOW (ref 4.22–5.81)
RDW: 13.4 % (ref 11.5–15.5)
WBC: 9 10*3/uL (ref 4.0–10.5)
nRBC: 0 % (ref 0.0–0.2)

## 2020-04-04 LAB — COMPREHENSIVE METABOLIC PANEL
ALT: 45 U/L — ABNORMAL HIGH (ref 0–44)
AST: 26 U/L (ref 15–41)
Albumin: 3.5 g/dL (ref 3.5–5.0)
Alkaline Phosphatase: 55 U/L (ref 38–126)
Anion gap: 8 (ref 5–15)
BUN: 21 mg/dL (ref 8–23)
CO2: 29 mmol/L (ref 22–32)
Calcium: 8.1 mg/dL — ABNORMAL LOW (ref 8.9–10.3)
Chloride: 102 mmol/L (ref 98–111)
Creatinine, Ser: 1.95 mg/dL — ABNORMAL HIGH (ref 0.61–1.24)
GFR calc Af Amer: 39 mL/min — ABNORMAL LOW (ref >60)
GFR calc non Af Amer: 34 mL/min — ABNORMAL LOW (ref >60)
Glucose, Bld: 98 mg/dL (ref 70–99)
Potassium: 3.9 mmol/L (ref 3.5–5.1)
Sodium: 139 mmol/L (ref 135–145)
Total Bilirubin: 0.6 mg/dL (ref 0.3–1.2)
Total Protein: 6.3 g/dL — ABNORMAL LOW (ref 6.5–8.1)
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
## 2020-04-04 NOTE — Assessment & Plan Note (Addendum)
#   Follicular lymphoma grade 3- Recurrent/relapased-s/p single agent rituximab for treatments-PET scan 03/06/2020-mixed response-improvement  lymph nodes within the upper retroesophageal, left retrocrural, left retroperitoneal, and right retroperitoneal region; stable right parotid left mediastinal left posterior mediastinal lymph nodes; new FDG avid aortocaval lymph node. On Rituxan- Revlimid [10 mg renally dosed; 3 weeks on 1 week off.   # Currently on Revlimid cycle 1;da-14/of 21 today. Labs today reviewed;  acceptable for treatment today- will plan to increase with cycle #3 if tolearting well. Reminded to call or pharmacy for refill of Revlimid.  #Diabetes/brittle- -poorly controlled especially on steroids; BG- 98    # CKD stage III-GFR 34; diabetes- [Dr.kolluru]. STABLE.   # Discussed re: asprin 81 mg/day prophylaxis for DVT/revlimid.   # DISPOSITION:  #follow up in 2 weeks- MD; labs- cbc/cmp;Rituxan-  Dr.B

## 2020-04-10 ENCOUNTER — Other Ambulatory Visit: Payer: Self-pay | Admitting: *Deleted

## 2020-04-10 MED ORDER — LENALIDOMIDE 10 MG PO CAPS: 10.0000 mg | ORAL_CAPSULE | Freq: Every day | ORAL | 0 refills | Status: DC

## 2020-04-10 MED ORDER — LENALIDOMIDE 10 MG PO CAPS
10.0000 mg | ORAL_CAPSULE | Freq: Every day | ORAL | 0 refills | Status: DC
Start: 2020-04-10 — End: 2020-04-10

## 2020-04-10 NOTE — Addendum Note (Signed)
Addended by: Jarrett Soho C on: 04/10/2020 11:06 AM   Modules accepted: Orders

## 2020-04-17 ENCOUNTER — Encounter: Payer: Self-pay | Admitting: Internal Medicine

## 2020-04-17 NOTE — Progress Notes (Signed)
Patient called for pre assessment. Denies any pain or concerns at this time. Just reports feeling tired and would like to know if he is ok to get a flu shot. Denies other questions at this time.

## 2020-04-18 ENCOUNTER — Other Ambulatory Visit: Payer: Self-pay

## 2020-04-18 ENCOUNTER — Inpatient Hospital Stay: Payer: Medicare PPO | Attending: Internal Medicine

## 2020-04-18 ENCOUNTER — Inpatient Hospital Stay: Payer: Medicare PPO

## 2020-04-18 ENCOUNTER — Inpatient Hospital Stay (HOSPITAL_BASED_OUTPATIENT_CLINIC_OR_DEPARTMENT_OTHER): Payer: Medicare PPO | Admitting: Internal Medicine

## 2020-04-18 DIAGNOSIS — Z87891 Personal history of nicotine dependence: Secondary | ICD-10-CM | POA: Insufficient documentation

## 2020-04-18 DIAGNOSIS — Z5112 Encounter for antineoplastic immunotherapy: Secondary | ICD-10-CM | POA: Insufficient documentation

## 2020-04-18 DIAGNOSIS — C8223 Follicular lymphoma grade III, unspecified, intra-abdominal lymph nodes: Secondary | ICD-10-CM

## 2020-04-18 DIAGNOSIS — N183 Chronic kidney disease, stage 3 unspecified: Secondary | ICD-10-CM | POA: Insufficient documentation

## 2020-04-18 DIAGNOSIS — Z7982 Long term (current) use of aspirin: Secondary | ICD-10-CM | POA: Diagnosis not present

## 2020-04-18 DIAGNOSIS — Z23 Encounter for immunization: Secondary | ICD-10-CM | POA: Insufficient documentation

## 2020-04-18 DIAGNOSIS — Z7189 Other specified counseling: Secondary | ICD-10-CM

## 2020-04-18 DIAGNOSIS — H547 Unspecified visual loss: Secondary | ICD-10-CM | POA: Insufficient documentation

## 2020-04-18 DIAGNOSIS — Z794 Long term (current) use of insulin: Secondary | ICD-10-CM | POA: Diagnosis not present

## 2020-04-18 DIAGNOSIS — Z79899 Other long term (current) drug therapy: Secondary | ICD-10-CM | POA: Diagnosis not present

## 2020-04-18 DIAGNOSIS — E119 Type 2 diabetes mellitus without complications: Secondary | ICD-10-CM | POA: Diagnosis not present

## 2020-04-18 DIAGNOSIS — I10 Essential (primary) hypertension: Secondary | ICD-10-CM | POA: Insufficient documentation

## 2020-04-18 DIAGNOSIS — C8293 Follicular lymphoma, unspecified, intra-abdominal lymph nodes: Secondary | ICD-10-CM | POA: Insufficient documentation

## 2020-04-18 LAB — CBC WITH DIFFERENTIAL/PLATELET
Abs Immature Granulocytes: 0.05 10*3/uL (ref 0.00–0.07)
Basophils Absolute: 0.2 10*3/uL — ABNORMAL HIGH (ref 0.0–0.1)
Basophils Relative: 3 %
Eosinophils Absolute: 0.1 10*3/uL (ref 0.0–0.5)
Eosinophils Relative: 2 %
HCT: 37.1 % — ABNORMAL LOW (ref 39.0–52.0)
Hemoglobin: 12.8 g/dL — ABNORMAL LOW (ref 13.0–17.0)
Immature Granulocytes: 1 %
Lymphocytes Relative: 17 %
Lymphs Abs: 1 10*3/uL (ref 0.7–4.0)
MCH: 32.7 pg (ref 26.0–34.0)
MCHC: 34.5 g/dL (ref 30.0–36.0)
MCV: 94.6 fL (ref 80.0–100.0)
Monocytes Absolute: 0.9 10*3/uL (ref 0.1–1.0)
Monocytes Relative: 15 %
Neutro Abs: 3.8 10*3/uL (ref 1.7–7.7)
Neutrophils Relative %: 62 %
Platelets: 235 10*3/uL (ref 150–400)
RBC: 3.92 MIL/uL — ABNORMAL LOW (ref 4.22–5.81)
RDW: 13.9 % (ref 11.5–15.5)
WBC: 6 10*3/uL (ref 4.0–10.5)
nRBC: 0 % (ref 0.0–0.2)

## 2020-04-18 LAB — COMPREHENSIVE METABOLIC PANEL
ALT: 33 U/L (ref 0–44)
AST: 25 U/L (ref 15–41)
Albumin: 3.5 g/dL (ref 3.5–5.0)
Alkaline Phosphatase: 56 U/L (ref 38–126)
Anion gap: 8 (ref 5–15)
BUN: 29 mg/dL — ABNORMAL HIGH (ref 8–23)
CO2: 25 mmol/L (ref 22–32)
Calcium: 8.3 mg/dL — ABNORMAL LOW (ref 8.9–10.3)
Chloride: 107 mmol/L (ref 98–111)
Creatinine, Ser: 1.88 mg/dL — ABNORMAL HIGH (ref 0.61–1.24)
GFR calc non Af Amer: 35 mL/min — ABNORMAL LOW (ref >60)
Glucose, Bld: 67 mg/dL — ABNORMAL LOW (ref 70–99)
Potassium: 4.2 mmol/L (ref 3.5–5.1)
Sodium: 140 mmol/L (ref 135–145)
Total Bilirubin: 0.6 mg/dL (ref 0.3–1.2)
Total Protein: 6.7 g/dL (ref 6.5–8.1)

## 2020-04-18 MED ORDER — INFLUENZA VAC A&B SA ADJ QUAD 0.5 ML IM PRSY
0.5000 mL | PREFILLED_SYRINGE | Freq: Once | INTRAMUSCULAR | Status: AC
  Administered 2020-04-18: 0.5 mL via INTRAMUSCULAR
  Filled 2020-04-18: qty 0.5

## 2020-04-18 MED ORDER — SODIUM CHLORIDE 0.9 % IV SOLN
Freq: Once | INTRAVENOUS | Status: AC
  Filled 2020-04-18: qty 250

## 2020-04-18 MED ORDER — SODIUM CHLORIDE 0.9 % IV SOLN
375.0000 mg/m2 | Freq: Once | INTRAVENOUS | Status: AC
  Administered 2020-04-18: 800 mg via INTRAVENOUS
  Filled 2020-04-18: qty 50

## 2020-04-18 MED ORDER — DIPHENHYDRAMINE HCL 25 MG PO CAPS
50.0000 mg | ORAL_CAPSULE | Freq: Once | ORAL | Status: AC
  Administered 2020-04-18: 50 mg via ORAL
  Filled 2020-04-18: qty 2

## 2020-04-18 MED ORDER — ACETAMINOPHEN 325 MG PO TABS
650.0000 mg | ORAL_TABLET | Freq: Once | ORAL | Status: AC
  Administered 2020-04-18: 650 mg via ORAL
  Filled 2020-04-18: qty 2

## 2020-04-18 NOTE — Assessment & Plan Note (Addendum)
#   Follicular lymphoma grade 3- Recurrent/relapased-s/p single agent rituximab for treatments-PET scan 03/06/2020-mixed response-improvement  lymph nodes within the upper retroesophageal, left retrocrural, left retroperitoneal, and right retroperitoneal region; stable right parotid left mediastinal left posterior mediastinal lymph nodes; new FDG avid aortocaval lymph node. On Rituxan- Revlimid [10 mg renally dosed; 3 weeks on 1 week off.   # proceed with cycle 2;day-1 of revlimid. Labs today reviewed;  acceptable for treatment today- will plan to increase with cycle #3 if tolearting well. Proceed with Rituxan today.   #Diabetes/brittle- -poorly controlled especially on steroids; BG-67   # CKD stage III-GFR 35; diabetes- [Dr.kolluru].STABLE.    # Discussed re: asprin 81 mg/day prophylaxis for DVT/revlimid.   # DISPOSITION:  # flu shot today # Rituxan today #follow up in 2 weeks- MD; labs- cbc/cmp--  Dr.B
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
## 2020-04-29 ENCOUNTER — Other Ambulatory Visit: Payer: Self-pay | Admitting: Internal Medicine

## 2020-04-29 DIAGNOSIS — C8223 Follicular lymphoma grade III, unspecified, intra-abdominal lymph nodes: Secondary | ICD-10-CM

## 2020-04-30 ENCOUNTER — Telehealth: Payer: Self-pay | Admitting: *Deleted

## 2020-04-30 NOTE — Telephone Encounter (Signed)
Reviewed script with Dr. Rogue Bussing - Too soon to RF RX at this time time. Rems survey not due until 10/28. [place]

## 2020-04-30 NOTE — Telephone Encounter (Signed)
-----   Message from Cammie Sickle, MD sent at 04/29/2020  8:19 AM EDT ----- Pt will need Revlimid 15 mg- 3weeks-ON; 1 wee-OFF.  Thanks GB

## 2020-04-30 NOTE — Telephone Encounter (Signed)
I spoke with Dr. Rogue Bussing - MD aware that celegene rems can not be done until ?May 10, 2020. Lovena Le, this will need to be done next week.

## 2020-05-02 ENCOUNTER — Other Ambulatory Visit: Payer: Self-pay

## 2020-05-02 ENCOUNTER — Encounter: Payer: Self-pay | Admitting: Internal Medicine

## 2020-05-02 ENCOUNTER — Inpatient Hospital Stay: Payer: Medicare PPO

## 2020-05-02 ENCOUNTER — Telehealth: Payer: Self-pay | Admitting: *Deleted

## 2020-05-02 ENCOUNTER — Inpatient Hospital Stay (HOSPITAL_BASED_OUTPATIENT_CLINIC_OR_DEPARTMENT_OTHER): Payer: Medicare PPO | Admitting: Internal Medicine

## 2020-05-02 DIAGNOSIS — C8223 Follicular lymphoma grade III, unspecified, intra-abdominal lymph nodes: Secondary | ICD-10-CM | POA: Diagnosis not present

## 2020-05-02 DIAGNOSIS — Z5112 Encounter for antineoplastic immunotherapy: Secondary | ICD-10-CM | POA: Diagnosis not present

## 2020-05-02 LAB — CBC WITH DIFFERENTIAL/PLATELET
Abs Immature Granulocytes: 0.05 10*3/uL (ref 0.00–0.07)
Basophils Absolute: 0.2 10*3/uL — ABNORMAL HIGH (ref 0.0–0.1)
Basophils Relative: 3 %
Eosinophils Absolute: 0.4 10*3/uL (ref 0.0–0.5)
Eosinophils Relative: 6 %
HCT: 34.6 % — ABNORMAL LOW (ref 39.0–52.0)
Hemoglobin: 11.6 g/dL — ABNORMAL LOW (ref 13.0–17.0)
Immature Granulocytes: 1 %
Lymphocytes Relative: 10 %
Lymphs Abs: 0.6 10*3/uL — ABNORMAL LOW (ref 0.7–4.0)
MCH: 31.4 pg (ref 26.0–34.0)
MCHC: 33.5 g/dL (ref 30.0–36.0)
MCV: 93.8 fL (ref 80.0–100.0)
Monocytes Absolute: 0.9 10*3/uL (ref 0.1–1.0)
Monocytes Relative: 15 %
Neutro Abs: 3.9 10*3/uL (ref 1.7–7.7)
Neutrophils Relative %: 65 %
Platelets: 141 10*3/uL — ABNORMAL LOW (ref 150–400)
RBC: 3.69 MIL/uL — ABNORMAL LOW (ref 4.22–5.81)
RDW: 13.7 % (ref 11.5–15.5)
WBC: 6.1 10*3/uL (ref 4.0–10.5)
nRBC: 0 % (ref 0.0–0.2)

## 2020-05-02 LAB — COMPREHENSIVE METABOLIC PANEL
ALT: 38 U/L (ref 0–44)
AST: 26 U/L (ref 15–41)
Albumin: 3.1 g/dL — ABNORMAL LOW (ref 3.5–5.0)
Alkaline Phosphatase: 53 U/L (ref 38–126)
Anion gap: 9 (ref 5–15)
BUN: 24 mg/dL — ABNORMAL HIGH (ref 8–23)
CO2: 25 mmol/L (ref 22–32)
Calcium: 8.4 mg/dL — ABNORMAL LOW (ref 8.9–10.3)
Chloride: 106 mmol/L (ref 98–111)
Creatinine, Ser: 1.95 mg/dL — ABNORMAL HIGH (ref 0.61–1.24)
GFR, Estimated: 34 mL/min — ABNORMAL LOW (ref >60)
Glucose, Bld: 123 mg/dL — ABNORMAL HIGH (ref 70–99)
Potassium: 3.9 mmol/L (ref 3.5–5.1)
Sodium: 140 mmol/L (ref 135–145)
Total Bilirubin: 0.6 mg/dL (ref 0.3–1.2)
Total Protein: 6.1 g/dL — ABNORMAL LOW (ref 6.5–8.1)

## 2020-05-02 MED ORDER — LENALIDOMIDE 15 MG PO CAPS
15.0000 mg | ORAL_CAPSULE | Freq: Every day | ORAL | 0 refills | Status: DC
Start: 2020-05-02 — End: 2020-05-02

## 2020-05-02 MED ORDER — LENALIDOMIDE 15 MG PO CAPS: 15.0000 mg | ORAL_CAPSULE | Freq: Every day | ORAL | 0 refills | Status: DC
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
## 2020-05-02 NOTE — Telephone Encounter (Signed)
Prescription for revlimid dosing submitted to [facility]

## 2020-05-02 NOTE — Progress Notes (Signed)
Pt presents today with low energy and very fatigue. Also states he has been jittery which has occurred within the last few weeks. Having more pain and achy feeling in legs.

## 2020-05-02 NOTE — Assessment & Plan Note (Addendum)
#   Follicular lymphoma grade 3- Recurrent/relapased-s/p single agent rituximab x4 treatments-PET scan 03/06/2020-mixed response-improvement  lymph nodes within the upper retroesophageal, left retrocrural, left retroperitoneal, and right retroperitoneal region; stable right parotid left mediastinal left posterior mediastinal lymph nodes; new FDG avid aortocaval lymph node. On Rituxan- Revlimid [10 mg renally dosed; 3 weeks on 1 week off.   # pt currently # cycle 2;day-14 of revlimid. Labs today reviewed;  acceptable for treatment today- will  increase with cycle #3; discussed with pt/wife- in agreement.   #Diabetes/brittle- -poorly controlled especially on steroids; BG-123. STABLE.    # weight loss/fatigue-secondary to ongoing treatments recommend increasing protein intake.   # CKD stage III-GFR 35; diabetes- [Dr.kolluru]. STABLE  # Discussed re: asprin 81 mg/day prophylaxis for DVT/revlimid.   # DISPOSITION:  #follow up in 2 weeks- MD; labs- cbc/cmp; RituxAN--  Dr.B

## 2020-05-15 ENCOUNTER — Other Ambulatory Visit: Payer: Self-pay

## 2020-05-15 DIAGNOSIS — C8223 Follicular lymphoma grade III, unspecified, intra-abdominal lymph nodes: Secondary | ICD-10-CM

## 2020-05-16 ENCOUNTER — Inpatient Hospital Stay (HOSPITAL_BASED_OUTPATIENT_CLINIC_OR_DEPARTMENT_OTHER): Payer: Medicare PPO | Admitting: Internal Medicine

## 2020-05-16 ENCOUNTER — Other Ambulatory Visit: Payer: Self-pay

## 2020-05-16 ENCOUNTER — Encounter: Payer: Self-pay | Admitting: Internal Medicine

## 2020-05-16 ENCOUNTER — Inpatient Hospital Stay: Payer: Medicare PPO | Attending: Internal Medicine

## 2020-05-16 ENCOUNTER — Inpatient Hospital Stay: Payer: Medicare PPO

## 2020-05-16 VITALS — BP 114/59 | HR 61 | Temp 97.3°F | Resp 16 | Ht 67.0 in | Wt 179.0 lb

## 2020-05-16 DIAGNOSIS — I129 Hypertensive chronic kidney disease with stage 1 through stage 4 chronic kidney disease, or unspecified chronic kidney disease: Secondary | ICD-10-CM | POA: Diagnosis not present

## 2020-05-16 DIAGNOSIS — Z79899 Other long term (current) drug therapy: Secondary | ICD-10-CM | POA: Insufficient documentation

## 2020-05-16 DIAGNOSIS — Z923 Personal history of irradiation: Secondary | ICD-10-CM | POA: Diagnosis not present

## 2020-05-16 DIAGNOSIS — N183 Chronic kidney disease, stage 3 unspecified: Secondary | ICD-10-CM | POA: Insufficient documentation

## 2020-05-16 DIAGNOSIS — Z7982 Long term (current) use of aspirin: Secondary | ICD-10-CM | POA: Insufficient documentation

## 2020-05-16 DIAGNOSIS — R5381 Other malaise: Secondary | ICD-10-CM | POA: Diagnosis not present

## 2020-05-16 DIAGNOSIS — Z5112 Encounter for antineoplastic immunotherapy: Secondary | ICD-10-CM | POA: Insufficient documentation

## 2020-05-16 DIAGNOSIS — C8223 Follicular lymphoma grade III, unspecified, intra-abdominal lymph nodes: Secondary | ICD-10-CM | POA: Insufficient documentation

## 2020-05-16 DIAGNOSIS — Z9221 Personal history of antineoplastic chemotherapy: Secondary | ICD-10-CM | POA: Diagnosis not present

## 2020-05-16 DIAGNOSIS — R634 Abnormal weight loss: Secondary | ICD-10-CM | POA: Insufficient documentation

## 2020-05-16 DIAGNOSIS — Z794 Long term (current) use of insulin: Secondary | ICD-10-CM | POA: Insufficient documentation

## 2020-05-16 DIAGNOSIS — E1122 Type 2 diabetes mellitus with diabetic chronic kidney disease: Secondary | ICD-10-CM | POA: Diagnosis not present

## 2020-05-16 DIAGNOSIS — Z7189 Other specified counseling: Secondary | ICD-10-CM

## 2020-05-16 DIAGNOSIS — Z87891 Personal history of nicotine dependence: Secondary | ICD-10-CM | POA: Diagnosis not present

## 2020-05-16 DIAGNOSIS — R5383 Other fatigue: Secondary | ICD-10-CM | POA: Insufficient documentation

## 2020-05-16 LAB — CBC WITH DIFFERENTIAL/PLATELET
Abs Immature Granulocytes: 0.04 10*3/uL (ref 0.00–0.07)
Basophils Absolute: 0.2 10*3/uL — ABNORMAL HIGH (ref 0.0–0.1)
Basophils Relative: 4 %
Eosinophils Absolute: 0.2 10*3/uL (ref 0.0–0.5)
Eosinophils Relative: 4 %
HCT: 34.6 % — ABNORMAL LOW (ref 39.0–52.0)
Hemoglobin: 11.7 g/dL — ABNORMAL LOW (ref 13.0–17.0)
Immature Granulocytes: 1 %
Lymphocytes Relative: 22 %
Lymphs Abs: 1.2 10*3/uL (ref 0.7–4.0)
MCH: 32 pg (ref 26.0–34.0)
MCHC: 33.8 g/dL (ref 30.0–36.0)
MCV: 94.5 fL (ref 80.0–100.0)
Monocytes Absolute: 0.9 10*3/uL (ref 0.1–1.0)
Monocytes Relative: 16 %
Neutro Abs: 2.8 10*3/uL (ref 1.7–7.7)
Neutrophils Relative %: 53 %
Platelets: 238 10*3/uL (ref 150–400)
RBC: 3.66 MIL/uL — ABNORMAL LOW (ref 4.22–5.81)
RDW: 13.6 % (ref 11.5–15.5)
WBC: 5.4 10*3/uL (ref 4.0–10.5)
nRBC: 0 % (ref 0.0–0.2)

## 2020-05-16 LAB — COMPREHENSIVE METABOLIC PANEL
ALT: 29 U/L (ref 0–44)
AST: 19 U/L (ref 15–41)
Albumin: 3.2 g/dL — ABNORMAL LOW (ref 3.5–5.0)
Alkaline Phosphatase: 66 U/L (ref 38–126)
Anion gap: 8 (ref 5–15)
BUN: 40 mg/dL — ABNORMAL HIGH (ref 8–23)
CO2: 24 mmol/L (ref 22–32)
Calcium: 8.9 mg/dL (ref 8.9–10.3)
Chloride: 107 mmol/L (ref 98–111)
Creatinine, Ser: 1.83 mg/dL — ABNORMAL HIGH (ref 0.61–1.24)
GFR, Estimated: 39 mL/min — ABNORMAL LOW (ref >60)
Glucose, Bld: 51 mg/dL — ABNORMAL LOW (ref 70–99)
Potassium: 4.3 mmol/L (ref 3.5–5.1)
Sodium: 139 mmol/L (ref 135–145)
Total Bilirubin: 0.5 mg/dL (ref 0.3–1.2)
Total Protein: 6.4 g/dL — ABNORMAL LOW (ref 6.5–8.1)

## 2020-05-16 MED ORDER — SODIUM CHLORIDE 0.9 % IV SOLN
Freq: Once | INTRAVENOUS | Status: AC
  Filled 2020-05-16: qty 250

## 2020-05-16 MED ORDER — SODIUM CHLORIDE 0.9 % IV SOLN
375.0000 mg/m2 | Freq: Once | INTRAVENOUS | Status: AC
  Administered 2020-05-16: 800 mg via INTRAVENOUS
  Filled 2020-05-16: qty 50

## 2020-05-16 MED ORDER — DIPHENHYDRAMINE HCL 25 MG PO CAPS
50.0000 mg | ORAL_CAPSULE | Freq: Once | ORAL | Status: AC
  Administered 2020-05-16: 50 mg via ORAL
  Filled 2020-05-16: qty 2

## 2020-05-16 MED ORDER — ACETAMINOPHEN 325 MG PO TABS
650.0000 mg | ORAL_TABLET | Freq: Once | ORAL | Status: AC
  Administered 2020-05-16: 650 mg via ORAL
  Filled 2020-05-16: qty 2

## 2020-05-16 NOTE — Assessment & Plan Note (Addendum)
#   Follicular lymphoma grade 3- Recurrent/relapased-s/p single agent rituximab x4 treatments-PET scan 03/06/2020-mixed response-improvement  lymph nodes within the upper retroesophageal, left retrocrural, left retroperitoneal, and right retroperitoneal region; stable right parotid left mediastinal left posterior mediastinal lymph nodes; new FDG avid aortocaval lymph node. On Rituxan- Revlimid [10 mg renally dosed; 3 weeks on 1 week off.   # pt currently # cycle 3;day-1-Rituxan- revlimid. Labs today reviewed;  acceptable for treatment today. Will order PET at next visit.  Tolerating well will increase the dose of Revlimid to 15 mg 3 weeks on 1 week off.  #Diabetes/brittle- -poorly controlled especially on steroids; BG- 52; LOW- .  STABLE.   # weight loss/fatigue-secondary to ongoing treatments recommend increasing protein intake.   # CKD stage III-GFR 35; diabetes- [Dr.kolluru]. STABLE.   # Discussed re: asprin 81 mg/day prophylaxis for DVT/revlimid.   # DISPOSITION:  # Ritxan today # follow up in 2 weeks- MD; labs- cbc/cmp;-  # follow up on dec 1st  MD; labs- cbc/cmp;Rituxan- PET scan- NOV 29th or 30th AM appt; Dr.B
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
## 2020-05-16 NOTE — Progress Notes (Signed)
Pt presents today with extreme weakness and fatigue about the same as it was last time he was seen. States his legs are feeling weak and he feels "funny".  Checked his sugar and it was 59 here in the office. I got him 2 apple juices to see if that will help.

## 2020-05-20 ENCOUNTER — Other Ambulatory Visit: Payer: Self-pay | Admitting: Internal Medicine

## 2020-05-20 DIAGNOSIS — C8223 Follicular lymphoma grade III, unspecified, intra-abdominal lymph nodes: Secondary | ICD-10-CM

## 2020-05-23 ENCOUNTER — Telehealth: Payer: Self-pay | Admitting: *Deleted

## 2020-05-23 NOTE — Telephone Encounter (Signed)
Dr. Rogue Bussing - next revlimid RF can not be submitted until 06/01/2020 (this is when the celegene auth expires).

## 2020-05-23 NOTE — Telephone Encounter (Signed)
Call from pharmacy that Revlimid prescription sent in with no [employer] auth #. Please resubmit with auth #

## 2020-05-29 ENCOUNTER — Telehealth: Payer: Self-pay | Admitting: *Deleted

## 2020-05-29 DIAGNOSIS — C8223 Follicular lymphoma grade III, unspecified, intra-abdominal lymph nodes: Secondary | ICD-10-CM

## 2020-05-29 MED ORDER — LENALIDOMIDE 15 MG PO CAPS: ORAL_CAPSULE | ORAL | 0 refills | Status: DC

## 2020-05-29 NOTE — Telephone Encounter (Signed)
RF for revlimid prescription sent to [facility] completed. [id number]

## 2020-05-29 NOTE — Telephone Encounter (Signed)
RF for revlimid prescription sent to [facility] completed. [id number]

## 2020-05-30 ENCOUNTER — Encounter: Payer: Self-pay | Admitting: Internal Medicine

## 2020-05-30 ENCOUNTER — Inpatient Hospital Stay (HOSPITAL_BASED_OUTPATIENT_CLINIC_OR_DEPARTMENT_OTHER): Payer: Medicare PPO | Admitting: Internal Medicine

## 2020-05-30 ENCOUNTER — Inpatient Hospital Stay: Payer: Medicare PPO

## 2020-05-30 DIAGNOSIS — C8223 Follicular lymphoma grade III, unspecified, intra-abdominal lymph nodes: Secondary | ICD-10-CM | POA: Diagnosis not present

## 2020-05-30 DIAGNOSIS — Z5112 Encounter for antineoplastic immunotherapy: Secondary | ICD-10-CM | POA: Diagnosis not present

## 2020-05-30 LAB — COMPREHENSIVE METABOLIC PANEL
ALT: 30 U/L (ref 0–44)
AST: 23 U/L (ref 15–41)
Albumin: 3 g/dL — ABNORMAL LOW (ref 3.5–5.0)
Alkaline Phosphatase: 69 U/L (ref 38–126)
Anion gap: 8 (ref 5–15)
BUN: 20 mg/dL (ref 8–23)
CO2: 25 mmol/L (ref 22–32)
Calcium: 8.5 mg/dL — ABNORMAL LOW (ref 8.9–10.3)
Chloride: 106 mmol/L (ref 98–111)
Creatinine, Ser: 1.87 mg/dL — ABNORMAL HIGH (ref 0.61–1.24)
GFR, Estimated: 38 mL/min — ABNORMAL LOW (ref >60)
Glucose, Bld: 104 mg/dL — ABNORMAL HIGH (ref 70–99)
Potassium: 3.6 mmol/L (ref 3.5–5.1)
Sodium: 139 mmol/L (ref 135–145)
Total Bilirubin: 0.5 mg/dL (ref 0.3–1.2)
Total Protein: 6 g/dL — ABNORMAL LOW (ref 6.5–8.1)

## 2020-05-30 LAB — CBC WITH DIFFERENTIAL/PLATELET
Abs Immature Granulocytes: 0.11 10*3/uL — ABNORMAL HIGH (ref 0.00–0.07)
Basophils Absolute: 0.1 10*3/uL (ref 0.0–0.1)
Basophils Relative: 1 %
Eosinophils Absolute: 0.4 10*3/uL (ref 0.0–0.5)
Eosinophils Relative: 6 %
HCT: 32.2 % — ABNORMAL LOW (ref 39.0–52.0)
Hemoglobin: 11 g/dL — ABNORMAL LOW (ref 13.0–17.0)
Immature Granulocytes: 2 %
Lymphocytes Relative: 12 %
Lymphs Abs: 0.8 10*3/uL (ref 0.7–4.0)
MCH: 32.4 pg (ref 26.0–34.0)
MCHC: 34.2 g/dL (ref 30.0–36.0)
MCV: 94.7 fL (ref 80.0–100.0)
Monocytes Absolute: 0.9 10*3/uL (ref 0.1–1.0)
Monocytes Relative: 13 %
Neutro Abs: 4.5 10*3/uL (ref 1.7–7.7)
Neutrophils Relative %: 66 %
Platelets: 161 10*3/uL (ref 150–400)
RBC: 3.4 MIL/uL — ABNORMAL LOW (ref 4.22–5.81)
RDW: 15.2 % (ref 11.5–15.5)
WBC: 6.8 10*3/uL (ref 4.0–10.5)
nRBC: 0 % (ref 0.0–0.2)
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
## 2020-05-30 NOTE — Assessment & Plan Note (Addendum)
#   Follicular lymphoma grade 3- Recurrent/relapased-s/p single agent rituximab x4 treatments-PET scan 03/06/2020-mixed response-improvement  lymph nodes within the upper retroesophageal, left retrocrural, left retroperitoneal, and right retroperitoneal region; stable right parotid left mediastinal left posterior mediastinal lymph nodes; new FDG avid aortocaval lymph node. On Rituxan- Revlimid [10 mg renally dosed; 3 weeks on 1 week off. # 3 cycle- increased to 15 mg.   # pt currently # cycle 3;day-15-Rituxan- revlimid. Labs today reviewed;  acceptable for treatment today. Will order PET today.   # URI- self limited; resolved.   #Diabetes/brittle- -poorly controlled especially on steroids; BG- 104 .  STABLE.   # weight loss/fatigue-secondary to ongoing treatments recommend increasing protein intake; STABLE.    # CKD stage III-GFR 38; diabetes- [Dr.kolluru]. STABLE.   # Discussed re: asprin 81 mg/day prophylaxis for DVT/revlimid.   # DISPOSITION:  # follow up as planned- on dec 1st  MD; labs- cbc/cmp;Rituxan- PET scan- 30th AM appt; Dr.B

## 2020-06-12 ENCOUNTER — Encounter
Admission: RE | Admit: 2020-06-12 | Discharge: 2020-06-12 | Disposition: A | Discharge status: Home / Self Care | Payer: Medicare PPO | Source: Ambulatory Visit | Attending: Internal Medicine | Admitting: Internal Medicine

## 2020-06-12 ENCOUNTER — Other Ambulatory Visit: Payer: Self-pay

## 2020-06-12 DIAGNOSIS — I700 Atherosclerosis of aorta: Secondary | ICD-10-CM | POA: Diagnosis not present

## 2020-06-12 DIAGNOSIS — C8223 Follicular lymphoma grade III, unspecified, intra-abdominal lymph nodes: Secondary | ICD-10-CM | POA: Insufficient documentation

## 2020-06-12 DIAGNOSIS — I2510 Atherosclerotic heart disease of native coronary artery without angina pectoris: Secondary | ICD-10-CM | POA: Insufficient documentation

## 2020-06-12 LAB — GLUCOSE, CAPILLARY
Glucose-Capillary: 59 mg/dL — ABNORMAL LOW (ref 70–99)
Glucose-Capillary: 62 mg/dL — ABNORMAL LOW (ref 70–99)

## 2020-06-12 MED ORDER — FLUDEOXYGLUCOSE F - 18 (FDG) INJECTION
9.2000 | Freq: Once | INTRAVENOUS | Status: AC | PRN
  Administered 2020-06-12: 9.62 via INTRAVENOUS

## 2020-06-13 ENCOUNTER — Inpatient Hospital Stay: Payer: Medicare PPO | Attending: Internal Medicine

## 2020-06-13 ENCOUNTER — Inpatient Hospital Stay (HOSPITAL_BASED_OUTPATIENT_CLINIC_OR_DEPARTMENT_OTHER): Payer: Medicare PPO | Admitting: Internal Medicine

## 2020-06-13 ENCOUNTER — Encounter: Payer: Self-pay | Admitting: Internal Medicine

## 2020-06-13 ENCOUNTER — Inpatient Hospital Stay: Payer: Medicare PPO

## 2020-06-13 ENCOUNTER — Other Ambulatory Visit: Payer: Self-pay

## 2020-06-13 VITALS — BP 139/75 | HR 64 | Temp 97.1°F | Resp 16

## 2020-06-13 DIAGNOSIS — C8223 Follicular lymphoma grade III, unspecified, intra-abdominal lymph nodes: Secondary | ICD-10-CM | POA: Insufficient documentation

## 2020-06-13 DIAGNOSIS — D72819 Decreased white blood cell count, unspecified: Secondary | ICD-10-CM | POA: Diagnosis not present

## 2020-06-13 DIAGNOSIS — Z87891 Personal history of nicotine dependence: Secondary | ICD-10-CM | POA: Diagnosis not present

## 2020-06-13 DIAGNOSIS — I10 Essential (primary) hypertension: Secondary | ICD-10-CM | POA: Diagnosis not present

## 2020-06-13 DIAGNOSIS — Z79899 Other long term (current) drug therapy: Secondary | ICD-10-CM | POA: Diagnosis not present

## 2020-06-13 DIAGNOSIS — Z5112 Encounter for antineoplastic immunotherapy: Secondary | ICD-10-CM | POA: Diagnosis not present

## 2020-06-13 DIAGNOSIS — Z7189 Other specified counseling: Secondary | ICD-10-CM

## 2020-06-13 DIAGNOSIS — E119 Type 2 diabetes mellitus without complications: Secondary | ICD-10-CM | POA: Insufficient documentation

## 2020-06-13 DIAGNOSIS — D649 Anemia, unspecified: Secondary | ICD-10-CM | POA: Diagnosis not present

## 2020-06-13 DIAGNOSIS — E069 Thyroiditis, unspecified: Secondary | ICD-10-CM | POA: Diagnosis not present

## 2020-06-13 DIAGNOSIS — H547 Unspecified visual loss: Secondary | ICD-10-CM | POA: Diagnosis not present

## 2020-06-13 DIAGNOSIS — N183 Chronic kidney disease, stage 3 unspecified: Secondary | ICD-10-CM | POA: Diagnosis not present

## 2020-06-13 DIAGNOSIS — Z9221 Personal history of antineoplastic chemotherapy: Secondary | ICD-10-CM | POA: Diagnosis not present

## 2020-06-13 DIAGNOSIS — Z794 Long term (current) use of insulin: Secondary | ICD-10-CM | POA: Insufficient documentation

## 2020-06-13 DIAGNOSIS — Z7982 Long term (current) use of aspirin: Secondary | ICD-10-CM | POA: Diagnosis not present

## 2020-06-13 LAB — CBC WITH DIFFERENTIAL/PLATELET
Abs Immature Granulocytes: 0.05 10*3/uL (ref 0.00–0.07)
Basophils Absolute: 0.2 10*3/uL — ABNORMAL HIGH (ref 0.0–0.1)
Basophils Relative: 3 %
Eosinophils Absolute: 0.2 10*3/uL (ref 0.0–0.5)
Eosinophils Relative: 4 %
HCT: 33.6 % — ABNORMAL LOW (ref 39.0–52.0)
Hemoglobin: 11.3 g/dL — ABNORMAL LOW (ref 13.0–17.0)
Immature Granulocytes: 1 %
Lymphocytes Relative: 16 %
Lymphs Abs: 0.9 10*3/uL (ref 0.7–4.0)
MCH: 32.3 pg (ref 26.0–34.0)
MCHC: 33.6 g/dL (ref 30.0–36.0)
MCV: 96 fL (ref 80.0–100.0)
Monocytes Absolute: 0.7 10*3/uL (ref 0.1–1.0)
Monocytes Relative: 13 %
Neutro Abs: 3.5 10*3/uL (ref 1.7–7.7)
Neutrophils Relative %: 63 %
Platelets: 177 10*3/uL (ref 150–400)
RBC: 3.5 MIL/uL — ABNORMAL LOW (ref 4.22–5.81)
RDW: 17.2 % — ABNORMAL HIGH (ref 11.5–15.5)
WBC: 5.5 10*3/uL (ref 4.0–10.5)
nRBC: 0 % (ref 0.0–0.2)

## 2020-06-13 LAB — COMPREHENSIVE METABOLIC PANEL
ALT: 17 U/L (ref 0–44)
AST: 18 U/L (ref 15–41)
Albumin: 3.1 g/dL — ABNORMAL LOW (ref 3.5–5.0)
Alkaline Phosphatase: 57 U/L (ref 38–126)
Anion gap: 10 (ref 5–15)
BUN: 25 mg/dL — ABNORMAL HIGH (ref 8–23)
CO2: 25 mmol/L (ref 22–32)
Calcium: 8.5 mg/dL — ABNORMAL LOW (ref 8.9–10.3)
Chloride: 102 mmol/L (ref 98–111)
Creatinine, Ser: 1.79 mg/dL — ABNORMAL HIGH (ref 0.61–1.24)
GFR, Estimated: 40 mL/min — ABNORMAL LOW (ref >60)
Glucose, Bld: 107 mg/dL — ABNORMAL HIGH (ref 70–99)
Potassium: 4 mmol/L (ref 3.5–5.1)
Sodium: 137 mmol/L (ref 135–145)
Total Bilirubin: 0.5 mg/dL (ref 0.3–1.2)
Total Protein: 6.1 g/dL — ABNORMAL LOW (ref 6.5–8.1)

## 2020-06-13 LAB — TSH: TSH: 33.782 u[IU]/mL — ABNORMAL HIGH (ref 0.350–4.500)

## 2020-06-13 MED ORDER — ACETAMINOPHEN 325 MG PO TABS
650.0000 mg | ORAL_TABLET | Freq: Once | ORAL | Status: AC
  Administered 2020-06-13: 650 mg via ORAL
  Filled 2020-06-13: qty 2

## 2020-06-13 MED ORDER — SODIUM CHLORIDE 0.9 % IV SOLN
375.0000 mg/m2 | Freq: Once | INTRAVENOUS | Status: AC
  Administered 2020-06-13: 800 mg via INTRAVENOUS
  Filled 2020-06-13: qty 50

## 2020-06-13 MED ORDER — DIPHENHYDRAMINE HCL 25 MG PO CAPS
50.0000 mg | ORAL_CAPSULE | Freq: Once | ORAL | Status: AC
  Administered 2020-06-13: 50 mg via ORAL
  Filled 2020-06-13: qty 2

## 2020-06-13 MED ORDER — SODIUM CHLORIDE 0.9 % IV SOLN
Freq: Once | INTRAVENOUS | Status: AC
  Filled 2020-06-13: qty 250

## 2020-06-13 NOTE — Assessment & Plan Note (Addendum)
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  Will monitor for now given patient's limited options.  Diffuse thyroid uptake-see below  #proceed with cycle #4- Rituxan- Revlimid  # proceed with  # cycle 4;day-1-Rituxan- revlimid [starting. # 3 cycle- increased to 15 mg]. rreviewed;  acceptable for treatment today.  #Diabetes/brittle- -poorly controlled especially on steroids; BG- 104 .  STABLE.   # thyroiditis- incidental of PET scan; will check TSH today.    # CKD stage III-GFR 40; diabetes- [Dr.kolluru]. STABLE.   # Discussed re: asprin 81 mg/day prophylaxis for DVT/revlimid.   # DISPOSITION: ORDER TSH.  # Rituxan today. # follow up in 2 weeks- MD; labs- cbc/cmp; Thyroid profile- Dr.B  # I reviewed the blood work- with the patient in detail; also reviewed the imaging independently [as summarized above]; and with the patient in detail.
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
## 2020-06-17 ENCOUNTER — Other Ambulatory Visit: Payer: Self-pay | Admitting: Internal Medicine

## 2020-06-17 DIAGNOSIS — C8223 Follicular lymphoma grade III, unspecified, intra-abdominal lymph nodes: Secondary | ICD-10-CM

## 2020-06-19 ENCOUNTER — Telehealth: Payer: Self-pay | Admitting: Internal Medicine

## 2020-06-19 MED ORDER — LEVOTHYROXINE SODIUM 100 MCG PO TABS: 100.0000 ug | ORAL_TABLET | Freq: Every day | ORAL | 1 refills | Status: DC

## 2020-06-19 NOTE — Telephone Encounter (Signed)
On 12.06-spoke to patient regarding results of the elevated thyroid function.  Recommend starting patient on Synthroid 100 mcg once a day.  Recommend taking Synthroid empty stomach in the morning.  We will repeat thyroid profile at next visit.

## 2020-06-19 NOTE — Telephone Encounter (Signed)
Refill not due until 06/28/20

## 2020-06-25 ENCOUNTER — Other Ambulatory Visit: Payer: Self-pay | Admitting: *Deleted

## 2020-06-25 DIAGNOSIS — C8223 Follicular lymphoma grade III, unspecified, intra-abdominal lymph nodes: Secondary | ICD-10-CM

## 2020-06-25 MED ORDER — LENALIDOMIDE 15 MG PO CAPS: ORAL_CAPSULE | ORAL | 0 refills | Status: DC

## 2020-06-28 ENCOUNTER — Encounter: Payer: Self-pay | Admitting: Internal Medicine

## 2020-06-28 ENCOUNTER — Inpatient Hospital Stay (HOSPITAL_BASED_OUTPATIENT_CLINIC_OR_DEPARTMENT_OTHER): Payer: Medicare PPO | Admitting: Internal Medicine

## 2020-06-28 ENCOUNTER — Inpatient Hospital Stay: Payer: Medicare PPO

## 2020-06-28 DIAGNOSIS — Z5112 Encounter for antineoplastic immunotherapy: Secondary | ICD-10-CM | POA: Diagnosis not present

## 2020-06-28 DIAGNOSIS — C8223 Follicular lymphoma grade III, unspecified, intra-abdominal lymph nodes: Secondary | ICD-10-CM | POA: Diagnosis not present

## 2020-06-28 LAB — CBC WITH DIFFERENTIAL/PLATELET
Abs Immature Granulocytes: 0.04 10*3/uL (ref 0.00–0.07)
Basophils Absolute: 0.1 10*3/uL (ref 0.0–0.1)
Basophils Relative: 2 %
Eosinophils Absolute: 0.6 10*3/uL — ABNORMAL HIGH (ref 0.0–0.5)
Eosinophils Relative: 10 %
HCT: 35.1 % — ABNORMAL LOW (ref 39.0–52.0)
Hemoglobin: 12 g/dL — ABNORMAL LOW (ref 13.0–17.0)
Immature Granulocytes: 1 %
Lymphocytes Relative: 16 %
Lymphs Abs: 0.9 10*3/uL (ref 0.7–4.0)
MCH: 33.1 pg (ref 26.0–34.0)
MCHC: 34.2 g/dL (ref 30.0–36.0)
MCV: 96.7 fL (ref 80.0–100.0)
Monocytes Absolute: 0.4 10*3/uL (ref 0.1–1.0)
Monocytes Relative: 8 %
Neutro Abs: 3.7 10*3/uL (ref 1.7–7.7)
Neutrophils Relative %: 63 %
Platelets: 103 10*3/uL — ABNORMAL LOW (ref 150–400)
RBC: 3.63 MIL/uL — ABNORMAL LOW (ref 4.22–5.81)
RDW: 18.2 % — ABNORMAL HIGH (ref 11.5–15.5)
WBC: 5.8 10*3/uL (ref 4.0–10.5)
nRBC: 0 % (ref 0.0–0.2)

## 2020-06-28 LAB — COMPREHENSIVE METABOLIC PANEL
ALT: 28 U/L (ref 0–44)
AST: 23 U/L (ref 15–41)
Albumin: 3.6 g/dL (ref 3.5–5.0)
Alkaline Phosphatase: 60 U/L (ref 38–126)
Anion gap: 10 (ref 5–15)
BUN: 23 mg/dL (ref 8–23)
CO2: 26 mmol/L (ref 22–32)
Calcium: 8.7 mg/dL — ABNORMAL LOW (ref 8.9–10.3)
Chloride: 101 mmol/L (ref 98–111)
Creatinine, Ser: 1.75 mg/dL — ABNORMAL HIGH (ref 0.61–1.24)
GFR, Estimated: 41 mL/min — ABNORMAL LOW (ref >60)
Glucose, Bld: 141 mg/dL — ABNORMAL HIGH (ref 70–99)
Potassium: 3.5 mmol/L (ref 3.5–5.1)
Sodium: 137 mmol/L (ref 135–145)
Total Bilirubin: 0.6 mg/dL (ref 0.3–1.2)
Total Protein: 6.7 g/dL (ref 6.5–8.1)

## 2020-06-28 NOTE — Assessment & Plan Note (Addendum)
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  Will monitor for now given patient's limited options.  Diffuse thyroid uptake-see below-STABLE.   #Currently s/p cycle 4;day-15 today-Rituxan-revlimid; platelets 103.  Monitor closely.  #Diabetes/brittle- -poorly controlled;  BG- 163 .  STABLE.   # thyroiditis- incidental of PET scan; TSH- 33; started on Synthoid 100 mcg/day [12/02]; today thyroid profile shows-TSH 43; T4 low we will plan increasing Synthroid to 175 mcg.  New prescription called.  Recommend follow-up with Dr. Gabriel Carina.  # CKD stage III-GFR 40; diabetes- [Dr.kolluru]. STABLE>   # Discussed re: asprin 81 mg/day prophylaxis for DVT/revlimid.   # DISPOSITION:  # follow up in 2 weeks- X-MD; labs- cbc/cmp; Rituxan SQ [wed];   # follow up in 4 weeks- MD; labs- cbc/cmp; thyroid profile-Dr.B  Cc; Dr.Solum
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
## 2020-06-29 LAB — THYROID PANEL WITH TSH
Free Thyroxine Index: 0.8 — ABNORMAL LOW (ref 1.2–4.9)
T3 Uptake Ratio: 20 % — ABNORMAL LOW (ref 24–39)
T4, Total: 4.2 ug/dL — ABNORMAL LOW (ref 4.5–12.0)
TSH: 43.9 u[IU]/mL — ABNORMAL HIGH (ref 0.450–4.500)

## 2020-07-01 MED ORDER — LEVOTHYROXINE SODIUM 75 MCG PO TABS: ORAL_TABLET | ORAL | 3 refills | Status: DC

## 2020-07-02 ENCOUNTER — Telehealth: Payer: Self-pay | Admitting: *Deleted

## 2020-07-02 NOTE — Telephone Encounter (Signed)
Spoke with Dr. Rogue Bussing. TSH is still abnormal. MD sent an additional prescription Synthroid 75 mcg dosing to be added to Synthroid 100 mcg dosing = 175 mcg. Spoke with patient's wife. She gave verbal understanding of the plan of care.

## 2020-07-08 NOTE — Progress Notes (Signed)
[facility]  [phone] [phone]  ID: [name] OB: 03/09/1951  MR#: [id number]  GUY#:[id number]  Patient Care Team: Baxter Hire, MD as PCP - General (Internal Medicine) Lavonia Dana, MD as Consulting Physician (Internal Medicine) Cammie Sickle, MD as Consulting Physician (Hematology and Oncology)  CHIEF COMPLAINT: Follicular lymphoma, grade 3.  INTERVAL HISTORY: Patient returns to clinic today for further evaluation and continuation of monthly Rituxan.  He is also taking Revlimid for 21 days with 7 days off and is tolerating both treatments well without significant side effects.  He has chronic weakness and fatigue.  He has no neurologic complaints.  He denies any recent fevers or illnesses.  He has a fair appetite, but denies weight loss.  He has no chest pain, shortness of breath, cough, or hemoptysis.  He denies any nausea, vomiting, constipation, or diarrhea.  He has no urinary complaints.  Patient offers no further specific complaints today.  REVIEW OF SYSTEMS:   Review of Systems  Constitutional: Positive for malaise/fatigue. Negative for fever and weight loss.  Respiratory: Negative.  Negative for cough, hemoptysis and shortness of breath.   Cardiovascular: Negative.  Negative for chest pain and leg swelling.  Gastrointestinal: Negative.  Negative for abdominal pain.  Genitourinary: Negative.  Negative for dysuria.  Musculoskeletal: Negative.  Negative for back pain.  Skin: Negative.   Neurological: Positive for weakness. Negative for dizziness, focal weakness and headaches.  Psychiatric/Behavioral: Negative.  The patient is not nervous/anxious.     As per HPI. Otherwise, a complete review of systems is negative.  PAST MEDICAL HISTORY: Past Medical History:  Diagnosis Date  . Cancer ([place])    non-hodgkins lymphoma  . Chronic kidney disease   . Diabetes mellitus without complication ([place])   . Hypertension      PAST SURGICAL HISTORY: Past Surgical History:  Procedure Laterality Date  . COLONOSCOPY    . COLONOSCOPY WITH PROPOFOL N/A 05/10/2015   Procedure: COLONOSCOPY WITH PROPOFOL;  Surgeon: Hulen Luster, MD;  Location: [facility] ENDOSCOPY;  Service: Gastroenterology;  Laterality: N/A;  . EYE SURGERY    . NOSE SURGERY      FAMILY HISTORY: No family history on file.  ADVANCED DIRECTIVES (Y/N):  N  HEALTH MAINTENANCE: Social History   Tobacco Use  . Smoking status: Former Research scientist (life sciences)  . Smokeless tobacco: Never Used     Colonoscopy:  PAP:  Bone density:  Lipid panel:  Allergies  Allergen Reactions  . Sulfa Antibiotics Other (See Comments)    Patient states frequent and persistent urination.    Current Outpatient Medications  Medication Sig Dispense Refill  . aspirin EC 81 MG tablet Take 81 mg by mouth daily.     Marland Kitchen azithromycin (ZITHROMAX) 250 MG tablet Take 250 mg by mouth daily.    . clobetasol cream (TEMOVATE) 5.63 % Apply 1 application topically as needed (itching).     Marland Kitchen doxepin (SINEQUAN) 50 MG capsule Take 50 mg by mouth at bedtime.     . enalapril (VASOTEC) 20 MG tablet TAKE ONE TABLET BY MOUTH EVERY DAY    . Ergocalciferol (VITAMIN D2) 2000 units TABS Take 1 tablet by mouth daily.    . insulin aspart (NOVOLOG) 100 UNIT/ML FlexPen Inject into the skin. 14 units at breakfast and 20 units at dinner    . ketoconazole (NIZORAL) 2 % shampoo Apply 1 application topically as needed for irritation.    Marland Kitchen lenalidomide (REVLIMID) 15 MG capsule TAKE 1 CAPSULE BY  MOUTH EVERY DAY FOR 21 DAYS THEN OFF FOR 7 DAYS 21 capsule 0  . levothyroxine (SYNTHROID) 100 MCG tablet Take 1 tablet (100 mcg total) by mouth daily before breakfast. 30 tablet 1  . levothyroxine (SYNTHROID) 75 MCG tablet Take 1 tablet with 100 mcg tablet [ a total of 175 mcg a day] in AM. On empty stomach 30 tablet 3  . metoprolol (LOPRESSOR) 100 MG tablet Take 100 mg by mouth daily.     . Multiple Vitamin (MULTIVITAMIN WITH  MINERALS) TABS tablet Take 1 tablet by mouth daily.    Marland Kitchen NIFEdipine (PROCARDIA XL/ADALAT-CC) 60 MG 24 hr tablet Take 60 mg by mouth daily.     Marland Kitchen omeprazole (PRILOSEC) 20 MG capsule Take 20 mg by mouth daily.     . prednisoLONE sodium phosphate (INFLAMASE FORTE) 1 % ophthalmic solution Place 1 % into both eyes daily as needed (eye irritation).     . sitaGLIPtin (JANUVIA) 50 MG tablet TAKE ONE TABLET BY MOUTH EVERY DAY    . TRESIBA FLEXTOUCH 200 UNIT/ML SOPN Inject 58 Units into the skin at bedtime.   3   No current facility-administered medications for this visit.   Facility-Administered Medications Ordered in Other Visits  Medication Dose Route Frequency Provider Last Rate Last Admin  . heparin lock flush 100 unit/mL  500 Units Intracatheter Once PRN Lloyd Huger, MD      . sodium chloride flush (NS) 0.9 % injection 10 mL  10 mL Intracatheter PRN Lloyd Huger, MD        OBJECTIVE: Vitals:   07/11/20 0913  BP: (!) 149/79  Pulse: 72  Resp: 18  Temp: 99.6 F (37.6 C)  SpO2: 98%     Body mass index is 28.67 kg/m.    ECOG FS:0 - Asymptomatic  General: Well-developed, well-nourished, no acute distress. Eyes: Pink conjunctiva, anicteric sclera. HEENT: Normocephalic, moist mucous membranes. Lungs: No audible wheezing or coughing. Heart: Regular rate and rhythm. Abdomen: Soft, nontender, no obvious distention. Musculoskeletal: No edema, cyanosis, or clubbing. Neuro: Alert, answering all questions appropriately. Cranial nerves grossly intact. Skin: No rashes or petechiae noted. Psych: Normal affect.    LAB RESULTS:  Lab Results  Component Value Date   NA 134 (L) 07/11/2020   K 3.7 07/11/2020   CL 100 07/11/2020   CO2 27 07/11/2020   GLUCOSE 132 (H) 07/11/2020   BUN 26 (H) 07/11/2020   CREATININE 1.98 (H) 07/11/2020   CALCIUM 7.7 (L) 07/11/2020   PROT 6.2 (L) 07/11/2020   ALBUMIN 3.2 (L) 07/11/2020   AST 21 07/11/2020   ALT 19 07/11/2020   ALKPHOS 46  07/11/2020   BILITOT 0.4 07/11/2020   GFRNONAA 35 (L) 07/11/2020   GFRAA 39 (L) 04/04/2020    Lab Results  Component Value Date   WBC 2.5 (L) 07/11/2020   NEUTROABS 1.5 (L) 07/11/2020   HGB 10.0 (L) 07/11/2020   HCT 29.2 (L) 07/11/2020   MCV 96.1 07/11/2020   PLT 112 (L) 07/11/2020     STUDIES: NM PET Image Restag (PS) Skull Base To Thigh  Result Date: 06/12/2020 CLINICAL DATA:  Subsequent treatment strategy for follicular lymphoma. EXAM: NUCLEAR MEDICINE PET SKULL BASE TO THIGH TECHNIQUE: 9.64 mCi F-18 FDG was injected intravenously. Full-ring PET imaging was performed from the skull base to thigh after the radiotracer. CT data was obtained and used for attenuation correction and anatomic localization. Fasting blood glucose: 59 mg/dl COMPARISON:  03/06/2020 FINDINGS: Mediastinal blood pool activity: SUV max 2.4  Liver activity: SUV max 2.99 NECK: Previous FDG avid node within the right parotid gland measures 5 mm within SUV max of 2.11, image 32/3. Previously this measured 9 mm within SUV max of 4.27. Incidental CT findings: none CHEST: There is diffuse uptake within both lobes of the thyroid gland. This is new when compared with the previous study. No underlying nodule identified. Interval resolution of index left supraclavicular lymph node. Index upper retroesophageal lymph node measures 0.7 cm with SUV max of 1.9, image 80/3. Previously 0.4 cm within SUV max of 2.42. Index left mediastinal lymph node measures 0.7 cm and has an SUV max of 2.47, image 98/3. Previously 0.9 cm within SUV max of 4.92. Interval improvement in FDG avid pericardial thickening which measures 0.5 cm in maximum thickness within SUV max of 1.91, image 119/3. Previously this measured 1 cm in thickness within SUV max of 3.05. Index FDG avid retrocrural lymph node measures 0.4 cm within SUV max of 1.19, image 134/3. Previously 0.5 cm with SUV max of 1.78. Within the posterior mediastinum retroaortic lymph node measures 0.5  cm with SUV max of 1.6, image 111/3. Previously 0.9 cm within SUV max of 11.09. Incidental CT findings: No pleural effusion identified. Lungs are clear. Aortic atherosclerosis. Coronary artery calcifications. ABDOMEN/PELVIS: Index left retroperitoneal lymph noted adjacent to the left adrenal gland has resolved in the interval. Index right abdominal retroperitoneal lymph node has also resolved. Index aortocaval lymph node measures 0.7 cm with SUV max of 2.01, image 167/3. Previously 1.2 cm within SUV max of 14.8. Index soft tissue nodule adjacent to sigmoid colon is now calcified measuring 4 mm with SUV max of 1.28, image 202/3. previously this measured 0.7 cm within SUV max of 1.23. New central mesenteric soft tissue nodule measures 1.3 x 0.9 cm within SUV max of 3.63, image 190/3. This is compatible with Deauville criteria 4/5. Incidental CT findings: Hepatic steatosis is again noted. Aortic atherosclerosis. Colonic diverticulosis. SKELETON: No focal hypermetabolic activity to suggest skeletal metastasis. Incidental CT findings: none IMPRESSION: 1. Overall, there has been an interval response to therapy. Interval decrease in size and FDG uptake associated with the index lymph nodes within the neck, chest, abdomen and pelvis. These all exhibit FDG uptake with SUV max consistent with Deauville criteria 2 and 3. 2. A single focal area of increased soft tissue within the central small bowel mesentery is new from previous exam and has an SUV max of 3.63 which is compatible with Deauville criteria 4/5. 3. Uniformly increased tracer uptake within both lobes of thyroid gland is new from previous exam. Findings may reflect thyroiditis. Recommend correlation with TSH values. Electronically Signed   By: Kerby Moors M.D.   On: 06/12/2020 14:08    ASSESSMENT: Follicular lymphoma, grade 3.  PLAN:  1. Follicular lymphoma, grade 3: Proceed with IV Rituxan today.  Plan is to switch patient to subcutaneous Rituxan with  next clinic visit.  Continue oral Revlimid for 21 days and 7 days off as prescribed.  Return to clinic in 2 weeks for laboratory work and further evaluation and then in 4 weeks for laboratory work, further evaluation and continuation of treatment. 2.  Diabetes: Patient has a history of poor glycemic control, but his blood glucose is 132 today. 3.  Renal insufficiency: Patient's creatinine is trended up slightly to 1.98.  Monitor. 4.  Leukopenia: Likely treatment related.  Patient's white blood cell count is 2.5 today.  Proceed with treatment as above. 4.  Anemia: Patient's hemoglobin is  trended down to 10.0, monitor. 5.  Thrombocytopenia: Chronic and unchanged.  Patient's platelet count is 112 today. 6.  Thyroiditis: Incidentally noted on PET scan.  Continue current dose of Synthroid.  Patient expressed understanding and was in agreement with this plan. He also understands that He can call clinic at any time with any questions, concerns, or complaints.    Lloyd Huger, MD   07/11/2020 12:48 PM

## 2020-07-10 ENCOUNTER — Telehealth: Payer: Self-pay | Admitting: *Deleted

## 2020-07-10 NOTE — Telephone Encounter (Signed)
I spoke with patient wife and informed that if no fever that patient can come in tomorrow as planned. She repeated back to me

## 2020-07-10 NOTE — Telephone Encounter (Signed)
Agree.  Need covid to be negative to treat.

## 2020-07-10 NOTE — Telephone Encounter (Signed)
Patient called reporting that he has a cold and that it got worse last night and wants prescription sent in for it. He reports that he had chills during night, but did not check temp then and that his temp this morning is 99.3. 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 He reports that his legs are aching, he is sneezing, and that his balance is off. He states that if he feels like this tomorrow that he will not come in for his infusion tomorrow. He reports that he is on oral chemotherapy as well. Please advise. He requests a responsel this morning if at all possible.

## 2020-07-10 NOTE — Telephone Encounter (Signed)
Per Dr. Grayland Ormond patient can come in tomorrow as scheduled if he does not have a fever and is covid negative.

## 2020-07-10 NOTE — Telephone Encounter (Signed)
Dr. Grayland Ormond - FYI- since he is on your schedule tomorrow.

## 2020-07-10 NOTE — Telephone Encounter (Signed)
Unlikely related to his lymphoma. Recommend he contact his PCP for evaluation and a Covid test.

## 2020-07-10 NOTE — Telephone Encounter (Signed)
Patient has called back reporting that his COVID test is negative and is asking if he should still come in for treatment tomorrow given that he has a bad cold. Please return his call to advise him what to do.

## 2020-07-10 NOTE — Telephone Encounter (Signed)
Call returned to patient and advised of NP response to call PCP and get COVID tested. He states he will do so.

## 2020-07-11 ENCOUNTER — Inpatient Hospital Stay: Payer: Medicare PPO

## 2020-07-11 ENCOUNTER — Other Ambulatory Visit: Payer: Self-pay

## 2020-07-11 ENCOUNTER — Inpatient Hospital Stay (HOSPITAL_BASED_OUTPATIENT_CLINIC_OR_DEPARTMENT_OTHER): Payer: Medicare PPO | Admitting: Oncology

## 2020-07-11 VITALS — BP 149/79 | HR 72 | Temp 99.6°F | Resp 18 | Wt 183.1 lb

## 2020-07-11 VITALS — BP 128/73 | HR 66 | Resp 16

## 2020-07-11 DIAGNOSIS — Z5112 Encounter for antineoplastic immunotherapy: Secondary | ICD-10-CM | POA: Diagnosis not present

## 2020-07-11 DIAGNOSIS — C8223 Follicular lymphoma grade III, unspecified, intra-abdominal lymph nodes: Secondary | ICD-10-CM

## 2020-07-11 DIAGNOSIS — Z7189 Other specified counseling: Secondary | ICD-10-CM

## 2020-07-11 LAB — CBC WITH DIFFERENTIAL/PLATELET
Abs Immature Granulocytes: 0.05 10*3/uL (ref 0.00–0.07)
Basophils Absolute: 0 10*3/uL (ref 0.0–0.1)
Basophils Relative: 2 %
Eosinophils Absolute: 0.1 10*3/uL (ref 0.0–0.5)
Eosinophils Relative: 5 %
HCT: 29.2 % — ABNORMAL LOW (ref 39.0–52.0)
Hemoglobin: 10 g/dL — ABNORMAL LOW (ref 13.0–17.0)
Immature Granulocytes: 2 %
Lymphocytes Relative: 16 %
Lymphs Abs: 0.4 10*3/uL — ABNORMAL LOW (ref 0.7–4.0)
MCH: 32.9 pg (ref 26.0–34.0)
MCHC: 34.2 g/dL (ref 30.0–36.0)
MCV: 96.1 fL (ref 80.0–100.0)
Monocytes Absolute: 0.4 10*3/uL (ref 0.1–1.0)
Monocytes Relative: 16 %
Neutro Abs: 1.5 10*3/uL — ABNORMAL LOW (ref 1.7–7.7)
Neutrophils Relative %: 59 %
Platelets: 112 10*3/uL — ABNORMAL LOW (ref 150–400)
RBC: 3.04 MIL/uL — ABNORMAL LOW (ref 4.22–5.81)
RDW: 18.9 % — ABNORMAL HIGH (ref 11.5–15.5)
WBC: 2.5 10*3/uL — ABNORMAL LOW (ref 4.0–10.5)
nRBC: 0 % (ref 0.0–0.2)

## 2020-07-11 LAB — COMPREHENSIVE METABOLIC PANEL
ALT: 19 U/L (ref 0–44)
AST: 21 U/L (ref 15–41)
Albumin: 3.2 g/dL — ABNORMAL LOW (ref 3.5–5.0)
Alkaline Phosphatase: 46 U/L (ref 38–126)
Anion gap: 7 (ref 5–15)
BUN: 26 mg/dL — ABNORMAL HIGH (ref 8–23)
CO2: 27 mmol/L (ref 22–32)
Calcium: 7.7 mg/dL — ABNORMAL LOW (ref 8.9–10.3)
Chloride: 100 mmol/L (ref 98–111)
Creatinine, Ser: 1.98 mg/dL — ABNORMAL HIGH (ref 0.61–1.24)
GFR, Estimated: 35 mL/min — ABNORMAL LOW (ref >60)
Glucose, Bld: 132 mg/dL — ABNORMAL HIGH (ref 70–99)
Potassium: 3.7 mmol/L (ref 3.5–5.1)
Sodium: 134 mmol/L — ABNORMAL LOW (ref 135–145)
Total Bilirubin: 0.4 mg/dL (ref 0.3–1.2)
Total Protein: 6.2 g/dL — ABNORMAL LOW (ref 6.5–8.1)

## 2020-07-11 MED ORDER — DIPHENHYDRAMINE HCL 25 MG PO CAPS
50.0000 mg | ORAL_CAPSULE | Freq: Once | ORAL | Status: AC
  Administered 2020-07-11: 11:00:00 50 mg via ORAL
  Filled 2020-07-11: qty 2

## 2020-07-11 MED ORDER — HEPARIN SOD (PORK) LOCK FLUSH 100 UNIT/ML IV SOLN
500.0000 [IU] | Freq: Once | INTRAVENOUS | Status: DC | PRN
  Filled 2020-07-11: qty 5

## 2020-07-11 MED ORDER — SODIUM CHLORIDE 0.9% FLUSH
10.0000 mL | INTRAVENOUS | Status: DC | PRN
  Filled 2020-07-11: qty 10

## 2020-07-11 MED ORDER — SODIUM CHLORIDE 0.9 % IV SOLN
Freq: Once | INTRAVENOUS | Status: AC
  Filled 2020-07-11: qty 250

## 2020-07-11 MED ORDER — ACETAMINOPHEN 325 MG PO TABS
650.0000 mg | ORAL_TABLET | Freq: Once | ORAL | Status: AC
  Administered 2020-07-11: 11:00:00 650 mg via ORAL
  Filled 2020-07-11: qty 2

## 2020-07-11 MED ORDER — SODIUM CHLORIDE 0.9 % IV SOLN
375.0000 mg/m2 | Freq: Once | INTRAVENOUS | Status: AC
  Administered 2020-07-11: 11:00:00 800 mg via INTRAVENOUS
  Filled 2020-07-11: qty 50

## 2020-07-11 NOTE — Progress Notes (Signed)
Tolerated Ruxience well. Discharged home in stable condition.

## 2020-07-15 ENCOUNTER — Other Ambulatory Visit: Payer: Self-pay | Admitting: Internal Medicine

## 2020-07-15 DIAGNOSIS — C8223 Follicular lymphoma grade III, unspecified, intra-abdominal lymph nodes: Secondary | ICD-10-CM

## 2020-07-16 NOTE — Telephone Encounter (Signed)
Unable to do [facility]S survey at this time. Next available survey date is 07/25/20

## 2020-07-23 MED ORDER — LENALIDOMIDE 15 MG PO CAPS: ORAL_CAPSULE | ORAL | 0 refills | Status: DC

## 2020-07-23 NOTE — Addendum Note (Signed)
Addended by: Gloris Ham on: 07/23/2020 11:26 AM   Modules accepted: Orders

## 2020-07-23 NOTE — Telephone Encounter (Signed)
RF submitted for Revlimid. Rems survey [id number]

## 2020-07-25 ENCOUNTER — Inpatient Hospital Stay (HOSPITAL_BASED_OUTPATIENT_CLINIC_OR_DEPARTMENT_OTHER): Payer: Medicare PPO | Admitting: Internal Medicine

## 2020-07-25 ENCOUNTER — Inpatient Hospital Stay: Payer: Medicare PPO | Attending: Internal Medicine

## 2020-07-25 ENCOUNTER — Other Ambulatory Visit: Payer: Self-pay

## 2020-07-25 DIAGNOSIS — Z5112 Encounter for antineoplastic immunotherapy: Secondary | ICD-10-CM | POA: Insufficient documentation

## 2020-07-25 DIAGNOSIS — E119 Type 2 diabetes mellitus without complications: Secondary | ICD-10-CM | POA: Diagnosis not present

## 2020-07-25 DIAGNOSIS — Z87891 Personal history of nicotine dependence: Secondary | ICD-10-CM | POA: Diagnosis not present

## 2020-07-25 DIAGNOSIS — Z79899 Other long term (current) drug therapy: Secondary | ICD-10-CM | POA: Diagnosis not present

## 2020-07-25 DIAGNOSIS — Z794 Long term (current) use of insulin: Secondary | ICD-10-CM | POA: Insufficient documentation

## 2020-07-25 DIAGNOSIS — C8223 Follicular lymphoma grade III, unspecified, intra-abdominal lymph nodes: Secondary | ICD-10-CM

## 2020-07-25 DIAGNOSIS — E038 Other specified hypothyroidism: Secondary | ICD-10-CM | POA: Diagnosis not present

## 2020-07-25 DIAGNOSIS — N183 Chronic kidney disease, stage 3 unspecified: Secondary | ICD-10-CM | POA: Insufficient documentation

## 2020-07-25 DIAGNOSIS — I10 Essential (primary) hypertension: Secondary | ICD-10-CM | POA: Insufficient documentation

## 2020-07-25 DIAGNOSIS — Z7982 Long term (current) use of aspirin: Secondary | ICD-10-CM | POA: Diagnosis present

## 2020-07-25 DIAGNOSIS — H548 Legal blindness, as defined in USA: Secondary | ICD-10-CM | POA: Insufficient documentation

## 2020-07-25 LAB — CBC WITH DIFFERENTIAL/PLATELET
Abs Immature Granulocytes: 0.07 10*3/uL (ref 0.00–0.07)
Basophils Absolute: 0.1 10*3/uL (ref 0.0–0.1)
Basophils Relative: 2 %
Eosinophils Absolute: 0.2 10*3/uL (ref 0.0–0.5)
Eosinophils Relative: 6 %
HCT: 29.8 % — ABNORMAL LOW (ref 39.0–52.0)
Hemoglobin: 10.1 g/dL — ABNORMAL LOW (ref 13.0–17.0)
Immature Granulocytes: 2 %
Lymphocytes Relative: 21 %
Lymphs Abs: 0.8 10*3/uL (ref 0.7–4.0)
MCH: 33.9 pg (ref 26.0–34.0)
MCHC: 33.9 g/dL (ref 30.0–36.0)
MCV: 100 fL (ref 80.0–100.0)
Monocytes Absolute: 0.6 10*3/uL (ref 0.1–1.0)
Monocytes Relative: 17 %
Neutro Abs: 2 10*3/uL (ref 1.7–7.7)
Neutrophils Relative %: 52 %
Platelets: 235 10*3/uL (ref 150–400)
RBC: 2.98 MIL/uL — ABNORMAL LOW (ref 4.22–5.81)
RDW: 19.4 % — ABNORMAL HIGH (ref 11.5–15.5)
WBC: 3.8 10*3/uL — ABNORMAL LOW (ref 4.0–10.5)
nRBC: 0 % (ref 0.0–0.2)

## 2020-07-25 LAB — COMPREHENSIVE METABOLIC PANEL
ALT: 25 U/L (ref 0–44)
AST: 20 U/L (ref 15–41)
Albumin: 3.3 g/dL — ABNORMAL LOW (ref 3.5–5.0)
Alkaline Phosphatase: 65 U/L (ref 38–126)
Anion gap: 11 (ref 5–15)
BUN: 23 mg/dL (ref 8–23)
CO2: 23 mmol/L (ref 22–32)
Calcium: 8.5 mg/dL — ABNORMAL LOW (ref 8.9–10.3)
Chloride: 101 mmol/L (ref 98–111)
Creatinine, Ser: 2.11 mg/dL — ABNORMAL HIGH (ref 0.61–1.24)
GFR, Estimated: 33 mL/min — ABNORMAL LOW (ref >60)
Glucose, Bld: 196 mg/dL — ABNORMAL HIGH (ref 70–99)
Potassium: 4.2 mmol/L (ref 3.5–5.1)
Sodium: 135 mmol/L (ref 135–145)
Total Bilirubin: 0.2 mg/dL — ABNORMAL LOW (ref 0.3–1.2)
Total Protein: 6.4 g/dL — ABNORMAL LOW (ref 6.5–8.1)

## 2020-07-25 NOTE — Assessment & Plan Note (Addendum)
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  Will monitor for now given patient's limited options.   # Currently s/p cycle 5 ;day-15 today-Rituxan-revlimid; platelets 235.  Monitor closely.will plan Rituxan IV again # 6 in 2 weeks. Discussed that will plan a total of 6 cycles of Rituximab; and then will continue 12 cycles of revlimid.   #Diabetes/brittle- -poorly controlled;  BG- 163; STABLE.    # Thyroiditis- incidental of PET scan; DEC TSH- 43; currently on Synthroid to 175 mcg. Thyroid profile- JAN 2022- WNL. Continue current dose of synthroid.  # CKD stage III-GFR -33; diabetes- [Dr.kolluru]; STABLE.   # Prophylaxis for DVTasprin 81 mg/day/revlimid.   # DISPOSITION:  # follow up in 2 weeks- X-MD; labs- cbc/cmp; Rituxan IV [NOT SQ]  [wed];   # follow up in 4 weeks- MD; labs- cbc/cmp;Dr.B
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
## 2020-07-26 LAB — THYROID PANEL WITH TSH
Free Thyroxine Index: 1.9 (ref 1.2–4.9)
T3 Uptake Ratio: 27 % (ref 24–39)
T4, Total: 7.2 ug/dL (ref 4.5–12.0)
TSH: 2.15 u[IU]/mL (ref 0.450–4.500)

## 2020-08-08 ENCOUNTER — Telehealth: Payer: Self-pay | Admitting: *Deleted

## 2020-08-08 ENCOUNTER — Inpatient Hospital Stay (HOSPITAL_BASED_OUTPATIENT_CLINIC_OR_DEPARTMENT_OTHER): Payer: Medicare PPO | Admitting: Internal Medicine

## 2020-08-08 ENCOUNTER — Inpatient Hospital Stay: Payer: Medicare PPO

## 2020-08-08 ENCOUNTER — Encounter: Payer: Self-pay | Admitting: Internal Medicine

## 2020-08-08 VITALS — BP 118/96 | HR 57 | Temp 97.9°F | Resp 16 | Ht 67.0 in | Wt 177.0 lb

## 2020-08-08 VITALS — BP 99/62 | HR 51 | Temp 97.0°F | Resp 17

## 2020-08-08 DIAGNOSIS — R059 Cough, unspecified: Secondary | ICD-10-CM

## 2020-08-08 DIAGNOSIS — C8223 Follicular lymphoma grade III, unspecified, intra-abdominal lymph nodes: Secondary | ICD-10-CM

## 2020-08-08 DIAGNOSIS — Z7189 Other specified counseling: Secondary | ICD-10-CM

## 2020-08-08 DIAGNOSIS — Z5112 Encounter for antineoplastic immunotherapy: Secondary | ICD-10-CM | POA: Diagnosis not present

## 2020-08-08 LAB — CBC WITH DIFFERENTIAL/PLATELET
Abs Immature Granulocytes: 0.02 10*3/uL (ref 0.00–0.07)
Basophils Absolute: 0.1 10*3/uL (ref 0.0–0.1)
Basophils Relative: 4 %
Eosinophils Absolute: 0 10*3/uL (ref 0.0–0.5)
Eosinophils Relative: 1 %
HCT: 30.6 % — ABNORMAL LOW (ref 39.0–52.0)
Hemoglobin: 10.5 g/dL — ABNORMAL LOW (ref 13.0–17.0)
Immature Granulocytes: 1 %
Lymphocytes Relative: 18 %
Lymphs Abs: 0.7 10*3/uL (ref 0.7–4.0)
MCH: 34.2 pg — ABNORMAL HIGH (ref 26.0–34.0)
MCHC: 34.3 g/dL (ref 30.0–36.0)
MCV: 99.7 fL (ref 80.0–100.0)
Monocytes Absolute: 0.9 10*3/uL (ref 0.1–1.0)
Monocytes Relative: 23 %
Neutro Abs: 2 10*3/uL (ref 1.7–7.7)
Neutrophils Relative %: 53 %
Platelets: 151 10*3/uL (ref 150–400)
RBC: 3.07 MIL/uL — ABNORMAL LOW (ref 4.22–5.81)
RDW: 18.4 % — ABNORMAL HIGH (ref 11.5–15.5)
WBC: 3.7 10*3/uL — ABNORMAL LOW (ref 4.0–10.5)
nRBC: 0 % (ref 0.0–0.2)

## 2020-08-08 LAB — COMPREHENSIVE METABOLIC PANEL
ALT: 26 U/L (ref 0–44)
AST: 27 U/L (ref 15–41)
Albumin: 3.4 g/dL — ABNORMAL LOW (ref 3.5–5.0)
Alkaline Phosphatase: 59 U/L (ref 38–126)
Anion gap: 9 (ref 5–15)
BUN: 24 mg/dL — ABNORMAL HIGH (ref 8–23)
CO2: 23 mmol/L (ref 22–32)
Calcium: 8.5 mg/dL — ABNORMAL LOW (ref 8.9–10.3)
Chloride: 101 mmol/L (ref 98–111)
Creatinine, Ser: 2.17 mg/dL — ABNORMAL HIGH (ref 0.61–1.24)
GFR, Estimated: 32 mL/min — ABNORMAL LOW (ref >60)
Glucose, Bld: 120 mg/dL — ABNORMAL HIGH (ref 70–99)
Potassium: 4.4 mmol/L (ref 3.5–5.1)
Sodium: 133 mmol/L — ABNORMAL LOW (ref 135–145)
Total Bilirubin: 0.3 mg/dL (ref 0.3–1.2)
Total Protein: 6.3 g/dL — ABNORMAL LOW (ref 6.5–8.1)

## 2020-08-08 MED ORDER — DIPHENHYDRAMINE HCL 25 MG PO CAPS
50.0000 mg | ORAL_CAPSULE | Freq: Once | ORAL | Status: AC
  Administered 2020-08-08: 50 mg via ORAL
  Filled 2020-08-08: qty 2

## 2020-08-08 MED ORDER — ACETAMINOPHEN 325 MG PO TABS
650.0000 mg | ORAL_TABLET | Freq: Once | ORAL | Status: AC
  Administered 2020-08-08: 650 mg via ORAL
  Filled 2020-08-08: qty 2

## 2020-08-08 MED ORDER — SODIUM CHLORIDE 0.9 % IV SOLN
800.0000 mg | Freq: Once | INTRAVENOUS | Status: DC
Start: 2020-08-08 — End: 2020-08-08

## 2020-08-08 MED ORDER — LEVOTHYROXINE SODIUM 175 MCG PO TABS: 175.0000 ug | ORAL_TABLET | Freq: Every day | ORAL | 3 refills | Status: DC

## 2020-08-08 MED ORDER — SODIUM CHLORIDE 0.9 % IV SOLN
Freq: Once | INTRAVENOUS | Status: AC
  Filled 2020-08-08: qty 250

## 2020-08-08 MED ORDER — SODIUM CHLORIDE 0.9 % IV SOLN
700.0000 mg | Freq: Once | INTRAVENOUS | Status: AC
  Administered 2020-08-08: 700 mg via INTRAVENOUS
  Filled 2020-08-08: qty 50
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
## 2020-08-08 NOTE — Progress Notes (Signed)
Pt tolerated treatment well with no signs of complications. VSS Pt stable at discharge. Pt escorted out by RN to be driven home by wife.   Raelea Gosse [employer]

## 2020-08-08 NOTE — Telephone Encounter (Signed)
Faxed referral clarification to ENT.

## 2020-08-08 NOTE — Assessment & Plan Note (Addendum)
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    # proceed with Rituxan- cycle 6 ;day-1 with revlimid; platelets 151. Will continue a total of 12 cycles of revlimid. Will repeat PET scan in 2 months.   # Chronic mild Cough-/scratchy throat- recommend ENT evaluation.   #Diabetes/brittle- -poorly controlled;  BG- 163; STABLE.    # Thyroiditis- incidental of PET scan; DEC TSH- 43; on Synthroid to 175 mcg. Thyroid profile- JAN 2022- WNL. STABLE;  Continue current dose of synthroid; new refill.   # CKD stage III-GFR -33; diabetes- [Dr.kolluru]; STABLE.   # Prophylaxis for DVTasprin 81 mg/day/revlimid.   # DISPOSITION: # Rituxan- today.  #[place]  ENT referral- re: cough # follow up in 4 weeks- MD; labs- cbc/cmp;Dr.B

## 2020-08-08 NOTE — Progress Notes (Signed)
Needs refill on levothyroxine 100 mcg. Prefers 90 day supply. Feels very tired today.

## 2020-08-08 NOTE — Telephone Encounter (Signed)
-----   Message from Secundino Ginger sent at 08/08/2020  1:45 PM EST ----- Regarding: Reason for referral Reason for Referral from [place] [facility] scanned to chart

## 2020-08-13 ENCOUNTER — Inpatient Hospital Stay
Admission: EM | Admit: 2020-08-13 | Discharge: 2020-08-16 | DRG: 177 | Disposition: A | Discharge status: Home Health | Payer: Medicare PPO | Attending: Internal Medicine | Admitting: Internal Medicine

## 2020-08-13 ENCOUNTER — Other Ambulatory Visit: Payer: Self-pay

## 2020-08-13 ENCOUNTER — Emergency Department: Payer: Medicare PPO

## 2020-08-13 DIAGNOSIS — Z79899 Other long term (current) drug therapy: Secondary | ICD-10-CM

## 2020-08-13 DIAGNOSIS — D6959 Other secondary thrombocytopenia: Secondary | ICD-10-CM | POA: Diagnosis present

## 2020-08-13 DIAGNOSIS — N1832 Chronic kidney disease, stage 3b: Secondary | ICD-10-CM | POA: Diagnosis not present

## 2020-08-13 DIAGNOSIS — N179 Acute kidney failure, unspecified: Secondary | ICD-10-CM

## 2020-08-13 DIAGNOSIS — U071 COVID-19: Secondary | ICD-10-CM | POA: Diagnosis not present

## 2020-08-13 DIAGNOSIS — Z794 Long term (current) use of insulin: Secondary | ICD-10-CM

## 2020-08-13 DIAGNOSIS — F341 Dysthymic disorder: Secondary | ICD-10-CM

## 2020-08-13 DIAGNOSIS — E1122 Type 2 diabetes mellitus with diabetic chronic kidney disease: Secondary | ICD-10-CM | POA: Diagnosis present

## 2020-08-13 DIAGNOSIS — R531 Weakness: Secondary | ICD-10-CM | POA: Diagnosis not present

## 2020-08-13 DIAGNOSIS — D649 Anemia, unspecified: Secondary | ICD-10-CM | POA: Diagnosis not present

## 2020-08-13 DIAGNOSIS — H543 Unqualified visual loss, both eyes: Secondary | ICD-10-CM

## 2020-08-13 DIAGNOSIS — Z87891 Personal history of nicotine dependence: Secondary | ICD-10-CM

## 2020-08-13 DIAGNOSIS — D72819 Decreased white blood cell count, unspecified: Secondary | ICD-10-CM

## 2020-08-13 DIAGNOSIS — N183 Chronic kidney disease, stage 3 unspecified: Secondary | ICD-10-CM | POA: Diagnosis present

## 2020-08-13 DIAGNOSIS — E119 Type 2 diabetes mellitus without complications: Secondary | ICD-10-CM

## 2020-08-13 DIAGNOSIS — Z7982 Long term (current) use of aspirin: Secondary | ICD-10-CM

## 2020-08-13 DIAGNOSIS — C8290 Follicular lymphoma, unspecified, unspecified site: Secondary | ICD-10-CM | POA: Diagnosis not present

## 2020-08-13 DIAGNOSIS — D701 Agranulocytosis secondary to cancer chemotherapy: Secondary | ICD-10-CM | POA: Diagnosis present

## 2020-08-13 DIAGNOSIS — D61810 Antineoplastic chemotherapy induced pancytopenia: Secondary | ICD-10-CM | POA: Diagnosis present

## 2020-08-13 DIAGNOSIS — Z923 Personal history of irradiation: Secondary | ICD-10-CM

## 2020-08-13 DIAGNOSIS — N1831 Chronic kidney disease, stage 3a: Secondary | ICD-10-CM | POA: Diagnosis not present

## 2020-08-13 DIAGNOSIS — J1282 Pneumonia due to coronavirus disease 2019: Secondary | ICD-10-CM

## 2020-08-13 DIAGNOSIS — T451X5A Adverse effect of antineoplastic and immunosuppressive drugs, initial encounter: Secondary | ICD-10-CM | POA: Diagnosis present

## 2020-08-13 DIAGNOSIS — Z0184 Encounter for antibody response examination: Secondary | ICD-10-CM

## 2020-08-13 DIAGNOSIS — C8223 Follicular lymphoma grade III, unspecified, intra-abdominal lymph nodes: Secondary | ICD-10-CM

## 2020-08-13 DIAGNOSIS — Z79890 Hormone replacement therapy (postmenopausal): Secondary | ICD-10-CM

## 2020-08-13 DIAGNOSIS — I129 Hypertensive chronic kidney disease with stage 1 through stage 4 chronic kidney disease, or unspecified chronic kidney disease: Secondary | ICD-10-CM | POA: Diagnosis present

## 2020-08-13 DIAGNOSIS — H548 Legal blindness, as defined in USA: Secondary | ICD-10-CM | POA: Diagnosis present

## 2020-08-13 DIAGNOSIS — D631 Anemia in chronic kidney disease: Secondary | ICD-10-CM | POA: Diagnosis present

## 2020-08-13 DIAGNOSIS — E039 Hypothyroidism, unspecified: Secondary | ICD-10-CM | POA: Diagnosis present

## 2020-08-13 DIAGNOSIS — F32A Depression, unspecified: Secondary | ICD-10-CM | POA: Diagnosis present

## 2020-08-13 LAB — BASIC METABOLIC PANEL
Anion gap: 11 (ref 5–15)
BUN: 23 mg/dL (ref 8–23)
CO2: 20 mmol/L — ABNORMAL LOW (ref 22–32)
Calcium: 8.5 mg/dL — ABNORMAL LOW (ref 8.9–10.3)
Chloride: 101 mmol/L (ref 98–111)
Creatinine, Ser: 2.23 mg/dL — ABNORMAL HIGH (ref 0.61–1.24)
GFR, Estimated: 31 mL/min — ABNORMAL LOW (ref >60)
Glucose, Bld: 128 mg/dL — ABNORMAL HIGH (ref 70–99)
Potassium: 3.9 mmol/L (ref 3.5–5.1)
Sodium: 132 mmol/L — ABNORMAL LOW (ref 135–145)

## 2020-08-13 LAB — CBC
HCT: 30 % — ABNORMAL LOW (ref 39.0–52.0)
Hemoglobin: 10.4 g/dL — ABNORMAL LOW (ref 13.0–17.0)
MCH: 34.7 pg — ABNORMAL HIGH (ref 26.0–34.0)
MCHC: 34.7 g/dL (ref 30.0–36.0)
MCV: 100 fL (ref 80.0–100.0)
Platelets: UNDETERMINED 10*3/uL (ref 150–400)
RBC: 3 MIL/uL — ABNORMAL LOW (ref 4.22–5.81)
RDW: 18.9 % — ABNORMAL HIGH (ref 11.5–15.5)
WBC: 2.2 10*3/uL — ABNORMAL LOW (ref 4.0–10.5)
nRBC: 0 % (ref 0.0–0.2)

## 2020-08-13 LAB — CBG MONITORING, ED: Glucose-Capillary: 121 mg/dL — ABNORMAL HIGH (ref 70–99)

## 2020-08-13 LAB — URINALYSIS, COMPLETE (UACMP) WITH MICROSCOPIC
Bacteria, UA: NONE SEEN
Bilirubin Urine: NEGATIVE
Glucose, UA: NEGATIVE mg/dL
Ketones, ur: NEGATIVE mg/dL
Leukocytes,Ua: NEGATIVE
Nitrite: NEGATIVE
Protein, ur: 30 mg/dL — AB
Specific Gravity, Urine: 1.01 (ref 1.005–1.030)
Squamous Epithelial / HPF: NONE SEEN (ref 0–5)
pH: 5 (ref 5.0–8.0)

## 2020-08-13 LAB — LACTATE DEHYDROGENASE: LDH: 152 U/L (ref 98–192)

## 2020-08-13 LAB — C-REACTIVE PROTEIN: CRP: 8.4 mg/dL — ABNORMAL HIGH (ref <1.0)

## 2020-08-13 LAB — TRIGLYCERIDES: Triglycerides: 51 mg/dL (ref <150)

## 2020-08-13 LAB — GLUCOSE, CAPILLARY: Glucose-Capillary: 221 mg/dL — ABNORMAL HIGH (ref 70–99)

## 2020-08-13 LAB — FIBRIN DERIVATIVES D-DIMER (ARMC ONLY): Fibrin derivatives D-dimer (ARMC): 950.23 ng/mL (FEU) — ABNORMAL HIGH (ref 0.00–499.00)

## 2020-08-13 LAB — PROCALCITONIN: Procalcitonin: 0.25 ng/mL

## 2020-08-13 LAB — FIBRINOGEN: Fibrinogen: 475 mg/dL (ref 210–475)

## 2020-08-13 LAB — LACTIC ACID, PLASMA: Lactic Acid, Venous: 1.1 mmol/L (ref 0.5–1.9)

## 2020-08-13 LAB — SARS CORONAVIRUS 2 BY RT PCR (HOSPITAL ORDER, PERFORMED IN ~~LOC~~ HOSPITAL LAB): SARS Coronavirus 2: POSITIVE — AB

## 2020-08-13 LAB — FERRITIN: Ferritin: 694 ng/mL — ABNORMAL HIGH (ref 24–336)

## 2020-08-13 MED ORDER — DEXAMETHASONE SODIUM PHOSPHATE 10 MG/ML IJ SOLN
8.0000 mg | Freq: Once | INTRAMUSCULAR | Status: AC
  Administered 2020-08-13: 8 mg via INTRAVENOUS
  Filled 2020-08-13: qty 1

## 2020-08-13 MED ORDER — VITAMIN D 25 MCG (1000 UNIT) PO TABS
2000.0000 [IU] | ORAL_TABLET | Freq: Every day | ORAL | Status: DC
  Administered 2020-08-14 – 2020-08-16 (×3): 2000 [IU] via ORAL
  Filled 2020-08-13 (×3): qty 2

## 2020-08-13 MED ORDER — SODIUM CHLORIDE 0.9 % IV SOLN: INTRAVENOUS | Status: DC

## 2020-08-13 MED ORDER — ZINC SULFATE 220 (50 ZN) MG PO CAPS
220.0000 mg | ORAL_CAPSULE | Freq: Every day | ORAL | Status: DC
  Administered 2020-08-14 – 2020-08-16 (×3): 220 mg via ORAL
  Filled 2020-08-13 (×3): qty 1

## 2020-08-13 MED ORDER — HYDROCOD POLST-CPM POLST ER 10-8 MG/5ML PO SUER
5.0000 mL | Freq: Two times a day (BID) | ORAL | Status: DC | PRN
Start: 2020-08-13 — End: 2020-08-16

## 2020-08-13 MED ORDER — ADULT MULTIVITAMIN W/MINERALS CH
1.0000 | ORAL_TABLET | Freq: Every day | ORAL | Status: DC
  Administered 2020-08-14 – 2020-08-16 (×3): 1 via ORAL
  Filled 2020-08-13 (×3): qty 1

## 2020-08-13 MED ORDER — SODIUM CHLORIDE 0.9 % IV SOLN
1000.0000 mL | Freq: Once | INTRAVENOUS | Status: AC
  Administered 2020-08-13: 1000 mL via INTRAVENOUS

## 2020-08-13 MED ORDER — ONDANSETRON HCL 4 MG PO TABS: 4.0000 mg | ORAL_TABLET | Freq: Four times a day (QID) | ORAL | Status: DC | PRN

## 2020-08-13 MED ORDER — METHYLPREDNISOLONE SODIUM SUCC 125 MG IJ SOLR
1.0000 mg/kg | Freq: Two times a day (BID) | INTRAMUSCULAR | Status: DC
  Administered 2020-08-14 – 2020-08-16 (×5): 80 mg via INTRAVENOUS
  Filled 2020-08-13 (×5): qty 2

## 2020-08-13 MED ORDER — PREDNISONE 50 MG PO TABS: 50.0000 mg | ORAL_TABLET | Freq: Every day | ORAL | Status: DC

## 2020-08-13 MED ORDER — NIFEDIPINE ER 60 MG PO TB24
60.0000 mg | ORAL_TABLET | Freq: Every day | ORAL | Status: DC
  Administered 2020-08-14 – 2020-08-16 (×3): 60 mg via ORAL
  Filled 2020-08-13 (×4): qty 1

## 2020-08-13 MED ORDER — SODIUM CHLORIDE 0.9 % IV SOLN: 100.0000 mg | Freq: Every day | INTRAVENOUS | Status: DC

## 2020-08-13 MED ORDER — PANTOPRAZOLE SODIUM 40 MG PO TBEC
40.0000 mg | DELAYED_RELEASE_TABLET | Freq: Every day | ORAL | Status: DC
  Administered 2020-08-14 – 2020-08-16 (×3): 40 mg via ORAL
  Filled 2020-08-13 (×3): qty 1

## 2020-08-13 MED ORDER — GUAIFENESIN-DM 100-10 MG/5ML PO SYRP: 10.0000 mL | ORAL_SOLUTION | ORAL | Status: DC | PRN

## 2020-08-13 MED ORDER — ENOXAPARIN SODIUM 30 MG/0.3ML ~~LOC~~ SOLN
30.0000 mg | SUBCUTANEOUS | Status: DC
  Administered 2020-08-13 – 2020-08-14 (×2): 30 mg via SUBCUTANEOUS
  Filled 2020-08-13 (×3): qty 0.3

## 2020-08-13 MED ORDER — INSULIN ASPART 100 UNIT/ML ~~LOC~~ SOLN
0.0000 [IU] | Freq: Three times a day (TID) | SUBCUTANEOUS | Status: DC
  Administered 2020-08-14: 10:00:00 1 [IU] via SUBCUTANEOUS
  Administered 2020-08-14 (×2): 3 [IU] via SUBCUTANEOUS
  Administered 2020-08-15: 2 [IU] via SUBCUTANEOUS
  Administered 2020-08-15: 13:00:00 3 [IU] via SUBCUTANEOUS
  Administered 2020-08-15: 17:00:00 2 [IU] via SUBCUTANEOUS
  Administered 2020-08-16: 08:00:00 3 [IU] via SUBCUTANEOUS
  Filled 2020-08-13 (×7): qty 1

## 2020-08-13 MED ORDER — LINAGLIPTIN 5 MG PO TABS
5.0000 mg | ORAL_TABLET | Freq: Every day | ORAL | Status: DC
  Administered 2020-08-14 – 2020-08-16 (×3): 5 mg via ORAL
  Filled 2020-08-13 (×4): qty 1

## 2020-08-13 MED ORDER — ASPIRIN EC 81 MG PO TBEC
81.0000 mg | DELAYED_RELEASE_TABLET | Freq: Every day | ORAL | Status: DC
  Administered 2020-08-14 – 2020-08-16 (×3): 81 mg via ORAL
  Filled 2020-08-13 (×3): qty 1

## 2020-08-13 MED ORDER — ASCORBIC ACID 500 MG PO TABS
500.0000 mg | ORAL_TABLET | Freq: Every day | ORAL | Status: DC
  Administered 2020-08-14 – 2020-08-16 (×3): 500 mg via ORAL
  Filled 2020-08-13 (×3): qty 1

## 2020-08-13 MED ORDER — INSULIN ASPART 100 UNIT/ML FLEXPEN: 14.0000 [IU] | PEN_INJECTOR | Freq: Every day | SUBCUTANEOUS | Status: DC

## 2020-08-13 MED ORDER — METOPROLOL TARTRATE 50 MG PO TABS
100.0000 mg | ORAL_TABLET | Freq: Every day | ORAL | Status: DC
  Administered 2020-08-14 – 2020-08-16 (×3): 100 mg via ORAL
  Filled 2020-08-13 (×3): qty 2

## 2020-08-13 MED ORDER — SODIUM CHLORIDE 0.9 % IV SOLN
200.0000 mg | Freq: Once | INTRAVENOUS | Status: DC
  Filled 2020-08-13: qty 40

## 2020-08-13 MED ORDER — LEVOTHYROXINE SODIUM 50 MCG PO TABS
175.0000 ug | ORAL_TABLET | Freq: Every day | ORAL | Status: DC
  Administered 2020-08-14 – 2020-08-16 (×3): 175 ug via ORAL
  Filled 2020-08-13 (×3): qty 1

## 2020-08-13 MED ORDER — INSULIN ASPART 100 UNIT/ML ~~LOC~~ SOLN
0.0000 [IU] | Freq: Every day | SUBCUTANEOUS | Status: DC
  Administered 2020-08-13: 2 [IU] via SUBCUTANEOUS
  Filled 2020-08-13: qty 1

## 2020-08-13 MED ORDER — ONDANSETRON HCL 4 MG/2ML IJ SOLN: 4.0000 mg | Freq: Four times a day (QID) | INTRAMUSCULAR | Status: DC | PRN

## 2020-08-13 MED ORDER — VITAMIN D2 50 MCG (2000 UT) PO TABS: 1.0000 | ORAL_TABLET | Freq: Every day | ORAL | Status: DC

## 2020-08-13 MED ORDER — ALBUTEROL SULFATE HFA 108 (90 BASE) MCG/ACT IN AERS
2.0000 | INHALATION_SPRAY | RESPIRATORY_TRACT | Status: AC
  Administered 2020-08-13 – 2020-08-15 (×9): 2 via RESPIRATORY_TRACT
  Filled 2020-08-13: qty 6.7

## 2020-08-13 MED ORDER — ACETAMINOPHEN 325 MG PO TABS: 325.0000 mg | ORAL_TABLET | Freq: Four times a day (QID) | ORAL | Status: DC | PRN

## 2020-08-13 NOTE — ED Provider Notes (Signed)
[facility] Emergency Department Provider Note   ____________________________________________    I have reviewed the triage vital signs and the nursing notes.   HISTORY  Chief Complaint Weakness and Cough     HPI Mitchell Chang. is a 70 y.o. male with history of non-Hodgkin's lymphoma, diabetes, hypertension who is on oral chemotherapy who sees Dr. Rogue Bussing for oncology who presents with complaints of severe weakness.  He also describes a cough that he has had for some time.  He is blind and is typically able to get around his house without difficulty and is quite independent.  Today he was so weak that he was unable to stand or walk, he states he would have fallen if he did not sit back down.  Apparently family member recently tested positive for Covid  Past Medical History:  Diagnosis Date  . Cancer ([place])    non-hodgkins lymphoma  . Chronic kidney disease   . Diabetes mellitus without complication ([place])   . Hypertension     Patient Active Problem List   Diagnosis Date Noted  . Weakness 08/13/2020  . Goals of care, counseling/discussion 11/27/2019  . Clinical depression 12/02/2015  . Controlled type 2 diabetes mellitus without complication ([place]) 81/82/9937  . Follicular lymphoma grade III of intra-abdominal lymph nodes ([place]) 06/13/2015  . Type 2 diabetes mellitus ([place]) 04/30/2015  . Blindness of both eyes 02/07/2015  . 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 BP (high blood pressure) 02/07/2015  . Lymphoma, non-Hodgkin's ([place]) 02/07/2015  . Diabetes ([place]) 02/07/2015  . Chronic kidney disease (CKD), stage III (moderate) ([place]) 12/09/2013  . Acid reflux 12/09/2013  . Psoriasis 12/09/2013    Past Surgical History:  Procedure Laterality Date  . COLONOSCOPY    . COLONOSCOPY WITH PROPOFOL N/A 05/10/2015   Procedure: COLONOSCOPY WITH PROPOFOL;  Surgeon: Hulen Luster, MD;  Location: [facility] ENDOSCOPY;  Service: Gastroenterology;  Laterality: N/A;  . EYE  SURGERY    . NOSE SURGERY      Prior to Admission medications   Medication Sig Start Date End Date Taking? Authorizing Provider  aspirin EC 81 MG tablet Take 81 mg by mouth daily.     [provider]  clobetasol cream (TEMOVATE) 1.69 % Apply 1 application topically as needed (itching).     [provider]  doxepin (SINEQUAN) 50 MG capsule Take 50 mg by mouth at bedtime.  01/10/14   [provider]  enalapril (VASOTEC) 20 MG tablet TAKE ONE TABLET BY MOUTH EVERY DAY 11/17/14   [provider]  Ergocalciferol (VITAMIN D2) 2000 units TABS Take 1 tablet by mouth daily.    [provider]  insulin aspart (NOVOLOG) 100 UNIT/ML FlexPen Inject into the skin. 14 units at breakfast and 20 units at dinner    [provider]  ketoconazole (NIZORAL) 2 % shampoo Apply 1 application topically as needed for irritation.    [provider]  lenalidomide (REVLIMID) 15 MG capsule TAKE 1 CAPSULE BY MOUTH EVERY DAY FOR 21 DAYS THEN OFF FOR 7 DAYS 07/23/20   Cammie Sickle, MD  levothyroxine (SYNTHROID) 175 MCG tablet Take 1 tablet (175 mcg total) by mouth daily before breakfast. 08/08/20   Cammie Sickle, MD  metoprolol (LOPRESSOR) 100 MG tablet Take 100 mg by mouth daily.  11/24/13   [provider]  Multiple Vitamin (MULTIVITAMIN WITH MINERALS) TABS tablet Take 1 tablet by mouth daily.    [provider]  NIFEdipine (PROCARDIA XL/ADALAT-CC) 60  MG 24 hr tablet Take 60 mg by mouth daily.     [provider]  omeprazole (PRILOSEC) 20 MG capsule Take 20 mg by mouth daily.  06/16/14   [provider]  prednisoLONE sodium phosphate (INFLAMASE FORTE) 1 % ophthalmic solution Place 1 % into both eyes daily as needed (eye irritation).     [provider]  sitaGLIPtin (JANUVIA) 50 MG tablet TAKE ONE TABLET BY MOUTH EVERY DAY 10/03/14   [provider]  TRESIBA FLEXTOUCH 200 UNIT/ML SOPN Inject 58 Units  into the skin at bedtime.  01/24/18   [provider]     Allergies Sulfa antibiotics  No family history on file.  Social History Social History   Tobacco Use  . Smoking status: Former Research scientist (life sciences)  . Smokeless tobacco: Never Used  Substance Use Topics  . Alcohol use: Not Currently  . Drug use: Not Currently    Review of Systems  Constitutional: No fever, severe weakness Eyes: No visual changes.  ENT: No sore throat. Cardiovascular: Denies chest pain. Respiratory: Cough, shortness of breath Gastrointestinal: No abdominal pain.  No nausea, no vomiting.   Genitourinary: Negative for dysuria. Musculoskeletal: Negative for back pain. Skin: Negative for rash. Neurological: Negative for headaches or weakness   ____________________________________________   PHYSICAL EXAM:  VITAL SIGNS: ED Triage Vitals  Enc Vitals Group     BP 08/13/20 0744 (!) 119/59     Pulse Rate 08/13/20 0745 80     Resp 08/13/20 0744 18     Temp 08/13/20 0744 98.6 F (37 C)     Temp Source 08/13/20 0744 Oral     SpO2 08/13/20 0745 97 %     Weight 08/13/20 0745 80.3 kg (177 lb)     Height 08/13/20 0745 1.702 m (5\' 7" )     Head Circumference --      Peak Flow --      Pain Score 08/13/20 0745 7     Pain Loc --      Pain Edu? --      Excl. in Veteran? --     Constitutional: Alert and oriented. No acute distress.   Nose: No congestion/rhinnorhea. Mouth/Throat: Mucous membranes are moist.    Cardiovascular: Normal rate, regular rhythm. Grossly normal heart sounds.  Good peripheral circulation. Respiratory: Normal respiratory effort.  No retractions. Lungs CTAB. Gastrointestinal: Soft and nontender. No distention.  No CVA tenderness.  Musculoskeletal: No lower extremity tenderness nor edema.  Warm and well perfused Neurologic:  Normal speech and language. No gross focal neurologic deficits are appreciated.  Skin:  Skin is warm, dry and intact. No rash noted. Psychiatric: Mood and affect are  normal. Speech and behavior are normal.  ____________________________________________   LABS (all labs ordered are listed, but only abnormal results are displayed)  Labs Reviewed  SARS CORONAVIRUS 2 BY RT PCR (HOSPITAL ORDER, [place]) - Abnormal; Notable for the following components:      Result Value   SARS Coronavirus 2 POSITIVE (*)    All other components within normal limits  BASIC METABOLIC PANEL - Abnormal; Notable for the following components:   Sodium 132 (*)    CO2 20 (*)    Glucose, Bld 128 (*)    Creatinine, Ser 2.23 (*)    Calcium 8.5 (*)    GFR, Estimated 31 (*)    All other components within normal limits  CBC - Abnormal; Notable for the following components:   WBC 2.2 (*)  RBC 3.00 (*)    Hemoglobin 10.4 (*)    HCT 30.0 (*)    MCH 34.7 (*)    RDW 18.9 (*)    All other components within normal limits  URINALYSIS, COMPLETE (UACMP) WITH MICROSCOPIC - Abnormal; Notable for the following components:   Color, Urine YELLOW (*)    APPearance CLEAR (*)    Hgb urine dipstick SMALL (*)    Protein, ur 30 (*)    All other components within normal limits  CULTURE, BLOOD (ROUTINE X 2)  CULTURE, BLOOD (ROUTINE X 2)  LACTIC ACID, PLASMA  LACTIC ACID, PLASMA  FIBRIN DERIVATIVES D-DIMER ([facility] ONLY)  PROCALCITONIN  LACTATE DEHYDROGENASE  FERRITIN  TRIGLYCERIDES  FIBRINOGEN  C-REACTIVE PROTEIN   ____________________________________________  EKG  ED ECG REPORT I, Lavonia Drafts, the attending physician, personally viewed and interpreted this ECG.  Date: 08/13/2020  Rhythm: normal sinus rhythm QRS Axis: normal Intervals: First-degree AV block ST/T Wave abnormalities: normal Narrative Interpretation: no evidence of acute ischemia  ____________________________________________  RADIOLOGY  Chest x-ray viewed by me, no acute abnormality CT chest reviewed by me, consistent with atypical  pneumonia ____________________________________________   PROCEDURES  Procedure(s) performed: No  Procedures   Critical Care performed: No ____________________________________________   INITIAL IMPRESSION / ASSESSMENT AND PLAN / ED COURSE  Pertinent labs & imaging results that were available during my care of the patient were reviewed by me and considered in my medical decision making (see chart for details).  Patient presents with severe weakness as detailed above.  He is immunocompromised with family member who tested positive for Covid.  He has had multiple vaccines and booster but he is at high risk for Covid regardless  Covid PCR sent.  Does report cough for several months, will send for CT Noncon, he is not allowed to have IV dye  Covid is positive  CT scan demonstrates atypical pneumonia consistent with Covid pneumonia.  This is likely the cause of his severe weakness.  Notified Dr. Rogue Bussing, will admit to the hospital service    ____________________________________________   FINAL CLINICAL IMPRESSION(S) / ED DIAGNOSES  Final diagnoses:  Pneumonia due to COVID-19 virus  Generalized weakness        Note:  This document was prepared using Dragon voice recognition software and may include unintentional dictation errors.   Lavonia Drafts, MD 08/13/20 [phone]

## 2020-08-13 NOTE — ED Notes (Signed)
Admitting MD at bedside.

## 2020-08-13 NOTE — ED Triage Notes (Addendum)
Pt is currently getting chemotherapy for nonHodgkind lymphoma , states he has had a cough since November, having increased weakness, having issues with his balance.  States his mother n law that lives with them had a positive home covid test last night.

## 2020-08-13 NOTE — H&P (Addendum)
History and Physical   [employer]. FIE:[id number] DOB: 04/28/1951 DOA: 08/13/2020  PCP: Baxter Hire, MD  Outpatient Specialists: Dr. Rogue Bussing, medical oncology and Dr. Juleen China Patient coming from:   I have personally briefly reviewed patient's old medical records in [place].  Chief Concern: Weakness  HPI: Mitchell Chang. is a 70 y.o. 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 He reports the weakness has been progressive since initiation of chemotherapy in June 2021.   He also endorses baseline cough.   He states the cough is worse now and his weakness is worse in the last 2 to 3 weeks. He endorses the cough is nonproductive.   He endorses tmax of 102.1 at home and he took tylenol. The temperature has been intermittent on and off in the last two weeks.  He denies nausea, vomiting, diarrhea, dysuria, hematuria. He endorses decreased appetite that is baseline since starting chemotherapy.   Vaccinations: three vaccinations and will get another booster in 3 weeks  ROS: Constitutional: no weight change, + fever ENT/Mouth: no sore throat, no rhinorrhea Eyes: no eye pain, no vision changes Cardiovascular: no chest pain, no dyspnea,  no edema, no palpitations Respiratory: no cough, no sputum, no wheezing Gastrointestinal: no nausea, - vomiting, no diarrhea, no constipation Genitourinary: no urinary incontinence, no dysuria, no hematuria Musculoskeletal: no arthralgias, no myalgias Skin: no skin lesions, no pruritus, Neuro: + weakness, no loss of consciousness, no  syncope Psych: no anxiety, no depression, + decrease appetite Heme/Lymph: no bruising, no bleeding  ED Course: Discussed with ED provider, patient requiring hospitalization due to weakness secondary to Covid infection in setting of immunocompromised patient.  Assessment/Plan  Active Problems:   Blindness of both eyes   Chronic kidney disease (CKD), stage III (moderate) (HCC)   Clinical depression   Weakness   AKI (acute kidney injury) ([place])   Weakness in setting of immunocompromised patient Sepsis has not been excluded COVID infection -Blood cultures x2 -Infectious disease specialist has been consulted -ID specialist recommends remdesivir however patient declines and on reevaluation patient has borderline renal injury therefore remdesivir was not initiated -Steroids initiated -Labs daily: CBC, CMP, fibrin, CRP -I-S, flutter valve  Acute kidney injury suspect prerenal secondary to poor p.o. intake -Status post 1 L normal saline bolus -Normal saline IVF at 125 cc/h for 8 hours -BMP in the a.m.  New moderate patchy tree-in-bud opacities in the basilar lower lobes. -CT read is compatible for nonspecific infectious or inflammatory bronchiolitis and possibly due to aspiration -ID consulted and recommended pulmonary consultation -I have consulted pulmonary via secure chat, Dr. Devona Konig  Leukopenia-differential was not ordered in the ED -CBC with differential stat -CBC in the a.m.  Follicular lymphoma grade 3-recurrent/relapsed on Ritux-Revlimid (10 mg 3 weeks on, 1 week off  Hypothyroid-Synthroid 175 mcg  Hypertension on enalapril 20 mg daily, metoprolol 100 mg daily, nifedipine 60 mg daily  Blindness-present on admission, would recommend that patient be allowed to have spouse frequent visitation -Fall precautions  Weakness on chronic debility-PT, OT, TOC ordered for possible home PT  Daily DVT prophylaxis on 81 mg aspirin  Chart reviewed.  DVT prophylaxis:  Enoxaparin Code Status: Full code Diet: Heart healthy/carb modified Family Communication: Updated spouse at bedside Disposition Plan: Pending clinical course Consults called: Infectious disease and pulmonology Admission status: Observation with telemetry to [place]  Past Medical History:  Diagnosis Date  . Cancer ([place])    non-hodgkins lymphoma  . Chronic kidney disease   . Diabetes mellitus without complication ([place])   . Hypertension    Past Surgical History:  Procedure Laterality Date  . COLONOSCOPY    . COLONOSCOPY WITH PROPOFOL N/A 05/10/2015   Procedure: COLONOSCOPY WITH PROPOFOL;  Surgeon: Hulen Luster, MD;  Location: [facility] ENDOSCOPY;  Service: Gastroenterology;  Laterality: N/A;  . EYE SURGERY    . NOSE SURGERY     Social History:  reports that he has quit smoking. He has never used smokeless tobacco. He reports previous alcohol use. He reports previous drug use.  Allergies  Allergen Reactions  . Sulfa Antibiotics Other (See Comments)    Patient states frequent and persistent urination.   No family history on file. Family history: Family history reviewed and not pertinent  Prior to Admission medications   Medication Sig Start Date End Date Taking? Authorizing Provider  aspirin EC 81 MG tablet Take 81 mg by mouth daily.     [provider]  clobetasol cream (TEMOVATE) 1.88 % Apply 1 application topically as needed (itching).     [provider]  doxepin (SINEQUAN) 50 MG capsule Take 50 mg by mouth at bedtime.  01/10/14   [provider]  enalapril (VASOTEC) 20 MG tablet TAKE ONE TABLET BY MOUTH EVERY DAY 11/17/14   [provider]  Ergocalciferol (VITAMIN D2) 2000 units TABS Take 1 tablet by mouth daily.    [provider]  insulin aspart (NOVOLOG) 100 UNIT/ML FlexPen Inject into the skin. 14 units at breakfast and 20 units at dinner    [provider]  ketoconazole (NIZORAL) 2 % shampoo Apply 1 application topically as  needed for irritation.    [provider]  lenalidomide (REVLIMID) 15 MG capsule TAKE 1 CAPSULE BY MOUTH EVERY DAY FOR 21 DAYS THEN OFF FOR 7 DAYS 07/23/20   Cammie Sickle, MD  levothyroxine (SYNTHROID) 175 MCG tablet Take 1 tablet (175 mcg total) by mouth daily before breakfast. 08/08/20   Cammie Sickle, MD  metoprolol (LOPRESSOR) 100 MG tablet Take 100 mg by mouth daily.  11/24/13   [provider]  Multiple Vitamin (MULTIVITAMIN WITH MINERALS) TABS tablet Take 1 tablet by mouth daily.    [provider]  NIFEdipine (PROCARDIA XL/ADALAT-CC) 60 MG 24 hr tablet Take 60 mg by mouth daily.     [provider]  omeprazole (PRILOSEC) 20 MG capsule Take 20 mg by mouth daily.  06/16/14   [provider]  prednisoLONE sodium phosphate (INFLAMASE FORTE) 1 % ophthalmic solution Place 1 % into both eyes daily as needed (eye irritation).     [provider]  sitaGLIPtin (JANUVIA) 50 MG tablet TAKE ONE TABLET BY MOUTH EVERY DAY 10/03/14   [provider]  TRESIBA FLEXTOUCH 200 UNIT/ML SOPN Inject 58 Units into the skin at bedtime.  01/24/18   [provider]   Physical Exam: Vitals:   08/13/20 4166 08/13/20 0745 08/13/20 1057 08/13/20 1322  BP: (!) 119/59  131/62 135/67  Pulse:  80 74 76  Resp: 18  16 16   Temp: 98.6 F (37 C)  99.1 F (37.3 C)   TempSrc: Oral  Oral  SpO2:  97% 96% 95%  Weight:  80.3 kg    Height:  5\' 7"  (1.702 m)     Constitutional: appears age-appropriate, NAD, calm, comfortable Eyes: PERRL, lids and conjunctivae normal ENMT: Mucous membranes are moist. Posterior pharynx clear of any exudate or lesions. Age-appropriate dentition.  Mild hearing loss Neck: normal, supple, no masses, no thyromegaly Respiratory: clear to auscultation bilaterally, no wheezing, no crackles. Normal respiratory effort. No accessory muscle use.  Cardiovascular: Regular rate and rhythm, no murmurs / rubs / gallops. No  extremity edema. 2+ pedal pulses. No carotid bruits.  Abdomen: no tenderness, no masses palpated, no hepatosplenomegaly. Bowel sounds positive.  Musculoskeletal: no clubbing / cyanosis. No joint deformity upper and lower extremities. Good ROM, no contractures, no atrophy. Normal muscle tone.  Skin: no rashes, lesions, ulcers. No induration Neurologic: Sensation intact. Strength 5/5 in all 4.  Psychiatric: Normal judgment and insight. Alert and oriented x 3. Normal mood.   EKG: independently reviewed, showing sinus rhythm with rate of 78, first-degree AV block, QTc 487  First-degree AV block present on previous EKG on 08/22/2014  Chest x-ray on Admission: I personally reviewed and I agree with radiologist reading as below.  DG Chest 2 View  Result Date: 08/13/2020 CLINICAL DATA:  Cough, non-Hodgkin's lymphoma EXAM: CHEST - 2 VIEW COMPARISON:  CT chest dated 11/22/2019 FINDINGS: Mild left basilar opacity, likely atelectasis. Lungs are otherwise clear. No pleural effusion or pneumothorax. The heart is normal in size. Visualized osseous structures are within normal limits. IMPRESSION: Mild left basilar opacity, likely atelectasis. Electronically Signed   By: Julian Hy M.D.   On: 08/13/2020 08:20   CT Chest Wo Contrast  Result Date: 08/13/2020 CLINICAL DATA:  Non-Hodgkin's lymphoma with ongoing chemotherapy. Persistent cough. EXAM: CT CHEST WITHOUT CONTRAST TECHNIQUE: Multidetector CT imaging of the chest was performed following the standard protocol without IV contrast. COMPARISON:  06/12/2020 PET-CT.  11/22/2019 chest CT. FINDINGS: Cardiovascular: Normal heart size. No significant pericardial effusion/thickening. Three-vessel coronary atherosclerosis. Atherosclerotic nonaneurysmal thoracic aorta. Normal caliber pulmonary arteries. Mediastinum/Nodes: No discrete thyroid nodules. Unremarkable esophagus. No pathologically enlarged axillary lymph nodes. No pathologically enlarged mediastinal or  discrete hilar nodes on this noncontrast scan. A few top-normal size mediastinal nodes appear increased since 06/13/2019 PET-CT, for example 0.8 cm in the AP window (series 2/image 54), previously 0.5 cm, and 0.9 cm in the right subcarinal region (series 2/image 76), previously 0.6 cm. Lungs/Pleura: No pneumothorax. No pleural effusion. Moderate patchy ground-glass opacities throughout both lungs involving all lung lobes, new. Mild centrilobular and paraseptal emphysema with mild diffuse bronchial wall thickening, unchanged. No acute consolidative airspace disease, lung masses or significant pulmonary nodules. A few small curvilinear parenchymal bands in the basilar lower lobes bilaterally with associated moderate patchy tree-in-bud opacities in the basilar lower lobes, new. Upper abdomen: Moderate diffuse hepatic steatosis. Musculoskeletal: No aggressive appearing focal osseous lesions. Moderate thoracic spondylosis. IMPRESSION: 1. Moderate patchy ground-glass opacities throughout both lungs, new. Differential includes atypical pneumonia such as due to COVID-19 versus pulmonary drug toxicity. 2. New moderate patchy tree-in-bud opacities in the basilar lower lobes. Findings are compatible with nonspecific infectious or inflammatory bronchiolitis, such as due to aspiration. 3. No pathologically enlarged thoracic lymph nodes. Top-normal size mediastinal lymph nodes have increased from most recent comparison study of 06/12/2020 and warrant attention on short-term follow-up chest CT with IV contrast or follow-up PET-CT in 3 months. 4. Three-vessel coronary atherosclerosis. 5. Moderate diffuse hepatic steatosis. 6. Aortic Atherosclerosis (ICD10-I70.0) and Emphysema (ICD10-J43.9). Electronically Signed  By: Ilona Sorrel M.D.   On: 08/13/2020 12:32   Labs on Admission: I have personally reviewed following labs  CBC: Recent Labs  Lab 08/08/20 0821 08/13/20 0759  WBC 3.7* 2.2*  NEUTROABS 2.0  --   HGB 10.5* 10.4*   HCT 30.6* 30.0*  MCV 99.7 100.0  PLT 151 PLATELET CLUMPS NOTED ON SMEAR, UNABLE TO ESTIMATE   Basic Metabolic Panel: Recent Labs  Lab 08/08/20 0821 08/13/20 0759  NA 133* 132*  K 4.4 3.9  CL 101 101  CO2 23 20*  GLUCOSE 120* 128*  BUN 24* 23  CREATININE 2.17* 2.23*  CALCIUM 8.5* 8.5*   GFR: Estimated Creatinine Clearance: 30.9 mL/min (A) (by C-G formula based on SCr of 2.23 mg/dL (H)).  Liver Function Tests: Recent Labs  Lab 08/08/20 0821  AST 27  ALT 26  ALKPHOS 59  BILITOT 0.3  PROT 6.3*  ALBUMIN 3.4*   Ocean Schildt N Cambren Helm D.O. [facility]  If 7PM-7AM, please contact overnight-coverage provider If 7AM-7PM, please contact day coverage provider [email or web address]  08/13/2020, 10:11 PM

## 2020-08-13 NOTE — ED Notes (Signed)
Date and time results received: 08/13/20 5:48 PM  (use smartphrase ".now" to insert current time)  Test: COVID  Critical Value: Positive   Name of Provider Notified: Dr. Corky Downs  Orders Received? Or Actions Taken?: No new orders at this time

## 2020-08-13 NOTE — ED Notes (Signed)
Called lab for add on bloodwork
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
## 2020-08-13 NOTE — ED Notes (Signed)
PT IS BLIND

## 2020-08-13 NOTE — ED Notes (Signed)
Pt refused  Remdesivir.  Family with pt.

## 2020-08-13 NOTE — ED Notes (Signed)
Date and time results received: 08/13/20 12:27 PM  (use smartphrase ".now" to insert current time)  Test: COVID  Critical Value: Positive   Name of Provider Notified: Dr. Corky Downs   Orders Received? Or Actions Taken?: No new orders at this time

## 2020-08-14 DIAGNOSIS — J1282 Pneumonia due to coronavirus disease 2019: Secondary | ICD-10-CM

## 2020-08-14 DIAGNOSIS — T451X5A Adverse effect of antineoplastic and immunosuppressive drugs, initial encounter: Secondary | ICD-10-CM | POA: Diagnosis present

## 2020-08-14 DIAGNOSIS — Z79899 Other long term (current) drug therapy: Secondary | ICD-10-CM | POA: Diagnosis not present

## 2020-08-14 DIAGNOSIS — F32A Depression, unspecified: Secondary | ICD-10-CM | POA: Diagnosis present

## 2020-08-14 DIAGNOSIS — D631 Anemia in chronic kidney disease: Secondary | ICD-10-CM | POA: Diagnosis present

## 2020-08-14 DIAGNOSIS — Z923 Personal history of irradiation: Secondary | ICD-10-CM | POA: Diagnosis not present

## 2020-08-14 DIAGNOSIS — U071 COVID-19: Principal | ICD-10-CM | POA: Diagnosis present

## 2020-08-14 DIAGNOSIS — R531 Weakness: Secondary | ICD-10-CM | POA: Diagnosis present

## 2020-08-14 DIAGNOSIS — N1832 Chronic kidney disease, stage 3b: Secondary | ICD-10-CM | POA: Diagnosis present

## 2020-08-14 DIAGNOSIS — Z0184 Encounter for antibody response examination: Secondary | ICD-10-CM | POA: Diagnosis not present

## 2020-08-14 DIAGNOSIS — N183 Chronic kidney disease, stage 3 unspecified: Secondary | ICD-10-CM | POA: Diagnosis not present

## 2020-08-14 DIAGNOSIS — N179 Acute kidney failure, unspecified: Secondary | ICD-10-CM | POA: Diagnosis present

## 2020-08-14 DIAGNOSIS — E1122 Type 2 diabetes mellitus with diabetic chronic kidney disease: Secondary | ICD-10-CM | POA: Diagnosis present

## 2020-08-14 DIAGNOSIS — H548 Legal blindness, as defined in USA: Secondary | ICD-10-CM | POA: Diagnosis present

## 2020-08-14 DIAGNOSIS — I129 Hypertensive chronic kidney disease with stage 1 through stage 4 chronic kidney disease, or unspecified chronic kidney disease: Secondary | ICD-10-CM | POA: Diagnosis present

## 2020-08-14 DIAGNOSIS — E039 Hypothyroidism, unspecified: Secondary | ICD-10-CM | POA: Diagnosis present

## 2020-08-14 DIAGNOSIS — H543 Unqualified visual loss, both eyes: Secondary | ICD-10-CM | POA: Diagnosis not present

## 2020-08-14 DIAGNOSIS — D6959 Other secondary thrombocytopenia: Secondary | ICD-10-CM | POA: Diagnosis present

## 2020-08-14 DIAGNOSIS — Z87891 Personal history of nicotine dependence: Secondary | ICD-10-CM | POA: Diagnosis not present

## 2020-08-14 DIAGNOSIS — Z79890 Hormone replacement therapy (postmenopausal): Secondary | ICD-10-CM | POA: Diagnosis not present

## 2020-08-14 DIAGNOSIS — Z7982 Long term (current) use of aspirin: Secondary | ICD-10-CM | POA: Diagnosis not present

## 2020-08-14 DIAGNOSIS — Z794 Long term (current) use of insulin: Secondary | ICD-10-CM | POA: Diagnosis not present

## 2020-08-14 DIAGNOSIS — D701 Agranulocytosis secondary to cancer chemotherapy: Secondary | ICD-10-CM | POA: Diagnosis present

## 2020-08-14 DIAGNOSIS — C8223 Follicular lymphoma grade III, unspecified, intra-abdominal lymph nodes: Secondary | ICD-10-CM | POA: Diagnosis present

## 2020-08-14 DIAGNOSIS — D61810 Antineoplastic chemotherapy induced pancytopenia: Secondary | ICD-10-CM | POA: Diagnosis present

## 2020-08-14 LAB — CBC WITH DIFFERENTIAL/PLATELET
Abs Immature Granulocytes: 0.03 10*3/uL (ref 0.00–0.07)
Basophils Absolute: 0 10*3/uL (ref 0.0–0.1)
Basophils Relative: 0 %
Eosinophils Absolute: 0 10*3/uL (ref 0.0–0.5)
Eosinophils Relative: 0 %
HCT: 30.7 % — ABNORMAL LOW (ref 39.0–52.0)
Hemoglobin: 10.6 g/dL — ABNORMAL LOW (ref 13.0–17.0)
Immature Granulocytes: 1 %
Lymphocytes Relative: 9 %
Lymphs Abs: 0.2 10*3/uL — ABNORMAL LOW (ref 0.7–4.0)
MCH: 34.3 pg — ABNORMAL HIGH (ref 26.0–34.0)
MCHC: 34.5 g/dL (ref 30.0–36.0)
MCV: 99.4 fL (ref 80.0–100.0)
Monocytes Absolute: 0.1 10*3/uL (ref 0.1–1.0)
Monocytes Relative: 5 %
Neutro Abs: 2.1 10*3/uL (ref 1.7–7.7)
Neutrophils Relative %: 85 %
Platelets: 95 10*3/uL — ABNORMAL LOW (ref 150–400)
RBC: 3.09 MIL/uL — ABNORMAL LOW (ref 4.22–5.81)
RDW: 19 % — ABNORMAL HIGH (ref 11.5–15.5)
WBC: 2.4 10*3/uL — ABNORMAL LOW (ref 4.0–10.5)
nRBC: 0 % (ref 0.0–0.2)

## 2020-08-14 LAB — C-REACTIVE PROTEIN: CRP: 8.7 mg/dL — ABNORMAL HIGH (ref <1.0)

## 2020-08-14 LAB — COMPREHENSIVE METABOLIC PANEL
ALT: 26 U/L (ref 0–44)
AST: 34 U/L (ref 15–41)
Albumin: 3.2 g/dL — ABNORMAL LOW (ref 3.5–5.0)
Alkaline Phosphatase: 54 U/L (ref 38–126)
Anion gap: 11 (ref 5–15)
BUN: 26 mg/dL — ABNORMAL HIGH (ref 8–23)
CO2: 20 mmol/L — ABNORMAL LOW (ref 22–32)
Calcium: 8.6 mg/dL — ABNORMAL LOW (ref 8.9–10.3)
Chloride: 106 mmol/L (ref 98–111)
Creatinine, Ser: 1.9 mg/dL — ABNORMAL HIGH (ref 0.61–1.24)
GFR, Estimated: 37 mL/min — ABNORMAL LOW (ref >60)
Glucose, Bld: 147 mg/dL — ABNORMAL HIGH (ref 70–99)
Potassium: 4.1 mmol/L (ref 3.5–5.1)
Sodium: 137 mmol/L (ref 135–145)
Total Bilirubin: 0.6 mg/dL (ref 0.3–1.2)
Total Protein: 6.4 g/dL — ABNORMAL LOW (ref 6.5–8.1)

## 2020-08-14 LAB — GLUCOSE, CAPILLARY
Glucose-Capillary: 139 mg/dL — ABNORMAL HIGH (ref 70–99)
Glucose-Capillary: 221 mg/dL — ABNORMAL HIGH (ref 70–99)
Glucose-Capillary: 167 mg/dL — ABNORMAL HIGH (ref 70–99)
Glucose-Capillary: 177 mg/dL — ABNORMAL HIGH (ref 70–99)

## 2020-08-14 LAB — HIV ANTIBODY (ROUTINE TESTING W REFLEX): HIV Screen 4th Generation wRfx: NONREACTIVE

## 2020-08-14 LAB — FIBRIN DERIVATIVES D-DIMER (ARMC ONLY): Fibrin derivatives D-dimer (ARMC): 859.9 ng/mL (FEU) — ABNORMAL HIGH (ref 0.00–499.00)

## 2020-08-14 LAB — LACTIC ACID, PLASMA: Lactic Acid, Venous: 1.5 mmol/L (ref 0.5–1.9)

## 2020-08-14 MED ORDER — SODIUM CHLORIDE 0.9 % IV SOLN
200.0000 mg | Freq: Once | INTRAVENOUS | Status: DC
  Filled 2020-08-14: qty 40

## 2020-08-14 MED ORDER — SODIUM CHLORIDE 0.9 % IV SOLN
100.0000 mg | Freq: Every day | INTRAVENOUS | Status: DC
  Administered 2020-08-15 – 2020-08-16 (×2): 100 mg via INTRAVENOUS
  Filled 2020-08-14 (×2): qty 20

## 2020-08-14 MED ORDER — SODIUM CHLORIDE 0.9 % IV SOLN
100.0000 mg | Freq: Once | INTRAVENOUS | Status: AC
  Administered 2020-08-14: 100 mg via INTRAVENOUS

## 2020-08-14 MED ORDER — SODIUM CHLORIDE 0.9 % IV SOLN
100.0000 mg | Freq: Once | INTRAVENOUS | Status: AC
  Administered 2020-08-14: 100 mg via INTRAVENOUS
  Filled 2020-08-14: qty 20

## 2020-08-14 MED ORDER — SODIUM CHLORIDE 0.9 % IV SOLN: 100.0000 mg | Freq: Every day | INTRAVENOUS | Status: DC

## 2020-08-14 NOTE — TOC Initial Note (Signed)
Transition of Care (TOC) - Initial/Assessment Note    Patient Details  Name: Mitchell Chang. MRN: [id number] Date of Birth: 24-Dec-1950  Transition of Care [facility]) CM/SW Contact:    Candie Chroman, LCSW Phone Number: 08/14/2020, 1:17 PM  Clinical Narrative:  Patient on COVID isolation precautions. CSW called him in the room, introduced role, and explained that PT recommendations would be discussed. Patient is agreeable to home health PT and a walker. No agency preference. Will arrange closer to discharge. No further concerns. CSW encouraged patient to contact CSW as needed. CSW will continue to follow patient for support and facilitate return home when stable.                Expected Discharge Plan: [place] Barriers to Discharge: Continued Medical Work up   Patient Goals and CMS Choice        Expected Discharge Plan and Services Expected Discharge Plan: [place] Choice: [place] arrangements for the past 2 months: Single Family Home                                      Prior Living Arrangements/Services Living arrangements for the past 2 months: Single Family Home Lives with:: Spouse Patient language and need for interpreter reviewed:: Yes Do you feel safe going back to the place where you live?: Yes      Need for Family Participation in Patient Care: Yes (Comment) Care giver support system in place?: Yes (comment)   Criminal Activity/Legal Involvement Pertinent to Current Situation/Hospitalization: No - Comment as needed  Activities of Daily Living Home Assistive Devices/Equipment: Cane (specify quad or straight) ADL Screening (condition at time of admission) Patient's cognitive ability adequate to safely complete daily activities?: Yes Is the patient deaf or have difficulty hearing?: No Does the patient have difficulty seeing, even when wearing glasses/contacts?:  Yes Does the patient have difficulty concentrating, remembering, or making decisions?: No Patient able to express need for assistance with ADLs?: Yes Does the patient have difficulty dressing or bathing?: Yes Independently performs ADLs?: No Communication: Independent Dressing (OT): Independent Grooming: Independent Feeding: Independent Bathing: Independent Toileting: Independent In/Out Bed: Needs assistance Walks in Home: Independent Does the patient have difficulty walking or climbing stairs?: Yes Weakness of Legs: None Weakness of Arms/Hands: None  Permission Sought/Granted Permission sought to share information with : Facility Art therapist granted to share information with : Yes, Verbal Permission Granted     Permission granted to share info w AGENCY: Home health agencies        Emotional Assessment Appearance:: Appears stated age Attitude/Demeanor/Rapport: Engaged,Gracious Affect (typically observed): Accepting,Appropriate,Calm,Pleasant Orientation: : Oriented to Self,Oriented to Place,Oriented to  Time,Oriented to Situation Alcohol / Substance Use: Not Applicable Psych Involvement: No (comment)  Admission diagnosis:  Weakness [R53.1] Generalized weakness [R53.1] Pneumonia due to COVID-19 virus [U07.1, J12.82] Patient Active Problem List   Diagnosis Date Noted  . Weakness 08/13/2020  . AKI (acute kidney injury) ([place]) 08/13/2020  . Goals of care, counseling/discussion 11/27/2019  . Clinical depression 12/02/2015  . Controlled type 2 diabetes mellitus without complication ([place]) 65/99/3570  . Follicular lymphoma grade III of intra-abdominal lymph nodes ([place]) 06/13/2015  . Type 2 diabetes mellitus ([place]) 04/30/2015  . Blindness of both eyes 02/07/2015  . 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 BP (high blood  pressure) 02/07/2015  . Lymphoma, non-Hodgkin's ([place]) 02/07/2015  . Diabetes ([place]) 02/07/2015  . Chronic kidney disease (CKD), stage III (moderate)  ([place]) 12/09/2013  . Acid reflux 12/09/2013  . Psoriasis 12/09/2013   PCP:  Baxter Hire, MD Pharmacy:   [facility], [place] [address] Phone: [phone] Fax: [id number] [address], [place] [address] Phone: [phone] Fax: [facility], [place] [address] [place] [address] Phone: [phone] Fax: [phone]     Social Determinants of Health (SDOH) Interventions    Readmission Risk Interventions No flowsheet data found.

## 2020-08-14 NOTE — Care Management Obs Status (Signed)
[facility] NOTIFICATION   Patient Details  Name: Mitchell Chang. MRN: [id number] Date of Birth: 19-Dec-1950   Medicare Observation Status Notification Given:  Yes    Candie Chroman, LCSW 08/14/2020, 1:16 PM

## 2020-08-14 NOTE — Progress Notes (Signed)
ID Pt sitting in chair Wife in the room Pt says he is doing better No SOB Minimal cough No fever  Objective Patient Vitals for the past 24 hrs:  BP Temp Temp src Pulse Resp SpO2  08/14/20 1943 111/70 97.9 F (36.6 C) -- (!) 57 18 97 %  08/14/20 1722 (!) 147/75 98.4 F (36.9 C) Oral (!) 56 18 98 %  08/14/20 1010 (!) 155/71 -- -- -- -- --  08/14/20 0745 (!) 153/83 98.9 F (37.2 C) -- 78 20 95 %  08/14/20 0445 (!) 157/65 97.8 F (36.6 C) -- 91 18 97 %  08/13/20 2346 140/66 98.1 F (36.7 C) -- 73 18 94 %    Chest b/l air entry Minimal crepts base Hss1s2 Abd soft CNS nonfocal Blind both eyes  Labs CBC Latest Ref Rng & Units 08/14/2020 08/13/2020 08/08/2020  WBC 4.0 - 10.5 K/uL 2.4(L) 2.2(L) 3.7(L)  Hemoglobin 13.0 - 17.0 g/dL 10.6(L) 10.4(L) 10.5(L)  Hematocrit 39.0 - 52.0 % 30.7(L) 30.0(L) 30.6(L)  Platelets 150 - 400 K/uL 95(L) PLATELET CLUMPS NOTED ON SMEAR, UNABLE TO ESTIMATE 151    CMP Latest Ref Rng & Units 08/14/2020 08/13/2020 08/08/2020  Glucose 70 - 99 mg/dL 147(H) 128(H) 120(H)  BUN 8 - 23 mg/dL 26(H) 23 24(H)  Creatinine 0.61 - 1.24 mg/dL 1.90(H) 2.23(H) 2.17(H)  Sodium 135 - 145 mmol/L 137 132(L) 133(L)  Potassium 3.5 - 5.1 mmol/L 4.1 3.9 4.4  Chloride 98 - 111 mmol/L 106 101 101  CO2 22 - 32 mmol/L 20(L) 20(L) 23  Calcium 8.9 - 10.3 mg/dL 8.6(L) 8.5(L) 8.5(L)  Total Protein 6.5 - 8.1 g/dL 6.4(L) - 6.3(L)  Total Bilirubin 0.3 - 1.2 mg/dL 0.6 - 0.3  Alkaline Phos 38 - 126 U/L 54 - 59  AST 15 - 41 U/L 34 - 27  ALT 0 - 44 U/L 26 - 26   Impression/recommendation   70 year old male with history of follicular lymphoma on rituximab and Revlimid presented with fatigue of 1 month duration with cough and weakness.  Newly diagnosed COVID-19 infection.  CT value is 19 and 21 indicating acute infection.  Because he has been on rituximab which is a CD20 inhibitor likely has poor or no response to the vaccine.  He has taken 3 doses of mRNA vaccine.  Initially had refused  remdesivir but today he is agreed to take it in and got his first dose.  He may be a good candidate to get sertraline.  Discussed with the pharmacist and will arrange for it for tomorrow.  CKD with creatinine clearance of 32  Leukopenia secondary to chemo is improving.  Thrombocytopenia likely from the chemo as well Anemia  Infectious disease work-up for groundglass opacity in the CT ongoing.  Discussed the management with the patient, his wife and the hospitalist.

## 2020-08-14 NOTE — Evaluation (Signed)
Occupational Therapy Evaluation Patient Details Name: Mitchell Chang. MRN: [id number] DOB: 09-07-1950 Today's Date: 08/14/2020    History of Present Illness 70 y.o. male with history of non-Hodgkin's lymphoma, diabetes, hypertension who is on oral chemotherapy who sees Dr. Rogue Bussing for oncology who presents with complaints of severe weakness.  He also describes a cough that he has had for some time.  He is blind and is typically able to get around his house without difficulty and is quite independent, but in the days leading to admission he was too weak to walk from one side of the home to the other.  Pt found to be covid positive.   Clinical Impression   Pt was seen for OT evaluation this date. Prior to hospital admission, pt was generally modified independent up until the past couple weeks where pt endorses starting to feel weaker and noting impaired balance. Pt lives with his spouse in a 1 story home with walk in shower. Currently pt demonstrates impairments as described below (See OT problem list) which functionally limit his ability to perform ADL/self-care tasks. Pt currently requires verbal cues and increased time/effort to perform ADL tasks with slight drop on O2 sats with exertion (on room air, able to recover without supplemental O2). Pt educated in ECS including activity pacing, work simplification, home/routines modifications, AE/DME, pursed lip breathing, and fall sprevention strategies to support recovery and return to PLOF. Pt verbalized understanding and eager to participate in order to return home. Pt would benefit from skilled OT services to address noted impairments and functional limitations (see below for any additional details) in order to maximize safety and independence while minimizing falls risk and caregiver burden. Upon hospital discharge, do not currently anticipate need for skilled OT services upon return home.     Follow Up Recommendations  No OT follow up;Supervision  - Intermittent    Equipment Recommendations  None recommended by OT    Recommendations for Other Services       Precautions / Restrictions Precautions Precautions: Fall Restrictions Weight Bearing Restrictions: No      Mobility Bed Mobility               General bed mobility comments: deferred, up in recliner at start/end of session    Transfers Overall transfer level: Modified independent                    Balance Overall balance assessment: Needs assistance Sitting-balance support: No upper extremity supported Sitting balance-Leahy Scale: Normal     Standing balance support: Single extremity supported Standing balance-Leahy Scale: Fair                             ADL either performed or assessed with clinical judgement   ADL Overall ADL's : Needs assistance/impaired                                       General ADL Comments: supervision and VC PRN for safety 2/2 new environment and baseline visual deficits, increased time/effort to perform ADL tasks, with cues for incorporating ECS     Vision Baseline Vision/History: Legally blind Patient Visual Report: No change from baseline       Perception     Praxis      Pertinent Vitals/Pain Pain Assessment: No/denies pain     Hand Dominance  Extremity/Trunk Assessment Upper Extremity Assessment Upper Extremity Assessment: Generalized weakness;Overall [facility] for tasks assessed   Lower Extremity Assessment Lower Extremity Assessment: Overall WFL for tasks assessed;Generalized weakness   Cervical / Trunk Assessment Cervical / Trunk Assessment: Normal   Communication Communication Communication: No difficulties   Cognition Arousal/Alertness: Awake/alert Behavior During Therapy: WFL for tasks assessed/performed Overall Cognitive Status: Within Functional Limits for tasks assessed                                     General Comments        Exercises Other Exercises Other Exercises: Pt educated in ECS including activity pacing, work simplification, home/routines modifications, AE/DME, pursed lip breathing, and fall sprevention strategies to support recovery and return to PLOF.   Shoulder Instructions      Home Living Family/patient expects to be discharged to:: Private residence Living Arrangements: Spouse/significant other Available Help at Discharge: Family Type of Home: House Home Access: Stairs to enter [employer] of Steps: 2-4 Entrance Stairs-Rails:  (none at garage, rail at deck with 4 steps) Home Layout: One level     Bathroom Shower/Tub: [place] - single point;Shower seat - built in          Prior Functioning/Environment Level of Independence: Independent        Comments: Pt reports he is able to stay very active, despite being blind wife has to help with very little        OT Problem List: Decreased strength;Cardiopulmonary status limiting activity;Impaired balance (sitting and/or standing);Decreased activity tolerance;Decreased knowledge of use of DME or AE;Impaired vision/perception      OT Treatment/Interventions: Self-care/ADL training;Therapeutic exercise;Therapeutic activities;Energy conservation;DME and/or AE instruction;Patient/family education;Balance training    OT Goals(Current goals can be found in the care plan section) Acute Rehab OT Goals Patient Stated Goal: go home OT Goal Formulation: With patient Time For Goal Achievement: 08/28/20 Potential to Achieve Goals: Good ADL Goals Additional ADL Goal #1: Pt will perform UB/LB bathing and dressing with set up and VC as needed to compensate for visual deficits, while implementing learned ECS to support participation and minimize SOB. Additional ADL Goal #2: Pt will verbalize plan to implement at least 3 learned ECS to support recovery and return home. Additional ADL Goal #3: Pt will  demo independence with incentive spirometer and flutter valve as prescribed by MD to support recovery.  OT Frequency: Min 1X/week   Barriers to D/C:            Co-evaluation              AM-PAC OT "6 Clicks" Daily Activity     Outcome Measure Help from another person eating meals?: None Help from another person taking care of personal grooming?: None Help from another person toileting, which includes using toliet, bedpan, or urinal?: A Little Help from another person bathing (including washing, rinsing, drying)?: A Little Help from another person to put on and taking off regular upper body clothing?: None Help from another person to put on and taking off regular lower body clothing?: A Little 6 Click Score: 21   End of Session    Activity Tolerance: Patient tolerated treatment well Patient left: in chair;with call bell/phone within reach  OT Visit Diagnosis: Other abnormalities of gait and mobility (R26.89)  Time: 9872-1587 OT Time Calculation (min): 40 min Charges:  OT General Charges $OT Visit: 1 Visit OT Evaluation $OT Eval Moderate Complexity: 1 Mod OT Treatments $Self Care/Home Management : 23-37 mins  Jeni Salles, MPH, MS, OTR/L ascom [phone] 08/14/20, 2:23 PM
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
## 2020-08-14 NOTE — Evaluation (Signed)
Physical Therapy Evaluation Patient Details Name: Mitchell Chang. MRN: [id number] DOB: April 11, 1951 Today's Date: 08/14/2020   History of Present Illness  70 y.o. male with history of non-Hodgkin's lymphoma, diabetes, hypertension who is on oral chemotherapy who sees Dr. Rogue Bussing for oncology who presents with complaints of severe weakness.  He also describes a cough that he has had for some time.  He is blind and is typically able to get around his house without difficulty and is quite independent, but in the days leading to admission he was too weak to walk from one side of the home to the other.  Pt found to be covid positive.  Clinical Impression  Pt eager to work with PT and ultimately did relatively well.  He does not typically need AD but had some unsteadiness today that was clearly safer with HHA or walker.  Given that he is blind and needed constant directional assist single hand assistance seemed to work better in the room but going forward a walker may be a good idea in other settings, pt open to this idea.  He reports he is feeling stronger and more steady today than on arrival but is still not at all near his baseline.  Pt on room air on arrival with O2 in the high 90s and HR ~100, he did fatigue with modest activity with O2 down to 91% and HR >130 with 50 ft of ambulation.  Pt will not need rehab once ready for d/c, but would benefit from HHPT and per progress likely a FWW.    Follow Up Recommendations Home health PT;Supervision for mobility/OOB    Equipment Recommendations  Rolling walker with 5" wheels (per progress)    Recommendations for Other Services       Precautions / Restrictions Precautions Precautions: Fall Restrictions Weight Bearing Restrictions: No      Mobility  Bed Mobility Overal bed mobility: Modified Independent             General bed mobility comments: Pt needed some extra cuing 2/2 blindness, but did not need physical assist with getting  supine to sitting EOB    Transfers Overall transfer level: Modified independent               General transfer comment: again minimal cuing secondary to new settings, but able to rise and maintain balance w/ only suupervision.  Ambulation/Gait Ambulation/Gait assistance: Min guard Gait Distance (Feet): 50 Feet Assistive device: None;Rolling walker (2 wheeled);1 person hand held assist       General Gait Details: Used various strategies for in-room ambultion.  Needing constant verbal and/or tactile cues 2/2 blindness.  He had a few small stagger steps but no real LOBs.  He showed great willingness to push himself but did have HR rise from low 100s to >130 and O2 (on room air) drop from 98 to 91% with the modest effort. He does, however, report feeling much better today than yesterday regarding tolerance, stability, etc  Did trial a walker for ~15 ft and he clearly was more steady but given the tight quarters and need for constant directional guidance PT ended up offering HHA with success.  Stairs            Wheelchair Mobility    Modified Rankin (Stroke Patients Only)       Balance Overall balance assessment: Needs assistance Sitting-balance support: No upper extremity supported Sitting balance-Leahy Scale: Normal     Standing balance support: Single extremity supported Standing balance-Leahy Scale:  Fair Standing balance comment: Pt did have some stagger stepping/unsteadiness during ambulation with no overt LOBs.                             Pertinent Vitals/Pain Pain Assessment: No/denies pain    Home Living Family/patient expects to be discharged to:: Private residence Living Arrangements: Spouse/significant other Available Help at Discharge: Family   Home Access: Stairs to enter Entrance Stairs-Rails:  (none at garage, rail at deck with 4 steps) Entrance Stairs-Number of Steps: 2-4 Home Layout: One level Home Equipment: Violet - single point       Prior Function Level of Independence: Independent         Comments: Pt reports he is able to stay very active, despite being blind wife has to help with very little     Hand Dominance        Extremity/Trunk Assessment   Upper Extremity Assessment Upper Extremity Assessment: Overall WFL for tasks assessed;Generalized weakness    Lower Extremity Assessment Lower Extremity Assessment: Overall WFL for tasks assessed;Generalized weakness       Communication   Communication: No difficulties  Cognition Arousal/Alertness: Awake/alert Behavior During Therapy: WFL for tasks assessed/performed Overall Cognitive Status: Within Functional Limits for tasks assessed                                        General Comments      Exercises     Assessment/Plan    PT Assessment Patient needs continued PT services  PT Problem List Decreased strength;Decreased range of motion;Decreased activity tolerance;Decreased balance;Decreased mobility;Decreased knowledge of use of DME;Decreased safety awareness;Cardiopulmonary status limiting activity       PT Treatment Interventions DME instruction;Gait training;Stair training;Functional mobility training;Therapeutic activities;Therapeutic exercise;Balance training;Neuromuscular re-education;Patient/family education    PT Goals (Current goals can be found in the Care Plan section)  Acute Rehab PT Goals Patient Stated Goal: go home PT Goal Formulation: With patient Time For Goal Achievement: 08/28/20 Potential to Achieve Goals: Good    Frequency Min 2X/week   Barriers to discharge        Co-evaluation               AM-PAC PT "6 Clicks" Mobility  Outcome Measure Help needed turning from your back to your side while in a flat bed without using bedrails?: None Help needed moving from lying on your back to sitting on the side of a flat bed without using bedrails?: None Help needed moving to and from a bed to a  chair (including a wheelchair)?: None Help needed standing up from a chair using your arms (e.g., wheelchair or bedside chair)?: A Little Help needed to walk in hospital room?: A Little Help needed climbing 3-5 steps with a railing? : A Little 6 Click Score: 21    End of Session Equipment Utilized During Treatment: Gait belt Activity Tolerance: Patient limited by fatigue Patient left: with chair alarm set;with call bell/phone within reach;with nursing/sitter in room Nurse Communication: Mobility status PT Visit Diagnosis: Muscle weakness (generalized) (M62.81);Difficulty in walking, not elsewhere classified (R26.2);Unsteadiness on feet (R26.81)    Time: 2947-6546 PT Time Calculation (min) (ACUTE ONLY): 27 min   Charges:   PT Evaluation $PT Eval Low Complexity: 1 Low PT Treatments $Gait Training: 8-22 mins        Kreg Shropshire, DPT 08/14/2020, 10:23  AM

## 2020-08-14 NOTE — Progress Notes (Signed)
Remdesivir - Pharmacy Brief Note   O:  ALT: 26 CXR:  Moderate patchy ground-glass opacities throughout both lungs, new. SpO2: 95% on RA   A/P:  Remdesivir 200 mg IVPB once followed by 100 mg IVPB daily x 4 days.   Chinita Greenland PharmD Clinical Pharmacist 08/14/2020

## 2020-08-14 NOTE — Progress Notes (Signed)
Mitchell Chang requesting to talk to the CN about being able to come to the floor with the pt. I discussed our most recent changes to our visitor policy for COVID positive patients and that her husband does not fall in any of those categories. Her husband is blind but is alert and oriented  and can speak for himself.  I agreed she could come to the floor and help him get settled in.  She was appreciative but stated she had already a call to someone higher up than me and it would dealt with in the morning.

## 2020-08-15 DIAGNOSIS — H543 Unqualified visual loss, both eyes: Secondary | ICD-10-CM | POA: Diagnosis not present

## 2020-08-15 DIAGNOSIS — J1282 Pneumonia due to coronavirus disease 2019: Secondary | ICD-10-CM | POA: Diagnosis not present

## 2020-08-15 DIAGNOSIS — R531 Weakness: Secondary | ICD-10-CM | POA: Diagnosis not present

## 2020-08-15 DIAGNOSIS — N179 Acute kidney failure, unspecified: Secondary | ICD-10-CM | POA: Diagnosis not present

## 2020-08-15 DIAGNOSIS — U071 COVID-19: Secondary | ICD-10-CM | POA: Diagnosis not present

## 2020-08-15 LAB — CBC WITH DIFFERENTIAL/PLATELET
Abs Immature Granulocytes: 0.03 10*3/uL (ref 0.00–0.07)
Basophils Absolute: 0 10*3/uL (ref 0.0–0.1)
Basophils Relative: 0 %
Eosinophils Absolute: 0 10*3/uL (ref 0.0–0.5)
Eosinophils Relative: 0 %
HCT: 31.1 % — ABNORMAL LOW (ref 39.0–52.0)
Hemoglobin: 10.9 g/dL — ABNORMAL LOW (ref 13.0–17.0)
Immature Granulocytes: 1 %
Lymphocytes Relative: 6 %
Lymphs Abs: 0.3 10*3/uL — ABNORMAL LOW (ref 0.7–4.0)
MCH: 34.7 pg — ABNORMAL HIGH (ref 26.0–34.0)
MCHC: 35 g/dL (ref 30.0–36.0)
MCV: 99 fL (ref 80.0–100.0)
Monocytes Absolute: 0.2 10*3/uL (ref 0.1–1.0)
Monocytes Relative: 5 %
Neutro Abs: 4.8 10*3/uL (ref 1.7–7.7)
Neutrophils Relative %: 88 %
Platelets: 106 10*3/uL — ABNORMAL LOW (ref 150–400)
RBC: 3.14 MIL/uL — ABNORMAL LOW (ref 4.22–5.81)
RDW: 18.6 % — ABNORMAL HIGH (ref 11.5–15.5)
WBC: 5.4 10*3/uL (ref 4.0–10.5)
nRBC: 0 % (ref 0.0–0.2)

## 2020-08-15 LAB — MISC LABCORP TEST (SEND OUT): Labcorp test code: 164090

## 2020-08-15 LAB — COMPREHENSIVE METABOLIC PANEL
ALT: 31 U/L (ref 0–44)
AST: 51 U/L — ABNORMAL HIGH (ref 15–41)
Albumin: 3.1 g/dL — ABNORMAL LOW (ref 3.5–5.0)
Alkaline Phosphatase: 54 U/L (ref 38–126)
Anion gap: 14 (ref 5–15)
BUN: 30 mg/dL — ABNORMAL HIGH (ref 8–23)
CO2: 19 mmol/L — ABNORMAL LOW (ref 22–32)
Calcium: 8.7 mg/dL — ABNORMAL LOW (ref 8.9–10.3)
Chloride: 105 mmol/L (ref 98–111)
Creatinine, Ser: 1.87 mg/dL — ABNORMAL HIGH (ref 0.61–1.24)
GFR, Estimated: 38 mL/min — ABNORMAL LOW (ref >60)
Glucose, Bld: 214 mg/dL — ABNORMAL HIGH (ref 70–99)
Potassium: 3.9 mmol/L (ref 3.5–5.1)
Sodium: 138 mmol/L (ref 135–145)
Total Bilirubin: 0.6 mg/dL (ref 0.3–1.2)
Total Protein: 6.3 g/dL — ABNORMAL LOW (ref 6.5–8.1)

## 2020-08-15 LAB — PROCALCITONIN: Procalcitonin: 0.16 ng/mL

## 2020-08-15 LAB — CMV DNA, QUANTITATIVE, PCR
CMV DNA Quant: NEGATIVE IU/mL
Log10 CMV Qn DNA Pl: UNDETERMINED log10 IU/mL

## 2020-08-15 LAB — IGG, IGA, IGM
IgA: 222 mg/dL (ref 61–437)
IgG (Immunoglobin G), Serum: 656 mg/dL (ref 603–1613)
IgM (Immunoglobulin M), Srm: 11 mg/dL — ABNORMAL LOW (ref 15–143)

## 2020-08-15 LAB — GLUCOSE, CAPILLARY
Glucose-Capillary: 181 mg/dL — ABNORMAL HIGH (ref 70–99)
Glucose-Capillary: 284 mg/dL — ABNORMAL HIGH (ref 70–99)
Glucose-Capillary: 176 mg/dL — ABNORMAL HIGH (ref 70–99)
Glucose-Capillary: 200 mg/dL — ABNORMAL HIGH (ref 70–99)

## 2020-08-15 LAB — CRYPTOCOCCUS ANTIGEN, SERUM: Cryptococcus Antigen, Serum: NEGATIVE

## 2020-08-15 LAB — C-REACTIVE PROTEIN: CRP: 4.1 mg/dL — ABNORMAL HIGH (ref <1.0)

## 2020-08-15 LAB — URINE CULTURE: Culture: NO GROWTH

## 2020-08-15 LAB — TOXOPLASMA ANTIBODIES- IGG AND  IGM
Toxoplasma Antibody- IgM: <3 AU/mL (ref 0.0–7.9)
Toxoplasma IgG Ratio: 3.4 IU/mL (ref 0.0–7.1)

## 2020-08-15 LAB — FIBRIN DERIVATIVES D-DIMER (ARMC ONLY): Fibrin derivatives D-dimer (ARMC): 817.08 ng/mL (FEU) — ABNORMAL HIGH (ref 0.00–499.00)

## 2020-08-15 MED ORDER — EPINEPHRINE 0.3 MG/0.3ML IJ SOAJ
0.3000 mg | Freq: Once | INTRAMUSCULAR | Status: DC | PRN
  Filled 2020-08-15 (×2): qty 0.3

## 2020-08-15 MED ORDER — DIPHENHYDRAMINE HCL 50 MG/ML IJ SOLN: 50.0000 mg | Freq: Once | INTRAMUSCULAR | Status: DC | PRN

## 2020-08-15 MED ORDER — FAMOTIDINE IN NACL 20-0.9 MG/50ML-% IV SOLN
20.0000 mg | Freq: Once | INTRAVENOUS | Status: DC | PRN
  Filled 2020-08-15: qty 50

## 2020-08-15 MED ORDER — ENOXAPARIN SODIUM 40 MG/0.4ML ~~LOC~~ SOLN
40.0000 mg | SUBCUTANEOUS | Status: DC
  Administered 2020-08-15: 40 mg via SUBCUTANEOUS
  Filled 2020-08-15: qty 0.4

## 2020-08-15 MED ORDER — SOTROVIMAB 500 MG/8ML IV SOLN
500.0000 mg | Freq: Once | INTRAVENOUS | Status: AC
  Administered 2020-08-15: 13:00:00 500 mg via INTRAVENOUS
  Filled 2020-08-15: qty 8

## 2020-08-15 MED ORDER — ALBUTEROL SULFATE HFA 108 (90 BASE) MCG/ACT IN AERS
2.0000 | INHALATION_SPRAY | Freq: Once | RESPIRATORY_TRACT | Status: DC | PRN
  Filled 2020-08-15: qty 6.7

## 2020-08-15 MED ORDER — ZOLPIDEM TARTRATE 5 MG PO TABS
5.0000 mg | ORAL_TABLET | Freq: Every evening | ORAL | Status: DC | PRN
  Administered 2020-08-15: 5 mg via ORAL
  Filled 2020-08-15: qty 1

## 2020-08-15 MED ORDER — METHYLPREDNISOLONE SODIUM SUCC 125 MG IJ SOLR: 125.0000 mg | Freq: Once | INTRAMUSCULAR | Status: DC | PRN

## 2020-08-15 MED ORDER — SODIUM CHLORIDE 0.9 % IV SOLN: INTRAVENOUS | Status: DC | PRN

## 2020-08-15 NOTE — Progress Notes (Signed)
Physical Therapy Treatment Patient Details Name: Mitchell Chang. MRN: [id number] DOB: 03/02/1951 Today's Date: 08/15/2020    History of Present Illness 70 y.o. male with history of non-Hodgkin's lymphoma, diabetes, hypertension who is on oral chemotherapy who sees Dr. Rogue Bussing for oncology who presents with complaints of severe weakness.  He also describes a cough that he has had for some time.  He is blind and is typically able to get around his house without difficulty and is quite independent, but in the days leading to admission he was too weak to walk from one side of the home to the other.  Pt found to be covid positive.    PT Comments    Pt with greatly increased ambulation tolerance and confidence today.  He was able to circumambulate the nurses station with an additional half lap on room air while maintaining O2 in the 90s nearly the entire time (brief drops into the high 80s), did not need any standing rest breaks and had much more consistent HR during the effort.  He still did do much better with walker and did not have any LOBs or overt safety issues using, still not confident without AD/UE support.  Pt was fatigued after the bout of prolonged ambulation but was very pleased he was able to do as much as he did and is eager to return home soon.    Follow Up Recommendations  Home health PT;Supervision for mobility/OOB     Equipment Recommendations  Rolling walker with 5" wheels    Recommendations for Other Services       Precautions / Restrictions Precautions Precautions: Fall Restrictions Weight Bearing Restrictions: No    Mobility  Bed Mobility               General bed mobility comments: deferred, up in recliner at start/end of session  Transfers Overall transfer level: Modified independent Equipment used: None             General transfer comment: minimal cuing secondary to settings/blindness, but able to rise and maintain balance w/ only  supervision.  Ambulation/Gait Ambulation/Gait assistance: Min guard Gait Distance (Feet): 300 Feet Assistive device: Rolling walker (2 wheeled)       General Gait Details: Pt able to take a few steps w/o AD but admits to feeling much better with it and therefore we did utilize t/o ambulation around the nurses' station.  Pt needed expected cuing (blind) but was able to maintain speed, etc w/o excessive cuing or overt safety concerns.  He did start to have some progressive fatigue but was able to maintain O2 in the 90s apart from only infrequently dropping to 88/89% on room air.  Great overall effort.   Stairs             Wheelchair Mobility    Modified Rankin (Stroke Patients Only)       Balance Overall balance assessment: Modified Independent Sitting-balance support: No upper extremity supported Sitting balance-Leahy Scale: Normal     Standing balance support: Bilateral upper extremity supported Standing balance-Leahy Scale: Fair Standing balance comment: no stagger stepping or LOBs, expected guidance using walker                            Cognition Arousal/Alertness: Awake/alert Behavior During Therapy: WFL for tasks assessed/performed Overall Cognitive Status: Within Functional Limits for tasks assessed  Exercises      General Comments        Pertinent Vitals/Pain Pain Assessment: No/denies pain    Home Living                      Prior Function            PT Goals (current goals can now be found in the care plan section) Progress towards PT goals: Progressing toward goals    Frequency    Min 2X/week      PT Plan Current plan remains appropriate    Co-evaluation              AM-PAC PT "6 Clicks" Mobility   Outcome Measure  Help needed turning from your back to your side while in a flat bed without using bedrails?: None Help needed moving from lying on your  back to sitting on the side of a flat bed without using bedrails?: None Help needed moving to and from a bed to a chair (including a wheelchair)?: None Help needed standing up from a chair using your arms (e.g., wheelchair or bedside chair)?: None Help needed to walk in hospital room?: A Little Help needed climbing 3-5 steps with a railing? : A Little 6 Click Score: 22    End of Session Equipment Utilized During Treatment: Gait belt Activity Tolerance: Patient tolerated treatment well Patient left: with chair alarm set;with call bell/phone within reach;with nursing/sitter in room Nurse Communication: Mobility status PT Visit Diagnosis: Muscle weakness (generalized) (M62.81);Difficulty in walking, not elsewhere classified (R26.2);Unsteadiness on feet (R26.81)     Time: 5670-1410 PT Time Calculation (min) (ACUTE ONLY): 25 min  Charges:  $Gait Training: 23-37 mins                     Kreg Shropshire, DPT  08/15/2020, 12:41 PM
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
## 2020-08-15 NOTE — Progress Notes (Signed)
Antibody infusion complete without complications. VSS.  Will continue to monitor for 1 hour per policy.

## 2020-08-15 NOTE — TOC Progression Note (Signed)
Transition of Care (TOC) - Progression Note    Patient Details  Name: Mitchell Chang. MRN: [id number] Date of Birth: 1951-03-02  Transition of Care [facility]) CM/SW [place], LCSW Phone Number: 08/15/2020, 4:06 PM  Clinical Narrative: Per MD note, likely discharge home tomorrow. Set up patient up with a home health RN and PT through [facility]. Patient's wife is aware and agreeable. Asked MD to enter home health orders and a DME order for a walker.    Expected Discharge Plan: [place] Barriers to Discharge: Continued Medical Work up  Expected Discharge Plan and Services Expected Discharge Plan: [place] Choice: [place] arrangements for the past 2 months: Single Family Home                                       Social Determinants of Health (SDOH) Interventions    Readmission Risk Interventions No flowsheet data found.

## 2020-08-15 NOTE — Progress Notes (Signed)
Date of Admission:  08/13/2020      ID: Mitchell Her Dolder Brooke Bonito. is a 70 y.o. male  Active Problems:   Blindness of both eyes   Chronic kidney disease (CKD), stage III (moderate) (HCC)   Clinical depression   Weakness   AKI (acute kidney injury) ([place])   Pneumonia due to COVID-19 virus    Subjective: Doing better No fever No sob Walked in the room Off oxygen  Medications:  . albuterol  2 puff Inhalation Q4H while awake  . vitamin C  500 mg Oral Daily  . aspirin EC  81 mg Oral Daily  . cholecalciferol  2,000 Units Oral Daily  . enoxaparin (LOVENOX) injection  40 mg Subcutaneous Q24H  . insulin aspart  0-5 Units Subcutaneous QHS  . insulin aspart  0-9 Units Subcutaneous TID WC  . levothyroxine  175 mcg Oral QAC breakfast  . linagliptin  5 mg Oral Daily  . methylPREDNISolone (SOLU-MEDROL) injection  1 mg/kg Intravenous Q12H   Followed by  . [START ON 08/16/2020] predniSONE  50 mg Oral Daily  . metoprolol tartrate  100 mg Oral Daily  . multivitamin with minerals  1 tablet Oral Daily  . NIFEdipine  60 mg Oral Daily  . pantoprazole  40 mg Oral Daily  . zinc sulfate  220 mg Oral Daily    Objective: Vital signs in last 24 hours: Temp:  [97.3 F (36.3 C)-98.6 F (37 C)] 97.7 F (36.5 C) (02/02 1157) Pulse Rate:  [56-87] 65 (02/02 1157) Resp:  [17-20] 17 (02/02 1157) BP: (111-157)/(62-75) 139/66 (02/02 1157) SpO2:  [93 %-98 %] 97 % (02/02 1157) Weight:  [77.5 kg] 77.5 kg (02/02 0500)  PHYSICAL EXAM:  General: Alert, cooperative, no distress, legally blind, sitting in chair Lungs:b/l air entry Heart: Regular rate and rhythm, no murmur, rub or gallop. Abdomen: Soft,  Extremities: atraumatic, no cyanosis. No edema. No clubbing Skin: No rashes or lesions. Or bruising Lymph: Cervical, supraclavicular normal. Neurologic: Grossly non-focal  Lab Results Recent Labs    08/14/20 0600 08/15/20 0601  WBC 2.4* 5.4  HGB 10.6* 10.9*  HCT 30.7* 31.1*  NA 137 138  K 4.1 3.9   CL 106 105  CO2 20* 19*  BUN 26* 30*  CREATININE 1.90* 1.87*   Liver Panel Recent Labs    08/14/20 0600 08/15/20 0601  PROT 6.4* 6.3*  ALBUMIN 3.2* 3.1*  AST 34 51*  ALT 26 31  ALKPHOS 54 54  BILITOT 0.6 0.6   Sedimentation Rate No results for input(s): ESRSEDRATE in the last 72 hours. 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  He was diagnosed with COVID-19 infection history.  He is to play vaccinated.  Because he has been on rituximab which is a CD20 inhibitor likely has poor response or rather no response to the vaccine.  Initially had refused remdesivir but agreed yesterday and has taken a dose of remdesivir.  Today he got sotrovimab a monoclonal antibody. He is doing well.  He is off oxygen.  He may not need more than 3 days of remdesivir.  He is very keen to go home.  This may be tomorrow after remdesivir he should be discharged home.  CKD stable  Leukopenia secondary to chemo is improved Thrombocytopenia has improved Discussed the management  with patient and his wife and the care team.  ID will sign off call if needed.

## 2020-08-15 NOTE — Progress Notes (Signed)
Anticoagulation monitoring(Lovenox):  69yo  M ordered Lovenox 30 mg Q24h  Filed Weights   08/13/20 0745 08/15/20 0500  Weight: 80.3 kg (177 lb) 77.5 kg (170 lb 13.7 oz)   BMI 26.7   Lab Results  Component Value Date   CREATININE 1.87 (H) 08/15/2020   CREATININE 1.90 (H) 08/14/2020   CREATININE 2.23 (H) 08/13/2020   Estimated Creatinine Clearance: 33.9 mL/min (A) (by C-G formula based on SCr of 1.87 mg/dL (H)). Hemoglobin & Hematocrit     Component Value Date/Time   HGB 10.9 (L) 08/15/2020 0601   HGB 13.9 07/31/2014 1531   HCT 31.1 (L) 08/15/2020 0601   HCT 42.2 07/31/2014 1531     Per Protocol for Patient with estCrcl now > 30 ml/min and BMI < 30, will transition to Lovenox 40 mg Q24h     Chinita Greenland PharmD Clinical Pharmacist 08/15/2020

## 2020-08-15 NOTE — Progress Notes (Signed)
Occupational Therapy Treatment Patient Details Name: Mitchell Chang. MRN: [id number] DOB: 1950-12-18 Today's Date: 08/15/2020    History of present illness 70 y.o. male with history of non-Hodgkin's lymphoma, diabetes, hypertension who is on oral chemotherapy who sees Dr. Rogue Bussing for oncology who presents with complaints of severe weakness.  He also describes a cough that he has had for some time.  He is blind and is typically able to get around his house without difficulty and is quite independent, but in the days leading to admission he was too weak to walk from one side of the home to the other.  Pt found to be covid positive.   OT comments  Pt seen for OT treatment this date to f/u re: safety with ADLs/ADL mobility. OT engages pt in seated UB ADLs and LB dsg with SETUP and verbal cues only for locating items. Pt able to perform fxl mobility to nsg station and back with RW with 3 standing rest breaks. Pt noted to have increased WOB< but spO2 stable throughout on RA. Pt encouraged to pace and to quell his speaking when feeling SOB. Pt with good understanding. Left in chair with all needs in reach. RN notified of session. Will continue to follow acutely, but do not anticipate need for OT f/u upon d/c to home setting.    Follow Up Recommendations  No OT follow up;Supervision - Intermittent    Equipment Recommendations  None recommended by OT    Recommendations for Other Services      Precautions / Restrictions Precautions Precautions: Fall Restrictions Weight Bearing Restrictions: No       Mobility Bed Mobility Overal bed mobility: Modified Independent                Transfers Overall transfer level: Modified independent Equipment used: Rolling walker (2 wheeled)             General transfer comment: pt able to perform fxl mobility w/o RW, but does demo some sway and one instance of posterior LOB so benefits from UE support.    Balance Overall balance  assessment: Modified Independent Sitting-balance support: No upper extremity supported Sitting balance-Leahy Scale: Normal     Standing balance support: Bilateral upper extremity supported Standing balance-Leahy Scale: Fair Standing balance comment: benefits from UE support, no gross LOB, but does sway some and one small instance of posterior LOB with pt able to self-correct.                           ADL either performed or assessed with clinical judgement   ADL Overall ADL's : Needs assistance/impaired     Grooming: Set up;Sitting Grooming Details (indicate cue type and reason): verbal cues to locate items, pt otherwise able to complete tasks with no physical assist.             Lower Body Dressing: Set up;Sitting/lateral leans Lower Body Dressing Details (indicate cue type and reason): to don slip on shoes.             Functional mobility during ADLs: Supervision/safety;Min guard;Rolling walker (fxl mobility to/from nsg station, on RA. Pt does increased WOB with walking/talking, but tolerates well overall with spO2 sustaining >90% throughout. Requires 3 standing rest breaks and occ. VCs for pacing. MOD cues for negotiating)       Vision Baseline Vision/History: Legally blind Patient Visual Report: No change from baseline     Perception     Praxis  Cognition Arousal/Alertness: Awake/alert Behavior During Therapy: WFL for tasks assessed/performed Overall Cognitive Status: Within Functional Limits for tasks assessed                                          Exercises Other Exercises Other Exercises: OT engages pt in seated UB ADLs and LB dsg with SETUP and verbal cues only for locating items. Pt able to perform fxl mobility to nsg station and back with RW with 3 standing rest breaks. Pt noted to have increased WOB< but spO2 stable throughout on RA. Pt encouraged to pace and to quell his speaking when feeling SOB. Pt with good  understanding. Left in chair with all needs in reach. RN notified of session.   Shoulder Instructions       General Comments      Pertinent Vitals/ Pain       Pain Assessment: No/denies pain  Home Living                                          Prior Functioning/Environment              Frequency  Min 1X/week        Progress Toward Goals  OT Goals(current goals can now be found in the care plan section)  Progress towards OT goals: Progressing toward goals  Acute Rehab OT Goals Patient Stated Goal: go home OT Goal Formulation: With patient Time For Goal Achievement: 08/28/20 Potential to Achieve Goals: Good  Plan Discharge plan remains appropriate    Co-evaluation                 AM-PAC OT "6 Clicks" Daily Activity     Outcome Measure   Help from another person eating meals?: None Help from another person taking care of personal grooming?: None Help from another person toileting, which includes using toliet, bedpan, or urinal?: A Little Help from another person bathing (including washing, rinsing, drying)?: A Little Help from another person to put on and taking off regular upper body clothing?: None Help from another person to put on and taking off regular lower body clothing?: A Little 6 Click Score: 21    End of Session Equipment Utilized During Treatment: Rolling walker  OT Visit Diagnosis: Other abnormalities of gait and mobility (R26.89)   Activity Tolerance Patient tolerated treatment well   Patient Left in chair;with call bell/phone within reach   Nurse Communication Mobility status        Time: 9381-0175 OT Time Calculation (min): 38 min  Charges: OT General Charges $OT Visit: 1 Visit OT Treatments $Self Care/Home Management : 8-22 mins $Therapeutic Activity: 23-37 mins  Gerrianne Scale, MS, OTR/L ascom [phone] 08/15/20, 5:39 PM

## 2020-08-16 ENCOUNTER — Telehealth: Payer: Self-pay | Admitting: *Deleted

## 2020-08-16 DIAGNOSIS — C8223 Follicular lymphoma grade III, unspecified, intra-abdominal lymph nodes: Secondary | ICD-10-CM | POA: Diagnosis not present

## 2020-08-16 DIAGNOSIS — U071 COVID-19: Secondary | ICD-10-CM | POA: Diagnosis not present

## 2020-08-16 DIAGNOSIS — R531 Weakness: Secondary | ICD-10-CM | POA: Diagnosis not present

## 2020-08-16 DIAGNOSIS — N179 Acute kidney failure, unspecified: Secondary | ICD-10-CM | POA: Diagnosis not present

## 2020-08-16 LAB — CBC WITH DIFFERENTIAL/PLATELET
Abs Immature Granulocytes: 0.04 10*3/uL (ref 0.00–0.07)
Basophils Absolute: 0 10*3/uL (ref 0.0–0.1)
Basophils Relative: 0 %
Eosinophils Absolute: 0 10*3/uL (ref 0.0–0.5)
Eosinophils Relative: 0 %
HCT: 28.9 % — ABNORMAL LOW (ref 39.0–52.0)
Hemoglobin: 10 g/dL — ABNORMAL LOW (ref 13.0–17.0)
Immature Granulocytes: 1 %
Lymphocytes Relative: 2 %
Lymphs Abs: 0.2 10*3/uL — ABNORMAL LOW (ref 0.7–4.0)
MCH: 34.5 pg — ABNORMAL HIGH (ref 26.0–34.0)
MCHC: 34.6 g/dL (ref 30.0–36.0)
MCV: 99.7 fL (ref 80.0–100.0)
Monocytes Absolute: 0.4 10*3/uL (ref 0.1–1.0)
Monocytes Relative: 7 %
Neutro Abs: 6 10*3/uL (ref 1.7–7.7)
Neutrophils Relative %: 90 %
Platelets: 98 10*3/uL — ABNORMAL LOW (ref 150–400)
RBC: 2.9 MIL/uL — ABNORMAL LOW (ref 4.22–5.81)
RDW: 18.5 % — ABNORMAL HIGH (ref 11.5–15.5)
WBC: 6.6 10*3/uL (ref 4.0–10.5)
nRBC: 0 % (ref 0.0–0.2)

## 2020-08-16 LAB — COMPREHENSIVE METABOLIC PANEL
ALT: 43 U/L (ref 0–44)
AST: 52 U/L — ABNORMAL HIGH (ref 15–41)
Albumin: 2.9 g/dL — ABNORMAL LOW (ref 3.5–5.0)
Alkaline Phosphatase: 56 U/L (ref 38–126)
Anion gap: 12 (ref 5–15)
BUN: 42 mg/dL — ABNORMAL HIGH (ref 8–23)
CO2: 20 mmol/L — ABNORMAL LOW (ref 22–32)
Calcium: 8.6 mg/dL — ABNORMAL LOW (ref 8.9–10.3)
Chloride: 104 mmol/L (ref 98–111)
Creatinine, Ser: 1.86 mg/dL — ABNORMAL HIGH (ref 0.61–1.24)
GFR, Estimated: 38 mL/min — ABNORMAL LOW (ref >60)
Glucose, Bld: 253 mg/dL — ABNORMAL HIGH (ref 70–99)
Potassium: 4.1 mmol/L (ref 3.5–5.1)
Sodium: 136 mmol/L (ref 135–145)
Total Bilirubin: 0.8 mg/dL (ref 0.3–1.2)
Total Protein: 5.8 g/dL — ABNORMAL LOW (ref 6.5–8.1)

## 2020-08-16 LAB — FIBRIN DERIVATIVES D-DIMER (ARMC ONLY): Fibrin derivatives D-dimer (ARMC): 726.34 ng/mL (FEU) — ABNORMAL HIGH (ref 0.00–499.00)

## 2020-08-16 LAB — GLUCOSE, CAPILLARY
Glucose-Capillary: 205 mg/dL — ABNORMAL HIGH (ref 70–99)
Glucose-Capillary: 145 mg/dL — ABNORMAL HIGH (ref 70–99)

## 2020-08-16 LAB — C-REACTIVE PROTEIN: CRP: 3.8 mg/dL — ABNORMAL HIGH (ref <1.0)

## 2020-08-16 MED ORDER — PREDNISONE 20 MG PO TABS: 20.0000 mg | ORAL_TABLET | Freq: Every day | ORAL | 0 refills | Status: AC

## 2020-08-16 MED ORDER — LENALIDOMIDE 15 MG PO CAPS: ORAL_CAPSULE | ORAL | 0 refills | Status: DC

## 2020-08-16 NOTE — Discharge Instructions (Signed)
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
## 2020-08-16 NOTE — Telephone Encounter (Signed)
Patient has been discharged from hospital wife would like a phone call regarding his chemo medications.

## 2020-08-16 NOTE — Telephone Encounter (Signed)
Called wife back in regards to message below. Patient has been released from the hospital. While in the hospital he was told to stop his revlimid. Wife is wanting to know when patient needs start back on revlimid. Wife wants to know if he should be seen sooner than 2/23 for a follow up on condition. We can get a virtual visit scheduled if you agree. Please advise.

## 2020-08-17 LAB — MISC LABCORP TEST (SEND OUT): Labcorp test code: 164284

## 2020-08-17 NOTE — Telephone Encounter (Signed)
Spoke with patient's wife. Reassured patient's wife that Dr. Rogue Bussing would personally call she and her husband this afternoon. The plan will be to keep all apts as scheduled and to stay off the Revlimid until the patient comes back to the clinic. She thanked me for the return phone call and looks fwd to the call from Dr. Jacinto Reap.

## 2020-08-18 ENCOUNTER — Other Ambulatory Visit: Payer: Self-pay | Admitting: Internal Medicine

## 2020-08-18 ENCOUNTER — Telehealth: Payer: Self-pay | Admitting: Internal Medicine

## 2020-08-18 DIAGNOSIS — C8223 Follicular lymphoma grade III, unspecified, intra-abdominal lymph nodes: Secondary | ICD-10-CM

## 2020-08-18 LAB — CULTURE, BLOOD (ROUTINE X 2)
Culture: NO GROWTH
Culture: NO GROWTH
Special Requests: ADEQUATE
Special Requests: ADEQUATE

## 2020-08-18 LAB — FUNGITELL, SERUM: Fungitell Result: <31 pg/mL (ref <80)

## 2020-08-18 NOTE — Telephone Encounter (Signed)
On 2/04- I called nad spoke to pt's wife re: hospital discharge. Continue to hold off revlimid.   C- please schedule follow up on 4/11- MD; labs- cbc/cmp.  Keep apt on 2/23 as planned.   GB

## 2020-08-20 NOTE — Telephone Encounter (Signed)
Per Dr. Jacinto Reap - patient needs to be see on 2/11 with lab/md instead of 4/11

## 2020-08-22 ENCOUNTER — Ambulatory Visit: Payer: Medicare PPO | Admitting: Internal Medicine

## 2020-08-22 ENCOUNTER — Other Ambulatory Visit: Payer: Medicare PPO

## 2020-08-24 ENCOUNTER — Inpatient Hospital Stay: Payer: Medicare PPO | Attending: Internal Medicine

## 2020-08-24 ENCOUNTER — Encounter: Payer: Self-pay | Admitting: Internal Medicine

## 2020-08-24 ENCOUNTER — Inpatient Hospital Stay (HOSPITAL_BASED_OUTPATIENT_CLINIC_OR_DEPARTMENT_OTHER): Payer: Medicare PPO | Admitting: Internal Medicine

## 2020-08-24 ENCOUNTER — Other Ambulatory Visit: Payer: Self-pay

## 2020-08-24 DIAGNOSIS — Z87891 Personal history of nicotine dependence: Secondary | ICD-10-CM | POA: Insufficient documentation

## 2020-08-24 DIAGNOSIS — I129 Hypertensive chronic kidney disease with stage 1 through stage 4 chronic kidney disease, or unspecified chronic kidney disease: Secondary | ICD-10-CM | POA: Diagnosis not present

## 2020-08-24 DIAGNOSIS — N183 Chronic kidney disease, stage 3 unspecified: Secondary | ICD-10-CM | POA: Insufficient documentation

## 2020-08-24 DIAGNOSIS — Z794 Long term (current) use of insulin: Secondary | ICD-10-CM | POA: Insufficient documentation

## 2020-08-24 DIAGNOSIS — C8223 Follicular lymphoma grade III, unspecified, intra-abdominal lymph nodes: Secondary | ICD-10-CM | POA: Diagnosis present

## 2020-08-24 DIAGNOSIS — E039 Hypothyroidism, unspecified: Secondary | ICD-10-CM | POA: Diagnosis not present

## 2020-08-24 DIAGNOSIS — E1122 Type 2 diabetes mellitus with diabetic chronic kidney disease: Secondary | ICD-10-CM | POA: Insufficient documentation

## 2020-08-24 DIAGNOSIS — Z7982 Long term (current) use of aspirin: Secondary | ICD-10-CM | POA: Diagnosis not present

## 2020-08-24 DIAGNOSIS — Z79899 Other long term (current) drug therapy: Secondary | ICD-10-CM | POA: Insufficient documentation

## 2020-08-24 DIAGNOSIS — Z8616 Personal history of COVID-19: Secondary | ICD-10-CM | POA: Insufficient documentation

## 2020-08-24 DIAGNOSIS — E119 Type 2 diabetes mellitus without complications: Secondary | ICD-10-CM | POA: Insufficient documentation

## 2020-08-24 DIAGNOSIS — Z9221 Personal history of antineoplastic chemotherapy: Secondary | ICD-10-CM | POA: Insufficient documentation

## 2020-08-24 LAB — COMPREHENSIVE METABOLIC PANEL
ALT: 32 U/L (ref 0–44)
AST: 22 U/L (ref 15–41)
Albumin: 2.9 g/dL — ABNORMAL LOW (ref 3.5–5.0)
Alkaline Phosphatase: 71 U/L (ref 38–126)
Anion gap: 10 (ref 5–15)
BUN: 24 mg/dL — ABNORMAL HIGH (ref 8–23)
CO2: 26 mmol/L (ref 22–32)
Calcium: 8.8 mg/dL — ABNORMAL LOW (ref 8.9–10.3)
Chloride: 103 mmol/L (ref 98–111)
Creatinine, Ser: 1.87 mg/dL — ABNORMAL HIGH (ref 0.61–1.24)
GFR, Estimated: 38 mL/min — ABNORMAL LOW (ref >60)
Glucose, Bld: 178 mg/dL — ABNORMAL HIGH (ref 70–99)
Potassium: 4.9 mmol/L (ref 3.5–5.1)
Sodium: 139 mmol/L (ref 135–145)
Total Bilirubin: 0.3 mg/dL (ref 0.3–1.2)
Total Protein: 6.1 g/dL — ABNORMAL LOW (ref 6.5–8.1)

## 2020-08-24 LAB — CBC WITH DIFFERENTIAL/PLATELET
Abs Immature Granulocytes: 0.02 10*3/uL (ref 0.00–0.07)
Basophils Absolute: 0.1 10*3/uL (ref 0.0–0.1)
Basophils Relative: 2 %
Eosinophils Absolute: 0.4 10*3/uL (ref 0.0–0.5)
Eosinophils Relative: 11 %
HCT: 27.8 % — ABNORMAL LOW (ref 39.0–52.0)
Hemoglobin: 9.5 g/dL — ABNORMAL LOW (ref 13.0–17.0)
Immature Granulocytes: 1 %
Lymphocytes Relative: 19 %
Lymphs Abs: 0.7 10*3/uL (ref 0.7–4.0)
MCH: 34.4 pg — ABNORMAL HIGH (ref 26.0–34.0)
MCHC: 34.2 g/dL (ref 30.0–36.0)
MCV: 100.7 fL — ABNORMAL HIGH (ref 80.0–100.0)
Monocytes Absolute: 0.3 10*3/uL (ref 0.1–1.0)
Monocytes Relative: 7 %
Neutro Abs: 2.2 10*3/uL (ref 1.7–7.7)
Neutrophils Relative %: 60 %
Platelets: 73 10*3/uL — ABNORMAL LOW (ref 150–400)
RBC: 2.76 MIL/uL — ABNORMAL LOW (ref 4.22–5.81)
RDW: 16.5 % — ABNORMAL HIGH (ref 11.5–15.5)
WBC: 3.6 10*3/uL — ABNORMAL LOW (ref 4.0–10.5)
nRBC: 0 % (ref 0.0–0.2)

## 2020-08-24 NOTE — Assessment & Plan Note (Addendum)
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    # s/p Rituxan- cycle 6 ;day-15 with revlimid; HOLD revlimid for now [recent COVID see below]-  #Mild leukopenia anemia thrombocytopenia-given recent Revlimid/Rituxan.  Monitor for now.  # BZMCE-02 pneumonia-needing hospitalization/soto; improved.  Left message for ID to discuss regarding timing of booster vaccination.  # Chronic mild Cough-/scratchy throat- hold off  ENT evaluation; off ACE-Inhibitor.   #Diabetes/brittle- -poorly controlled;  BG- 178-STABLE.     # Thyroiditis/HYPOTHYROIDISM-Synthroid to 175 mcg. Thyroid profile- JAN 2022- WNL-STABLE.  # CKD stage III-GFR -38; diabetes- [Dr.kolluru]; STABLE.   # Prophylaxis for DVTasprin 81 mg/day/revlimid.   # DISPOSITION: # keep appt on 2/23;  MD; labs- cbc/cmp;Dr.B
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
## 2020-09-05 ENCOUNTER — Inpatient Hospital Stay (HOSPITAL_BASED_OUTPATIENT_CLINIC_OR_DEPARTMENT_OTHER): Payer: Medicare PPO | Admitting: Internal Medicine

## 2020-09-05 ENCOUNTER — Encounter: Payer: Self-pay | Admitting: Internal Medicine

## 2020-09-05 ENCOUNTER — Other Ambulatory Visit: Payer: Self-pay

## 2020-09-05 ENCOUNTER — Inpatient Hospital Stay: Payer: Medicare PPO

## 2020-09-05 DIAGNOSIS — C8223 Follicular lymphoma grade III, unspecified, intra-abdominal lymph nodes: Secondary | ICD-10-CM

## 2020-09-05 LAB — CBC WITH DIFFERENTIAL/PLATELET
Abs Immature Granulocytes: 0 10*3/uL (ref 0.00–0.07)
Basophils Absolute: 0 10*3/uL (ref 0.0–0.1)
Basophils Relative: 0 %
Eosinophils Absolute: 0.2 10*3/uL (ref 0.0–0.5)
Eosinophils Relative: 10 %
HCT: 26.3 % — ABNORMAL LOW (ref 39.0–52.0)
Hemoglobin: 8.8 g/dL — ABNORMAL LOW (ref 13.0–17.0)
Immature Granulocytes: 0 %
Lymphocytes Relative: 25 %
Lymphs Abs: 0.6 10*3/uL — ABNORMAL LOW (ref 0.7–4.0)
MCH: 34.6 pg — ABNORMAL HIGH (ref 26.0–34.0)
MCHC: 33.5 g/dL (ref 30.0–36.0)
MCV: 103.5 fL — ABNORMAL HIGH (ref 80.0–100.0)
Monocytes Absolute: 0.2 10*3/uL (ref 0.1–1.0)
Monocytes Relative: 10 %
Neutro Abs: 1.2 10*3/uL — ABNORMAL LOW (ref 1.7–7.7)
Neutrophils Relative %: 55 %
Platelets: 43 10*3/uL — ABNORMAL LOW (ref 150–400)
RBC: 2.54 MIL/uL — ABNORMAL LOW (ref 4.22–5.81)
RDW: 16.1 % — ABNORMAL HIGH (ref 11.5–15.5)
WBC: 2.2 10*3/uL — ABNORMAL LOW (ref 4.0–10.5)
nRBC: 0 % (ref 0.0–0.2)

## 2020-09-05 LAB — COMPREHENSIVE METABOLIC PANEL
ALT: 14 U/L (ref 0–44)
AST: 15 U/L (ref 15–41)
Albumin: 3.3 g/dL — ABNORMAL LOW (ref 3.5–5.0)
Alkaline Phosphatase: 62 U/L (ref 38–126)
Anion gap: 10 (ref 5–15)
BUN: 23 mg/dL (ref 8–23)
CO2: 25 mmol/L (ref 22–32)
Calcium: 8.8 mg/dL — ABNORMAL LOW (ref 8.9–10.3)
Chloride: 103 mmol/L (ref 98–111)
Creatinine, Ser: 1.88 mg/dL — ABNORMAL HIGH (ref 0.61–1.24)
GFR, Estimated: 38 mL/min — ABNORMAL LOW (ref >60)
Glucose, Bld: 75 mg/dL (ref 70–99)
Potassium: 4.5 mmol/L (ref 3.5–5.1)
Sodium: 138 mmol/L (ref 135–145)
Total Bilirubin: 0.4 mg/dL (ref 0.3–1.2)
Total Protein: 6.5 g/dL (ref 6.5–8.1)
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
## 2020-09-05 NOTE — Assessment & Plan Note (Addendum)
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    # s/p Rituxan- cycle 6 ; HOLD revlimid for now [recent COVID/-pancytopenia]-we will plan to get a PET scan the next 1 month or so.  #Mild leukopenia-2.2; ANC 1.2; ; ANC-  anemia-8.5; thrombocytopenia-43 ? Etiology-question COVID. Less likely bone marrow infiltration for follicle lymphoma. Monitor for now.  # QMGNO-03 pneumonia-needing hospitalization/soto; improved. Await 3 months for booster.  # Chronic mild Cough-/scratchy throat- hold off  ENT evaluation; off ACE-Inhibitor as BP is stable. Marland Kitchen   #Diabetes/brittle- -poorly controlled;  BG- 178-STABLE.     # Thyroiditis/HYPOTHYROIDISM-Synthroid to 175 mcg. Thyroid profile- JAN 2022- WNL-STABLE.  # CKD stage III-GFR -38; diabetes- [Dr.kolluru]; STABLE.   # Prophylaxis for DVTasprin 81 mg/day/revlimid.   # DISPOSITION: # follow up in 2 weeks; MD; labs- cbc/cmp;Dr.B

## 2020-09-13 ENCOUNTER — Emergency Department: Payer: Medicare PPO

## 2020-09-13 ENCOUNTER — Encounter: Payer: Self-pay | Admitting: Internal Medicine

## 2020-09-13 ENCOUNTER — Other Ambulatory Visit: Payer: Self-pay

## 2020-09-13 ENCOUNTER — Inpatient Hospital Stay
Admission: EM | Admit: 2020-09-13 | Discharge: 2020-09-15 | DRG: 841 | Disposition: A | Discharge status: Home / Self Care | Payer: Medicare PPO | Attending: Internal Medicine | Admitting: Internal Medicine

## 2020-09-13 DIAGNOSIS — R531 Weakness: Secondary | ICD-10-CM | POA: Diagnosis present

## 2020-09-13 DIAGNOSIS — Z79899 Other long term (current) drug therapy: Secondary | ICD-10-CM

## 2020-09-13 DIAGNOSIS — D61818 Other pancytopenia: Secondary | ICD-10-CM

## 2020-09-13 DIAGNOSIS — D649 Anemia, unspecified: Principal | ICD-10-CM

## 2020-09-13 DIAGNOSIS — E0590 Thyrotoxicosis, unspecified without thyrotoxic crisis or storm: Secondary | ICD-10-CM | POA: Diagnosis present

## 2020-09-13 DIAGNOSIS — D709 Neutropenia, unspecified: Secondary | ICD-10-CM | POA: Diagnosis present

## 2020-09-13 DIAGNOSIS — Z794 Long term (current) use of insulin: Secondary | ICD-10-CM

## 2020-09-13 DIAGNOSIS — N183 Chronic kidney disease, stage 3 unspecified: Secondary | ICD-10-CM

## 2020-09-13 DIAGNOSIS — E1122 Type 2 diabetes mellitus with diabetic chronic kidney disease: Secondary | ICD-10-CM | POA: Diagnosis present

## 2020-09-13 DIAGNOSIS — R5081 Fever presenting with conditions classified elsewhere: Secondary | ICD-10-CM | POA: Diagnosis present

## 2020-09-13 DIAGNOSIS — Z882 Allergy status to sulfonamides status: Secondary | ICD-10-CM

## 2020-09-13 DIAGNOSIS — K219 Gastro-esophageal reflux disease without esophagitis: Secondary | ICD-10-CM

## 2020-09-13 DIAGNOSIS — Z7982 Long term (current) use of aspirin: Secondary | ICD-10-CM

## 2020-09-13 DIAGNOSIS — E119 Type 2 diabetes mellitus without complications: Secondary | ICD-10-CM

## 2020-09-13 DIAGNOSIS — I10 Essential (primary) hypertension: Secondary | ICD-10-CM

## 2020-09-13 DIAGNOSIS — Z8616 Personal history of COVID-19: Secondary | ICD-10-CM | POA: Diagnosis not present

## 2020-09-13 DIAGNOSIS — Z79890 Hormone replacement therapy (postmenopausal): Secondary | ICD-10-CM

## 2020-09-13 DIAGNOSIS — Z87891 Personal history of nicotine dependence: Secondary | ICD-10-CM

## 2020-09-13 DIAGNOSIS — E039 Hypothyroidism, unspecified: Secondary | ICD-10-CM | POA: Diagnosis present

## 2020-09-13 DIAGNOSIS — I129 Hypertensive chronic kidney disease with stage 1 through stage 4 chronic kidney disease, or unspecified chronic kidney disease: Secondary | ICD-10-CM | POA: Diagnosis present

## 2020-09-13 DIAGNOSIS — C8243 Follicular lymphoma grade IIIb, intra-abdominal lymph nodes: Principal | ICD-10-CM | POA: Diagnosis present

## 2020-09-13 DIAGNOSIS — N1832 Chronic kidney disease, stage 3b: Secondary | ICD-10-CM | POA: Diagnosis present

## 2020-09-13 LAB — COMPREHENSIVE METABOLIC PANEL
ALT: 26 U/L (ref 0–44)
AST: 31 U/L (ref 15–41)
Albumin: 3 g/dL — ABNORMAL LOW (ref 3.5–5.0)
Alkaline Phosphatase: 65 U/L (ref 38–126)
Anion gap: 11 (ref 5–15)
BUN: 23 mg/dL (ref 8–23)
CO2: 23 mmol/L (ref 22–32)
Calcium: 8.6 mg/dL — ABNORMAL LOW (ref 8.9–10.3)
Chloride: 101 mmol/L (ref 98–111)
Creatinine, Ser: 1.92 mg/dL — ABNORMAL HIGH (ref 0.61–1.24)
GFR, Estimated: 37 mL/min — ABNORMAL LOW (ref >60)
Glucose, Bld: 170 mg/dL — ABNORMAL HIGH (ref 70–99)
Potassium: 4.1 mmol/L (ref 3.5–5.1)
Sodium: 135 mmol/L (ref 135–145)
Total Bilirubin: 0.4 mg/dL (ref 0.3–1.2)
Total Protein: 6.4 g/dL — ABNORMAL LOW (ref 6.5–8.1)

## 2020-09-13 LAB — BRAIN NATRIURETIC PEPTIDE: B Natriuretic Peptide: 84.3 pg/mL (ref 0.0–100.0)

## 2020-09-13 LAB — CBC WITH DIFFERENTIAL/PLATELET
Abs Immature Granulocytes: 0.01 10*3/uL (ref 0.00–0.07)
Basophils Absolute: 0 10*3/uL (ref 0.0–0.1)
Basophils Relative: 0 %
Eosinophils Absolute: 0 10*3/uL (ref 0.0–0.5)
Eosinophils Relative: 1 %
HCT: 22.9 % — ABNORMAL LOW (ref 39.0–52.0)
Hemoglobin: 7.7 g/dL — ABNORMAL LOW (ref 13.0–17.0)
Immature Granulocytes: 1 %
Lymphocytes Relative: 18 %
Lymphs Abs: 0.3 10*3/uL — ABNORMAL LOW (ref 0.7–4.0)
MCH: 34.5 pg — ABNORMAL HIGH (ref 26.0–34.0)
MCHC: 33.6 g/dL (ref 30.0–36.0)
MCV: 102.7 fL — ABNORMAL HIGH (ref 80.0–100.0)
Monocytes Absolute: 0.3 10*3/uL (ref 0.1–1.0)
Monocytes Relative: 14 %
Neutro Abs: 1.2 10*3/uL — ABNORMAL LOW (ref 1.7–7.7)
Neutrophils Relative %: 66 %
Platelets: 76 10*3/uL — ABNORMAL LOW (ref 150–400)
RBC: 2.23 MIL/uL — ABNORMAL LOW (ref 4.22–5.81)
RDW: 16.3 % — ABNORMAL HIGH (ref 11.5–15.5)
WBC: 1.8 10*3/uL — ABNORMAL LOW (ref 4.0–10.5)
nRBC: 0 % (ref 0.0–0.2)

## 2020-09-13 LAB — IRON AND TIBC
Iron: 33 ug/dL — ABNORMAL LOW (ref 45–182)
Saturation Ratios: 15 % — ABNORMAL LOW (ref 17.9–39.5)
TIBC: 218 ug/dL — ABNORMAL LOW (ref 250–450)
UIBC: 185 ug/dL

## 2020-09-13 LAB — MAGNESIUM
Magnesium: 2 mg/dL (ref 1.7–2.4)
Magnesium: 2 mg/dL (ref 1.7–2.4)

## 2020-09-13 LAB — URINALYSIS, COMPLETE (UACMP) WITH MICROSCOPIC
Bacteria, UA: NONE SEEN
Bilirubin Urine: NEGATIVE
Glucose, UA: NEGATIVE mg/dL
Hgb urine dipstick: NEGATIVE
Ketones, ur: NEGATIVE mg/dL
Leukocytes,Ua: NEGATIVE
Nitrite: NEGATIVE
Protein, ur: 30 mg/dL — AB
Specific Gravity, Urine: 1.013 (ref 1.005–1.030)
Squamous Epithelial / HPF: NONE SEEN (ref 0–5)
pH: 5 (ref 5.0–8.0)

## 2020-09-13 LAB — PREPARE RBC (CROSSMATCH)

## 2020-09-13 LAB — FOLATE: Folate: 37 ng/mL (ref >5.9)

## 2020-09-13 LAB — RETICULOCYTES
Immature Retic Fract: 11.1 % (ref 2.3–15.9)
RBC.: 2.52 MIL/uL — ABNORMAL LOW (ref 4.22–5.81)
Retic Count, Absolute: 24.7 10*3/uL (ref 19.0–186.0)
Retic Ct Pct: 1 % (ref 0.4–3.1)

## 2020-09-13 LAB — TROPONIN I (HIGH SENSITIVITY)
Troponin I (High Sensitivity): 9 ng/L (ref <18)
Troponin I (High Sensitivity): 8 ng/L (ref <18)

## 2020-09-13 LAB — PROCALCITONIN: Procalcitonin: 0.12 ng/mL

## 2020-09-13 LAB — GLUCOSE, CAPILLARY: Glucose-Capillary: 111 mg/dL — ABNORMAL HIGH (ref 70–99)

## 2020-09-13 LAB — FERRITIN: Ferritin: 855 ng/mL — ABNORMAL HIGH (ref 24–336)

## 2020-09-13 LAB — ABO/RH: ABO/RH(D): B POS

## 2020-09-13 MED ORDER — PANTOPRAZOLE SODIUM 40 MG PO TBEC
40.0000 mg | DELAYED_RELEASE_TABLET | Freq: Every day | ORAL | Status: DC
  Administered 2020-09-14 – 2020-09-15 (×2): 40 mg via ORAL
  Filled 2020-09-13 (×2): qty 1

## 2020-09-13 MED ORDER — ALBUTEROL SULFATE HFA 108 (90 BASE) MCG/ACT IN AERS
2.0000 | INHALATION_SPRAY | Freq: Four times a day (QID) | RESPIRATORY_TRACT | Status: DC | PRN
  Filled 2020-09-13: qty 6.7

## 2020-09-13 MED ORDER — INSULIN ASPART 100 UNIT/ML ~~LOC~~ SOLN: 0.0000 [IU] | Freq: Three times a day (TID) | SUBCUTANEOUS | Status: DC

## 2020-09-13 MED ORDER — INSULIN GLARGINE 100 UNIT/ML ~~LOC~~ SOLN
7.0000 [IU] | Freq: Every day | SUBCUTANEOUS | Status: DC
  Filled 2020-09-13 (×3): qty 0.07

## 2020-09-13 MED ORDER — ACETAMINOPHEN 650 MG RE SUPP: 650.0000 mg | Freq: Four times a day (QID) | RECTAL | Status: DC | PRN

## 2020-09-13 MED ORDER — DOXEPIN HCL 50 MG PO CAPS
50.0000 mg | ORAL_CAPSULE | Freq: Every day | ORAL | Status: DC
  Administered 2020-09-13 – 2020-09-14 (×2): 50 mg via ORAL
  Filled 2020-09-13 (×3): qty 1

## 2020-09-13 MED ORDER — SODIUM CHLORIDE 0.9 % IV SOLN
10.0000 mL/h | Freq: Once | INTRAVENOUS | Status: AC
  Administered 2020-09-13: 10 mL/h via INTRAVENOUS

## 2020-09-13 MED ORDER — ACETAMINOPHEN 325 MG PO TABS: 650.0000 mg | ORAL_TABLET | Freq: Four times a day (QID) | ORAL | Status: DC | PRN

## 2020-09-13 MED ORDER — LEVOTHYROXINE SODIUM 50 MCG PO TABS
175.0000 ug | ORAL_TABLET | Freq: Every day | ORAL | Status: DC
  Administered 2020-09-14: 175 ug via ORAL
  Filled 2020-09-13: qty 1

## 2020-09-13 NOTE — Assessment & Plan Note (Addendum)
#  70 year old male patient with a history of relapsed follicular lymphoma; brittle diabetes; chronic kidney disease stage IV is currently admitted to hospital for worsening fatigue/s/p fall  #Worsening pancytopenia- fatigue-progressive anemia hemoglobin 7.8 status post PRBC transfusion hemoglobin 8.3.  White   ount 1.5 ANC 0.8/platelets 70s-unclear etiology.  Status post bone marrow biopsy on 3/04.  Patient recommended to keep appointment as planned on March 9.  #Neutropenic fever-agree with infectious work/.  Antibiotics-up as per primary team.  Recommend adding Granix 480 mcg.   # Follicular lymphoma grade 3- Recurrent/relapased-on Len-Rituxa; PET Nov 30th,2021-response to therapy noted with improvement of the neck chest abdomen pelvis adenopathy; however single focus central small bowel mesentery-Deauville criteria 4-5.  Given the above cytopenias-concern for recurrent/progressive follicle lymphoma.   # CKD stage III-GFR -34;  [Dr.kolluru]-STABLE.   #The above plan of care was discussed with the patient and wife in detail.  Dr. Mike Gip is on call over the weekend-for any  questions or concerns.
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
## 2020-09-13 NOTE — Progress Notes (Signed)
Brief note regarding plan, with full H&P to follow:  70 year old male with history of non-Hodgkin's lymphoma who is admitted for further evaluation of progressive pancytopenia after presenting from home to [facility] ED complaining of generalized weakness.  Patient's outpatient oncologist, Dr. Rogue Bussing, has been consulted and expresses concern that patient's lymphoma may have transitioned into the bone marrow.  Consequently, Dr. Rogue Bussing has ordered bone marrow biopsy tomorrow morning (09/14/20).  In the meantime, will provide transfusion of one unit PRBC and monitor interval response of ensuing hemoglobin level.  Hemodynamically stable. NPO at [place].      Babs Bertin, DO Hospitalist

## 2020-09-13 NOTE — ED Notes (Signed)
Patient to CT at this time

## 2020-09-13 NOTE — ED Notes (Signed)
Blood consent signed by patient's wife.

## 2020-09-13 NOTE — ED Triage Notes (Signed)
Pt had covid 08/13/20 and covid pneumonia, Pt  also has follicular Non-Hodgkins Lymphoma since 1992. Pt having increasing weakness since this past Sunday. Pt fell this past Monday and hit back of his head. Denies LOC. Pt alert and oriented x 4. Pt usually does not need help ambulating, but is getting progressively weaker per wife. Pt is legally blind.
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
## 2020-09-13 NOTE — ED Notes (Signed)
Lab called to add test on to blood work drawn in triage.

## 2020-09-13 NOTE — ED Triage Notes (Signed)
Pt also states he fell on coccyx when he fell this past Monday, and it is still painful.
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
## 2020-09-14 ENCOUNTER — Inpatient Hospital Stay: Payer: Medicare PPO

## 2020-09-14 ENCOUNTER — Encounter: Payer: Self-pay | Admitting: Internal Medicine

## 2020-09-14 DIAGNOSIS — R5081 Fever presenting with conditions classified elsewhere: Secondary | ICD-10-CM

## 2020-09-14 DIAGNOSIS — R531 Weakness: Secondary | ICD-10-CM | POA: Diagnosis not present

## 2020-09-14 DIAGNOSIS — D709 Neutropenia, unspecified: Secondary | ICD-10-CM

## 2020-09-14 DIAGNOSIS — D61818 Other pancytopenia: Secondary | ICD-10-CM | POA: Diagnosis not present

## 2020-09-14 DIAGNOSIS — E039 Hypothyroidism, unspecified: Secondary | ICD-10-CM

## 2020-09-14 DIAGNOSIS — E1122 Type 2 diabetes mellitus with diabetic chronic kidney disease: Secondary | ICD-10-CM | POA: Diagnosis not present

## 2020-09-14 DIAGNOSIS — D649 Anemia, unspecified: Secondary | ICD-10-CM | POA: Diagnosis present

## 2020-09-14 LAB — TYPE AND SCREEN
ABO/RH(D): B POS
Antibody Screen: NEGATIVE
Unit division: 0

## 2020-09-14 LAB — CBC WITH DIFFERENTIAL/PLATELET
Abs Immature Granulocytes: 0.02 10*3/uL (ref 0.00–0.07)
Basophils Absolute: 0 10*3/uL (ref 0.0–0.1)
Basophils Relative: 0 %
Eosinophils Absolute: 0 10*3/uL (ref 0.0–0.5)
Eosinophils Relative: 1 %
HCT: 23.7 % — ABNORMAL LOW (ref 39.0–52.0)
Hemoglobin: 8.2 g/dL — ABNORMAL LOW (ref 13.0–17.0)
Immature Granulocytes: 1 %
Lymphocytes Relative: 27 %
Lymphs Abs: 0.4 10*3/uL — ABNORMAL LOW (ref 0.7–4.0)
MCH: 33.3 pg (ref 26.0–34.0)
MCHC: 34.6 g/dL (ref 30.0–36.0)
MCV: 96.3 fL (ref 80.0–100.0)
Monocytes Absolute: 0.3 10*3/uL (ref 0.1–1.0)
Monocytes Relative: 17 %
Neutro Abs: 0.8 10*3/uL — ABNORMAL LOW (ref 1.7–7.7)
Neutrophils Relative %: 54 %
Platelets: 77 10*3/uL — ABNORMAL LOW (ref 150–400)
RBC: 2.46 MIL/uL — ABNORMAL LOW (ref 4.22–5.81)
RDW: 20.7 % — ABNORMAL HIGH (ref 11.5–15.5)
Smear Review: NORMAL
WBC: 1.5 10*3/uL — ABNORMAL LOW (ref 4.0–10.5)
nRBC: 0 % (ref 0.0–0.2)

## 2020-09-14 LAB — URINALYSIS, COMPLETE (UACMP) WITH MICROSCOPIC
Bacteria, UA: NONE SEEN
Bilirubin Urine: NEGATIVE
Glucose, UA: NEGATIVE mg/dL
Ketones, ur: NEGATIVE mg/dL
Leukocytes,Ua: NEGATIVE
Nitrite: NEGATIVE
Protein, ur: 30 mg/dL — AB
Specific Gravity, Urine: 1.014 (ref 1.005–1.030)
Squamous Epithelial / HPF: NONE SEEN (ref 0–5)
pH: 5 (ref 5.0–8.0)

## 2020-09-14 LAB — GLUCOSE, CAPILLARY
Glucose-Capillary: 133 mg/dL — ABNORMAL HIGH (ref 70–99)
Glucose-Capillary: 132 mg/dL — ABNORMAL HIGH (ref 70–99)
Glucose-Capillary: 113 mg/dL — ABNORMAL HIGH (ref 70–99)
Glucose-Capillary: 104 mg/dL — ABNORMAL HIGH (ref 70–99)

## 2020-09-14 LAB — LACTATE DEHYDROGENASE: LDH: 162 U/L (ref 98–192)

## 2020-09-14 LAB — BPAM RBC
Blood Product Expiration Date: 202203092359
ISSUE DATE / TIME: 202203031647
Unit Type and Rh: 9500

## 2020-09-14 LAB — BASIC METABOLIC PANEL
Anion gap: 9 (ref 5–15)
BUN: 23 mg/dL (ref 8–23)
CO2: 24 mmol/L (ref 22–32)
Calcium: 8.1 mg/dL — ABNORMAL LOW (ref 8.9–10.3)
Chloride: 104 mmol/L (ref 98–111)
Creatinine, Ser: 1.77 mg/dL — ABNORMAL HIGH (ref 0.61–1.24)
GFR, Estimated: 41 mL/min — ABNORMAL LOW (ref >60)
Glucose, Bld: 131 mg/dL — ABNORMAL HIGH (ref 70–99)
Potassium: 3.9 mmol/L (ref 3.5–5.1)
Sodium: 137 mmol/L (ref 135–145)

## 2020-09-14 LAB — HEMOGLOBIN A1C
Hgb A1c MFr Bld: 6.3 % — ABNORMAL HIGH (ref 4.8–5.6)
Mean Plasma Glucose: 134.11 mg/dL

## 2020-09-14 LAB — PROTIME-INR
INR: 1.2 (ref 0.8–1.2)
Prothrombin Time: 14.4 seconds (ref 11.4–15.2)

## 2020-09-14 LAB — TSH: TSH: 0.033 u[IU]/mL — ABNORMAL LOW (ref 0.350–4.500)

## 2020-09-14 LAB — VITAMIN B12: Vitamin B-12: 284 pg/mL (ref 180–914)

## 2020-09-14 LAB — MAGNESIUM: Magnesium: 2.1 mg/dL (ref 1.7–2.4)

## 2020-09-14 LAB — PATHOLOGIST SMEAR REVIEW

## 2020-09-14 LAB — FOLATE: Folate: 35 ng/mL (ref >5.9)

## 2020-09-14 MED ORDER — FENTANYL CITRATE (PF) 100 MCG/2ML IJ SOLN
INTRAMUSCULAR | Status: AC | PRN
  Administered 2020-09-14: 50 ug via INTRAVENOUS

## 2020-09-14 MED ORDER — METOPROLOL SUCCINATE ER 25 MG PO TB24
25.0000 mg | ORAL_TABLET | Freq: Every day | ORAL | Status: DC
  Administered 2020-09-14 – 2020-09-15 (×2): 25 mg via ORAL
  Filled 2020-09-14 (×2): qty 1

## 2020-09-14 MED ORDER — MIDAZOLAM HCL 2 MG/2ML IJ SOLN
INTRAMUSCULAR | Status: AC | PRN
  Administered 2020-09-14: 1 mg via INTRAVENOUS

## 2020-09-14 MED ORDER — FENTANYL CITRATE (PF) 100 MCG/2ML IJ SOLN
INTRAMUSCULAR | Status: AC
  Filled 2020-09-14: qty 2

## 2020-09-14 MED ORDER — MIDAZOLAM HCL 2 MG/2ML IJ SOLN
INTRAMUSCULAR | Status: AC
  Filled 2020-09-14: qty 2

## 2020-09-14 MED ORDER — TBO-FILGRASTIM 480 MCG/0.8ML ~~LOC~~ SOSY
480.0000 ug | PREFILLED_SYRINGE | Freq: Every day | SUBCUTANEOUS | Status: DC
  Administered 2020-09-14: 480 ug via SUBCUTANEOUS
  Filled 2020-09-14 (×2): qty 0.8

## 2020-09-14 MED ORDER — SODIUM CHLORIDE 0.9 % IV SOLN
2.0000 g | Freq: Two times a day (BID) | INTRAVENOUS | Status: DC
  Administered 2020-09-14 – 2020-09-15 (×3): 2 g via INTRAVENOUS
  Filled 2020-09-14 (×4): qty 2

## 2020-09-14 MED ORDER — HEPARIN SOD (PORK) LOCK FLUSH 100 UNIT/ML IV SOLN
INTRAVENOUS | Status: AC
  Filled 2020-09-14: qty 5

## 2020-09-14 NOTE — Procedures (Signed)
Pre-procedure Diagnosis: Pancytopenia Post-procedure Diagnosis: Same  Technically successful CT guided bone marrow aspiration and biopsy of left iliac crest.   Complications: None Immediate EBL: None  Signed: Sandi Mariscal Pager: [phone] 09/14/2020, 8:59 AM
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
## 2020-09-14 NOTE — Evaluation (Signed)
Physical Therapy Evaluation Patient Details Name: Mitchell Chang. MRN: [id number] DOB: 1950-07-20 Today's Date: 09/14/2020   History of Present Illness  Pt is a 70 y/o M admitted on 09/13/20 with c/c of worsening fatigue/episode of fall. Pt found to be pancytopenic - pt admitted for PRBC transfusion & further work-up. Pt underwent CT guided bone marrow aspiration & biopsy of L iliac crest on 09/14/20. PMH: non-hodgkins lymphoma, CKD, DM, HTN  Clinical Impression  Pt seen for PT evaluation with wife present. 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 Pt endorses 1 fall in the past 6 months. Pt is able to ambulate 100 ft + 100 ft with PT or wife standing in front of him providing CGA while he holds to caregiver with BUE; pt does ambulate with decreased gait speed in this manner. Pt performs exercises as noted below and PT discussed HHPT f/u with pt & wife. Also instructed pt on inability to bathe until speaking with nurse. Will recommend HHPT f/u to focus on endurance training & high level balance.     Follow Up Recommendations Home health PT;Supervision for mobility/OOB    Equipment Recommendations  None recommended by PT    Recommendations for Other Services       Precautions / Restrictions Precautions Precautions: Fall Precaution Comments: legally blind Restrictions Weight Bearing Restrictions: No      Mobility  Bed Mobility Overal bed mobility: Modified Independent             General bed mobility comments: HOB elevated    Transfers Overall transfer level: Needs assistance   Transfers: Sit to/from Stand Sit to Stand: Min guard (2/2 impaired vision)            Ambulation/Gait Ambulation/Gait assistance: Min guard Gait Distance (Feet): 100 Feet (+ 100 ft) Assistive device:  (PT ambulating in front of pt with pt's BUE supported on therapist's shoulders, 2nd gait trial with wife in same manner) Gait  Pattern/deviations: Decreased step length - left;Decreased stride length;Decreased step length - right Gait velocity: decreased      Stairs            Wheelchair Mobility    Modified Rankin (Stroke Patients Only)       Balance Overall balance assessment: Needs assistance Sitting-balance support: No upper extremity supported;Feet supported Sitting balance-Leahy Scale: Good       Standing balance-Leahy Scale: Fair Standing balance comment: 2/2 impaired vision light UE support needed during gait                             Pertinent Vitals/Pain Pain Assessment: No/denies pain    Home Living Family/patient expects to be discharged to:: Private residence Living Arrangements: Spouse/significant other Available Help at Discharge: Family;Available 24 hours/day Type of Home: House Home Access: Stairs to enter   [employer] of Steps: 2 steps without rails at garage, 4 steps with rails at front door (pt primarily uses garage entrance) Home Layout: One level Home Equipment: [place] - single point;Shower seat - built in;Walker - 2 wheels      Prior Function Level of Independence: Independent         Comments: Independent in home, holds to wife when ambulating out in the community     Hand Dominance        Extremity/Trunk Assessment   Upper Extremity Assessment Upper Extremity Assessment:  Overall [facility] for tasks assessed    Lower Extremity Assessment Lower Extremity Assessment: Overall WFL for tasks assessed       Communication   Communication: No difficulties  Cognition Arousal/Alertness: Awake/alert Behavior During Therapy: WFL for tasks assessed/performed Overall Cognitive Status: Within Functional Limits for tasks assessed                                        General Comments General comments (skin integrity, edema, etc.): HR up to 115bpm after gait, pt does note some fatigue    Exercises General Exercises -  Lower Extremity Long Arc Quad: AROM;Right;Left;10 reps;Seated Hip Flexion/Marching: AROM;Right;Left;10 reps;Seated   Assessment/Plan    PT Assessment Patient needs continued PT services  PT Problem List Decreased strength;Decreased balance;Cardiopulmonary status limiting activity;Decreased activity tolerance       PT Treatment Interventions DME instruction;Functional mobility training;Balance training;Patient/family education;Neuromuscular re-education;Gait training;Stair training;Therapeutic exercise    PT Goals (Current goals can be found in the Care Plan section)  Acute Rehab PT Goals Patient Stated Goal: go home PT Goal Formulation: With patient Time For Goal Achievement: 09/28/20 Potential to Achieve Goals: Good    Frequency Min 2X/week   Barriers to discharge        Co-evaluation               AM-PAC PT "6 Clicks" Mobility  Outcome Measure Help needed turning from your back to your side while in a flat bed without using bedrails?: None   Help needed moving to and from a bed to a chair (including a wheelchair)?: A Little Help needed standing up from a chair using your arms (e.g., wheelchair or bedside chair)?: A Little Help needed to walk in hospital room?: A Little Help needed climbing 3-5 steps with a railing? : A Lot 6 Click Score: 15    End of Session   Activity Tolerance: Patient tolerated treatment well Patient left: in bed;with bed alarm set;with family/visitor present;with call bell/phone within reach Nurse Communication: Mobility status (pt requesting to bathe) PT Visit Diagnosis: Muscle weakness (generalized) (M62.81)    Time: 9735-3299 PT Time Calculation (min) (ACUTE ONLY): 22 min   Charges:   PT Evaluation $PT Eval Low Complexity: 1 Low PT Treatments $Therapeutic Activity: 8-22 mins        Lavone Nian, PT, DPT 09/14/20, 3:27 PM   Waunita Schooner 09/14/2020, 3:25 PM

## 2020-09-14 NOTE — Consult Note (Signed)
Pharmacy Antibiotic Note  Mitchell Chang. is a 70 y.o. male admitted on 09/13/2020 with febrile neutropenia.  Pharmacy has been consulted for cefepime dosing.  WBC 1.8 > 1.5  Tmax 100.3.   Plan: Cefepime 2g q12H.   Height: 5\' 7"  (170.2 cm) Weight: 74.8 kg (165 lb) IBW/kg (Calculated) : 66.1  Temp (24hrs), Avg:98.5 F (36.9 C), Min:97.9 F (36.6 C), Max:100.3 F (37.9 C)  Recent Labs  Lab 09/13/20 1056 09/14/20 0506  WBC 1.8* 1.5*  CREATININE 1.92* 1.77*    Estimated Creatinine Clearance: 35.8 mL/min (A) (by C-G formula based on SCr of 1.77 mg/dL (H)).    Allergies  Allergen Reactions  . Sulfa Antibiotics Other (See Comments)    Patient states frequent and persistent urination.    Antimicrobials this admission: 3/4 cefepime >>   Dose adjustments this admission: None  Microbiology results: 3/4 BCx: pending  Thank you for allowing pharmacy to be a part of this patient's care.  Oswald Hillock, PharmD, BCPS 09/14/2020 1:27 PM

## 2020-09-15 DIAGNOSIS — D61818 Other pancytopenia: Secondary | ICD-10-CM | POA: Diagnosis not present

## 2020-09-15 DIAGNOSIS — E1122 Type 2 diabetes mellitus with diabetic chronic kidney disease: Secondary | ICD-10-CM | POA: Diagnosis not present

## 2020-09-15 DIAGNOSIS — R531 Weakness: Secondary | ICD-10-CM | POA: Diagnosis not present

## 2020-09-15 DIAGNOSIS — D709 Neutropenia, unspecified: Secondary | ICD-10-CM | POA: Diagnosis not present

## 2020-09-15 LAB — CBC
HCT: 24 % — ABNORMAL LOW (ref 39.0–52.0)
Hemoglobin: 8.2 g/dL — ABNORMAL LOW (ref 13.0–17.0)
MCH: 33.3 pg (ref 26.0–34.0)
MCHC: 34.2 g/dL (ref 30.0–36.0)
MCV: 97.6 fL (ref 80.0–100.0)
Platelets: 79 10*3/uL — ABNORMAL LOW (ref 150–400)
RBC: 2.46 MIL/uL — ABNORMAL LOW (ref 4.22–5.81)
RDW: 19.8 % — ABNORMAL HIGH (ref 11.5–15.5)
WBC: 3.1 10*3/uL — ABNORMAL LOW (ref 4.0–10.5)
nRBC: 0 % (ref 0.0–0.2)

## 2020-09-15 LAB — BASIC METABOLIC PANEL
Anion gap: 9 (ref 5–15)
BUN: 24 mg/dL — ABNORMAL HIGH (ref 8–23)
CO2: 23 mmol/L (ref 22–32)
Calcium: 8.5 mg/dL — ABNORMAL LOW (ref 8.9–10.3)
Chloride: 105 mmol/L (ref 98–111)
Creatinine, Ser: 1.75 mg/dL — ABNORMAL HIGH (ref 0.61–1.24)
GFR, Estimated: 41 mL/min — ABNORMAL LOW (ref >60)
Glucose, Bld: 124 mg/dL — ABNORMAL HIGH (ref 70–99)
Potassium: 3.8 mmol/L (ref 3.5–5.1)
Sodium: 137 mmol/L (ref 135–145)

## 2020-09-15 LAB — GLUCOSE, CAPILLARY
Glucose-Capillary: 106 mg/dL — ABNORMAL HIGH (ref 70–99)
Glucose-Capillary: 119 mg/dL — ABNORMAL HIGH (ref 70–99)

## 2020-09-15 MED ORDER — LEVOFLOXACIN 250 MG PO TABS: 250.0000 mg | ORAL_TABLET | Freq: Every evening | ORAL | 0 refills | Status: DC

## 2020-09-15 MED ORDER — TRESIBA FLEXTOUCH 200 UNIT/ML ~~LOC~~ SOPN: 8.0000 [IU] | PEN_INJECTOR | Freq: Every day | SUBCUTANEOUS | 3 refills | Status: DC

## 2020-09-15 MED ORDER — LEVOTHYROXINE SODIUM 50 MCG PO TABS: 50.0000 ug | ORAL_TABLET | Freq: Every day | ORAL | 0 refills | Status: DC

## 2020-09-15 MED ORDER — INSULIN ASPART 100 UNIT/ML FLEXPEN: 4.0000 [IU] | PEN_INJECTOR | Freq: Three times a day (TID) | SUBCUTANEOUS | 11 refills | Status: DC

## 2020-09-15 MED ORDER — METOPROLOL SUCCINATE ER 50 MG PO TB24: 50.0000 mg | ORAL_TABLET | Freq: Every day | ORAL | 0 refills | Status: DC

## 2020-09-15 NOTE — Discharge Summary (Signed)
[facility] [place] NAME: Mitchell Chang    MR#:  [id number]  DATE OF BIRTH:  07-01-1951  DATE OF ADMISSION:  09/13/2020 ADMITTING PHYSICIAN: Rhetta Mura, DO  DATE OF DISCHARGE: 09/15/2020 12:45 PM  PRIMARY CARE PHYSICIAN: Baxter Hire, MD    ADMISSION DIAGNOSIS:  Weakness [R53.1] Pancytopenia ([place]) [D61.818] Symptomatic anemia [D64.9]  DISCHARGE DIAGNOSIS:  Principal Problem:   Pancytopenia ([place]) Active Problems:   CKD stage 3 due to type 2 diabetes mellitus (HCC)   Acid reflux   Essential hypertension   Type 2 diabetes mellitus (HCC)   Weakness   Symptomatic anemia   Neutropenic fever ([place])   Hypothyroidism   SECONDARY DIAGNOSIS:   Past Medical History:  Diagnosis Date  . Cancer ([place])    non-hodgkins lymphoma  . Chronic kidney disease   . Diabetes mellitus without complication ([place])   . Hypertension     HOSPITAL COURSE:   1.  Neutropenic fever.  Patient had a temperature of 100.3 after bone marrow biopsy.  Not sure if this fever was secondary to the procedure.  With his white blood cell count being low I did start empiric cefepime and will discharge home on short course of Levaquin.  Patient felt fine and did not have any symptoms.  Blood cultures negative for 1 day and urine analysis negative chest x-ray did not show any infiltrate. 2.  Pancytopenia.  Patient has a history of follicular lymphoma.  The patient had a bone marrow biopsy on 09/14/2020.  Results are still pending at this time.  Has a follow-up with Dr. Rogue Bussing on Wednesday to go over results.  Patient received 1 unit of packed red blood cells for hemoglobin of 7.7.  Hemoglobin stable at 8.2.  Platelets stable at 79 upon discharge.  White blood cell count was as low as 1.5 and came up to 3.1 upon disposition.  Patient was given a dose of Granix. 3.  Generalized weakness did well with PT 4.  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   The patient is on Tresiba insulin but I will have to decrease the dose down to 8 units nightly.  Will also decrease the dose of the short acting insulin. 5.  Essential hypertension.  Will decrease dose of metoprolol down to 50 mg extended release daily. 6.  Hypothyroidism unspecified.  TSH in the hyperthyroid range.  We will decrease Synthroid down to 50 mcg daily.  Recommend checking repeat TFTs in 6 weeks.  DISCHARGE CONDITIONS:   Satisfactory  CONSULTS OBTAINED:  Oncology  DRUG ALLERGIES:   Allergies  Allergen Reactions  . Sulfa Antibiotics Other (See Comments)    Patient states frequent and persistent urination.    DISCHARGE MEDICATIONS:   Allergies as of 09/15/2020      Reactions   Sulfa Antibiotics Other (See Comments)   Patient states frequent and persistent urination.      Medication List    STOP taking these medications   lenalidomide 15 MG capsule Commonly known as: Revlimid   metoprolol tartrate 100 MG tablet Commonly known as: LOPRESSOR   NIFEdipine 60 MG 24 hr tablet Commonly known as: PROCARDIA XL/NIFEDICAL XL     TAKE these medications   albuterol 108 (90 Base) MCG/ACT inhaler Commonly known as: VENTOLIN HFA Inhale 2 puffs into the lungs every 6 (six) hours as needed for wheezing.   aspirin EC 81 MG tablet Take 81 mg by mouth daily.  clobetasol cream 0.05 % Commonly known as: TEMOVATE Apply 1 application topically as needed (itching).   doxepin 50 MG capsule Commonly known as: SINEQUAN Take 50 mg by mouth at bedtime.   insulin aspart 100 UNIT/ML FlexPen Commonly known as: NOVOLOG Inject 4 Units into the skin 3 (three) times daily with meals. What changed:  how much to take when to take this additional instructions   ketoconazole 2 % shampoo Commonly known as: NIZORAL Apply 1 application topically as needed for irritation.   levofloxacin 250 MG tablet Commonly known as: Levaquin Take 1 tablet (250 mg total) by mouth every evening.    levothyroxine 50 MCG tablet Commonly known as: Synthroid Take 1 tablet (50 mcg total) by mouth daily. What changed:  medication strength how much to take when to take this   metoprolol succinate 50 MG 24 hr tablet Commonly known as: TOPROL-XL Take 1 tablet (50 mg total) by mouth daily. Start taking on: September 16, 2020   multivitamin with minerals Tabs tablet Take 1 tablet by mouth daily.   omeprazole 20 MG capsule Commonly known as: PRILOSEC Take 20 mg by mouth daily.   prednisoLONE sodium phosphate 1 % ophthalmic solution Commonly known as: INFLAMASE FORTE Place 1 % into both eyes daily as needed (eye irritation).   sitaGLIPtin 50 MG tablet Commonly known as: JANUVIA Take 50 mg by mouth daily.   Tyler Aas FlexTouch 200 UNIT/ML FlexTouch Pen Generic drug: insulin degludec Inject 8 Units into the skin at bedtime. What changed: how much to take   Vitamin D2 50 MCG (2000 UT) Tabs Take 1 tablet by mouth daily.        DISCHARGE INSTRUCTIONS:  Follow-up Dr. Rogue Bussing as scheduled follow-up PMD next week  If you experience worsening of your admission symptoms, develop shortness of breath, life threatening emergency, suicidal or homicidal thoughts you must seek medical attention immediately by calling 911 or calling your MD immediately  if symptoms less severe.  You Must read complete instructions/literature along with all the possible adverse reactions/side effects for all the Medicines you take and that have been prescribed to you. Take any new Medicines after you have completely understood and accept all the possible adverse reactions/side effects.   Please note  You were cared for by a hospitalist during your hospital stay. If you have any questions about your discharge medications or the care you received while you were in the hospital after you are discharged, you can call the unit and asked to speak with the hospitalist on call if the hospitalist that took care of you  is not available. Once you are discharged, your primary care physician will handle any further medical issues. Please note that NO REFILLS for any discharge medications will be authorized once you are discharged, as it is imperative that you return to your primary care physician (or establish a relationship with a primary care physician if you do not have one) for your aftercare needs so that they can reassess your need for medications and monitor your lab values.    Today   CHIEF COMPLAINT:   Chief Complaint  Patient presents with  . Weakness    HISTORY OF PRESENT ILLNESS:  Mitchell Chang  is a 70 y.o. male sent in with weakness and low blood counts.   VITAL SIGNS:  Blood pressure (!) 143/71, pulse 79, temperature 98.1 F (36.7 C), temperature source Oral, resp. rate 17, height 5' 7"  (1.702 m), weight 74.8 kg, SpO2 99 %.  I/O:  Intake/Output Summary (Last 24 hours) at 09/15/2020 1445 Last data filed at 09/15/2020 1035 Gross per 24 hour  Intake 360 ml  Output 600 ml  Net -240 ml    PHYSICAL EXAMINATION:  GENERAL:  70 y.o.-year-old patient lying in the bed with no acute distress.  HEENT: Head atraumatic, normocephalic. Oropharynx and nasopharynx clear.   LUNGS: Normal breath sounds bilaterally, no wheezing, rales,rhonchi or crepitation. No use of accessory muscles of respiration.  CARDIOVASCULAR: S1, S2 normal. No murmurs, rubs, or gallops.  ABDOMEN: Soft, non-tender, non-distended. Bowel sounds present. No organomegaly or mass.  EXTREMITIES: No pedal edema.  NEUROLOGIC: Cranial nerves II through XII are intact. Muscle strength 5/5 in all extremities. Sensation intact. Gait not checked.  PSYCHIATRIC: The patient is alert and oriented x 3.  SKIN: No obvious rash, lesion, or ulcer.   DATA REVIEW:   CBC Recent Labs  Lab 09/15/20 0405  WBC 3.1*  HGB 8.2*  HCT 24.0*  PLT 79*    Chemistries  Recent Labs  Lab 09/13/20 1056 09/13/20 1524 09/14/20 0506 09/15/20 0405   NA 135  --  137 137  K 4.1  --  3.9 3.8  CL 101  --  104 105  CO2 23  --  24 23  GLUCOSE 170*  --  131* 124*  BUN 23  --  23 24*  CREATININE 1.92*  --  1.77* 1.75*  CALCIUM 8.6*  --  8.1* 8.5*  MG 2.0   < > 2.1  --   AST 31  --   --   --   ALT 26  --   --   --   ALKPHOS 65  --   --   --   BILITOT 0.4  --   --   --    < > = values in this interval not displayed.     Microbiology Results  Results for orders placed or performed during the hospital encounter of 09/13/20  CULTURE, BLOOD (ROUTINE X 2) w Reflex to ID Panel     Status: None (Preliminary result)   Collection Time: 09/14/20 12:00 PM   Specimen: BLOOD  Result Value Ref Range Status   Specimen Description BLOOD BLOOD RIGHT HAND  Final   Special Requests   Final    BOTTLES DRAWN AEROBIC AND ANAEROBIC Blood Culture adequate volume   Culture   Final    NO GROWTH < 24 HOURS Performed at [facility], [address], [place], [place] [address]    Report Status PENDING  Incomplete  CULTURE, BLOOD (ROUTINE X 2) w Reflex to ID Panel     Status: None (Preliminary result)   Collection Time: 09/14/20 12:01 PM   Specimen: BLOOD  Result Value Ref Range Status   Specimen Description BLOOD BLOOD LEFT HAND  Final   Special Requests   Final    BOTTLES DRAWN AEROBIC AND ANAEROBIC Blood Culture adequate volume   Culture   Final    NO GROWTH < 24 HOURS Performed at [facility], [address] [place], [place] [address]    Report Status PENDING  Incomplete    RADIOLOGY:  CT BONE MARROW BIOPSY & ASPIRATION  Result Date: 09/14/2020 INDICATION: History of lymphoma, now with pancytopenia. Please perform CT-guided bone marrow biopsy for tissue diagnostic purposes. EXAM: CT-GUIDED BONE MARROW BIOPSY AND ASPIRATION MEDICATIONS: None ANESTHESIA/SEDATION: Fentanyl 50 mcg IV; Versed 1 mg IV Sedation Time: 10 Minutes; The patient was continuously monitored during the procedure by the  interventional radiology nurse under my  direct supervision. COMPLICATIONS: None immediate. PROCEDURE: Informed consent was obtained from the patient following an explanation of the procedure, risks, benefits and alternatives. The patient understands, agrees and consents for the procedure. All questions were addressed. A time out was performed prior to the initiation of the procedure. The patient was positioned prone and non-contrast localization CT was performed of the pelvis to demonstrate the iliac marrow spaces. The operative site was prepped and draped in the usual sterile fashion. Under sterile conditions and local anesthesia, a 22 gauge spinal needle was utilized for procedural planning. Next, an 11 gauge coaxial bone biopsy needle was advanced into the left iliac marrow space. Needle position was confirmed with CT imaging. Initially, a bone marrow aspiration was performed. Next, a bone marrow biopsy was obtained with the 11 gauge outer bone marrow device. The 11 gauge coaxial bone biopsy needle was re-advanced into a slightly different location within the left iliac marrow space, positioning was confirmed with CT imaging and an additional bone marrow biopsy was obtained. The needle was removed and superficial hemostasis was obtained with manual compression. A dressing was applied. The patient tolerated the procedure well without immediate post procedural complication. IMPRESSION: Successful CT guided left iliac bone marrow aspiration and core biopsy. Electronically Signed   By: Sandi Mariscal M.D.   On: 09/14/2020 12:09    Management plans discussed with the patient, family and they are in agreement.  CODE STATUS:     Code Status Orders  (From admission, onward)         Start     Ordered   09/13/20 1851  Full code  Continuous        09/13/20 1851        Code Status History    Date Active Date Inactive Code Status Order ID Comments User Context   08/13/2020 1504 08/16/2020 1750 Full Code [id number]  [facility] Cedar, DO ED   Advance Care  Planning Activity    Advance Directive Documentation   Flowsheet Row Most Recent Value  Type of Advance Directive Living will  Pre-existing out of facility DNR order (yellow form or pink MOST form) --  "MOST" Form in Place? --      TOTAL TIME TAKING CARE OF THIS PATIENT: 35 minutes.    Loletha Grayer M.D on 09/15/2020 at 2:45 PM  Between 7am to 6pm - Pager - [phone]  After 6pm go to [email or web address] - password [email or web address]  [facility]  CC: Primary care physician; Baxter Hire, MD

## 2020-09-16 LAB — URINE CULTURE: Culture: <10000 — AB

## 2020-09-16 LAB — CALCIUM, IONIZED: Calcium, Ionized, Serum: 4.7 mg/dL (ref 4.5–5.6)

## 2020-09-18 LAB — SURGICAL PATHOLOGY

## 2020-09-19 ENCOUNTER — Encounter: Payer: Self-pay | Admitting: Internal Medicine

## 2020-09-19 ENCOUNTER — Inpatient Hospital Stay: Payer: Medicare PPO | Attending: Internal Medicine

## 2020-09-19 ENCOUNTER — Other Ambulatory Visit: Payer: Self-pay

## 2020-09-19 ENCOUNTER — Inpatient Hospital Stay (HOSPITAL_BASED_OUTPATIENT_CLINIC_OR_DEPARTMENT_OTHER): Payer: Medicare PPO | Admitting: Internal Medicine

## 2020-09-19 VITALS — BP 133/70 | HR 71 | Temp 96.8°F | Resp 16 | Ht 67.0 in | Wt 160.8 lb

## 2020-09-19 DIAGNOSIS — Z79899 Other long term (current) drug therapy: Secondary | ICD-10-CM | POA: Insufficient documentation

## 2020-09-19 DIAGNOSIS — C8223 Follicular lymphoma grade III, unspecified, intra-abdominal lymph nodes: Secondary | ICD-10-CM

## 2020-09-19 DIAGNOSIS — I10 Essential (primary) hypertension: Secondary | ICD-10-CM | POA: Diagnosis not present

## 2020-09-19 DIAGNOSIS — Z794 Long term (current) use of insulin: Secondary | ICD-10-CM | POA: Diagnosis not present

## 2020-09-19 DIAGNOSIS — Z7982 Long term (current) use of aspirin: Secondary | ICD-10-CM | POA: Diagnosis not present

## 2020-09-19 DIAGNOSIS — D61818 Other pancytopenia: Secondary | ICD-10-CM | POA: Insufficient documentation

## 2020-09-19 DIAGNOSIS — R410 Disorientation, unspecified: Secondary | ICD-10-CM | POA: Insufficient documentation

## 2020-09-19 DIAGNOSIS — Z87891 Personal history of nicotine dependence: Secondary | ICD-10-CM | POA: Insufficient documentation

## 2020-09-19 DIAGNOSIS — Z8616 Personal history of COVID-19: Secondary | ICD-10-CM | POA: Insufficient documentation

## 2020-09-19 DIAGNOSIS — E039 Hypothyroidism, unspecified: Secondary | ICD-10-CM

## 2020-09-19 DIAGNOSIS — Z8701 Personal history of pneumonia (recurrent): Secondary | ICD-10-CM | POA: Diagnosis not present

## 2020-09-19 DIAGNOSIS — E119 Type 2 diabetes mellitus without complications: Secondary | ICD-10-CM | POA: Diagnosis not present

## 2020-09-19 LAB — CBC WITH DIFFERENTIAL/PLATELET
Abs Immature Granulocytes: 0.39 10*3/uL — ABNORMAL HIGH (ref 0.00–0.07)
Band Neutrophils: 4 %
Basophils Absolute: 0 10*3/uL (ref 0.0–0.1)
Basophils Relative: 0 %
Blasts: 0 %
Eosinophils Absolute: 0.1 10*3/uL (ref 0.0–0.5)
Eosinophils Relative: 2 %
HCT: 27.5 % — ABNORMAL LOW (ref 39.0–52.0)
Hemoglobin: 9.1 g/dL — ABNORMAL LOW (ref 13.0–17.0)
Lymphocytes Relative: 19 %
Lymphs Abs: 0.7 10*3/uL (ref 0.7–4.0)
MCH: 32.9 pg (ref 26.0–34.0)
MCHC: 33.1 g/dL (ref 30.0–36.0)
MCV: 99.3 fL (ref 80.0–100.0)
Metamyelocytes Relative: 4 %
Monocytes Absolute: 0.5 10*3/uL (ref 0.1–1.0)
Monocytes Relative: 12 %
Myelocytes: 6 %
Neutro Abs: 2.2 10*3/uL (ref 1.7–7.7)
Neutrophils Relative %: 53 %
Other: 0 %
Platelets: 123 10*3/uL — ABNORMAL LOW (ref 150–400)
Promyelocytes Relative: 0 %
RBC: 2.77 MIL/uL — ABNORMAL LOW (ref 4.22–5.81)
RDW: 18.6 % — ABNORMAL HIGH (ref 11.5–15.5)
Smear Review: DECREASED
WBC: 3.9 10*3/uL — ABNORMAL LOW (ref 4.0–10.5)
nRBC: 0 /100 WBC
nRBC: 0.5 % — ABNORMAL HIGH (ref 0.0–0.2)

## 2020-09-19 LAB — COMPREHENSIVE METABOLIC PANEL
ALT: 20 U/L (ref 0–44)
AST: 24 U/L (ref 15–41)
Albumin: 3.1 g/dL — ABNORMAL LOW (ref 3.5–5.0)
Alkaline Phosphatase: 63 U/L (ref 38–126)
Anion gap: 10 (ref 5–15)
BUN: 20 mg/dL (ref 8–23)
CO2: 26 mmol/L (ref 22–32)
Calcium: 8.9 mg/dL (ref 8.9–10.3)
Chloride: 104 mmol/L (ref 98–111)
Creatinine, Ser: 1.92 mg/dL — ABNORMAL HIGH (ref 0.61–1.24)
GFR, Estimated: 37 mL/min — ABNORMAL LOW (ref >60)
Glucose, Bld: 210 mg/dL — ABNORMAL HIGH (ref 70–99)
Potassium: 4.2 mmol/L (ref 3.5–5.1)
Sodium: 140 mmol/L (ref 135–145)
Total Bilirubin: 0.6 mg/dL (ref 0.3–1.2)
Total Protein: 6.6 g/dL (ref 6.5–8.1)

## 2020-09-19 LAB — CULTURE, BLOOD (ROUTINE X 2)
Culture: NO GROWTH
Culture: NO GROWTH
Special Requests: ADEQUATE
Special Requests: ADEQUATE

## 2020-09-19 LAB — METHYLMALONIC ACID, SERUM: Methylmalonic Acid, Quantitative: 307 nmol/L (ref 0–378)
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
## 2020-09-19 NOTE — Patient Instructions (Signed)
#  Take Synthroid 100 mcg once a day; call us with a number of pills.

## 2020-09-19 NOTE — Progress Notes (Signed)
Having R leg pain when standing too long. Starts from R hip down leg. Has gotten more frequent recently. Has not had revlimid since 08/13/20

## 2020-09-19 NOTE — Assessment & Plan Note (Addendum)
#  Follicular lymphoma grade 3- Recurrent/relapased-on Len-Rituxa; PET Nov 30th,2021-response to therapy noted with improvement of the neck chest abdomen pelvis adenopathy; however single focus central small bowel mesentery-Deauville criteria 4-5.  Patient status post Rituxan 6 cycles; hold Revlimid given pancytopenia.   #Moderate pancytopenia-negative ANC 0.6 [status post Granix in the hospital; march 2022]; severe anemia needing PRBC transfusion' moderate thrombocytopenia-bone marrow biopsy-negative for any acute process-except for mild hypocellularity-suspect from recent treatments.  Discussed regarding possible PRBC transfusion at next visit.  Continue to hold Revlimid. Recommend repeating a PET scan in a month.   # Neutropenic fever- Improved; on levaquin 250 mg/day; finishing 1-2 days.   #Diabetes/brittle- -poorly controlled;  BG- 178-STABLE.     # Thyroiditis/HYPOTHYROIDISM- however- TSH in march 0.03; recommend synthroid 141mc/day.   # CKD stage III-GFR -38; diabetes- [Dr.kolluru];  STABLE.    # Prophylaxis for DVTasprin 81 mg/day/revlimid.   # DISPOSITION: # follow up in 4 weeks; MD; labs- cbc/cmp;HOLD tube; possible 1 unit PRBC transfusion; Thyroidprfile LDH; PET prior-;Dr.B

## 2020-09-20 ENCOUNTER — Telehealth: Payer: Self-pay

## 2020-09-20 NOTE — Telephone Encounter (Signed)
Per Dr. Jacinto Reap, pt is to take .75 mcg along with .50 mcg of synthroid for a total of 1.25 mg daily. Wife is aware and expressed understanding. Is also aware that pt can have dental procedures if needed. Pt states she will inform us when pt gets low on the .50 mcg of synthroid since she only has 30 day supply.

## 2020-09-21 ENCOUNTER — Other Ambulatory Visit: Payer: Self-pay | Admitting: *Deleted

## 2020-09-21 DIAGNOSIS — C8223 Follicular lymphoma grade III, unspecified, intra-abdominal lymph nodes: Secondary | ICD-10-CM

## 2020-09-21 DIAGNOSIS — R29898 Other symptoms and signs involving the musculoskeletal system: Secondary | ICD-10-CM

## 2020-09-24 ENCOUNTER — Telehealth: Payer: Self-pay

## 2020-09-24 NOTE — Telephone Encounter (Signed)
Wife brought in a denial letter for pts admission to the hospital on 09/13/20. Wife wanted [place] to do an appeal to get this admission covered. I called wife to advise that she would need to contact insurance company or the [facility] billing department to speak with them in regards. I advised we could not do anything on our part at this time since we did not admit patient to the hospital. Pts wife expressed understanding. [facility] billing number has been sent through pts mychart.

## 2020-09-25 ENCOUNTER — Telehealth: Payer: Self-pay

## 2020-09-25 ENCOUNTER — Other Ambulatory Visit: Payer: Self-pay | Admitting: *Deleted

## 2020-09-25 ENCOUNTER — Ambulatory Visit
Admission: RE | Admit: 2020-09-25 | Discharge: 2020-09-25 | Disposition: A | Discharge status: Home / Self Care | Payer: Medicare PPO | Attending: Internal Medicine | Admitting: Internal Medicine

## 2020-09-25 ENCOUNTER — Ambulatory Visit
Admission: RE | Admit: 2020-09-25 | Discharge: 2020-09-25 | Disposition: A | Discharge status: Home / Self Care | Payer: Medicare PPO | Source: Ambulatory Visit | Attending: Internal Medicine | Admitting: Internal Medicine

## 2020-09-25 ENCOUNTER — Inpatient Hospital Stay (HOSPITAL_BASED_OUTPATIENT_CLINIC_OR_DEPARTMENT_OTHER): Payer: Medicare PPO | Admitting: Nurse Practitioner

## 2020-09-25 ENCOUNTER — Encounter: Payer: Self-pay | Admitting: Internal Medicine

## 2020-09-25 ENCOUNTER — Encounter (HOSPITAL_COMMUNITY): Payer: Self-pay | Admitting: Internal Medicine

## 2020-09-25 ENCOUNTER — Other Ambulatory Visit: Payer: Self-pay

## 2020-09-25 VITALS — BP 109/74 | HR 80 | Temp 98.4°F | Resp 18

## 2020-09-25 DIAGNOSIS — R053 Chronic cough: Secondary | ICD-10-CM | POA: Diagnosis not present

## 2020-09-25 DIAGNOSIS — R059 Cough, unspecified: Secondary | ICD-10-CM | POA: Insufficient documentation

## 2020-09-25 DIAGNOSIS — C8223 Follicular lymphoma grade III, unspecified, intra-abdominal lymph nodes: Secondary | ICD-10-CM | POA: Diagnosis not present

## 2020-09-25 DIAGNOSIS — U099 Post covid-19 condition, unspecified: Secondary | ICD-10-CM

## 2020-09-25 LAB — SURGICAL PATHOLOGY

## 2020-09-25 MED ORDER — PREDNISONE 10 MG PO TABS: 10.0000 mg | ORAL_TABLET | Freq: Every day | ORAL | 0 refills | Status: DC

## 2020-09-25 MED ORDER — HYDROCOD POLST-CPM POLST ER 10-8 MG/5ML PO SUER: 5.0000 mL | Freq: Every evening | ORAL | 0 refills | Status: DC | PRN
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
## 2020-09-25 NOTE — Telephone Encounter (Signed)
Called and spoke to patient's wife re: MyChart message concern with cough. Per Dr. Rogue Bussing, pt needs CXR and needs to be seen in symptom management clinic. Order for CXR placed. Vaughan Basta informed informed of this, and stated that they will be on their way ASAP. Also instructed to take patient to have CXR first before coming to clinic. She verbalized understanding.

## 2020-10-01 ENCOUNTER — Encounter: Payer: Self-pay | Admitting: Internal Medicine

## 2020-10-01 ENCOUNTER — Telehealth: Payer: Self-pay | Admitting: Internal Medicine

## 2020-10-01 NOTE — Telephone Encounter (Signed)
On 3/21-I spoke to patient's wife Vaughan Basta regarding patient's recent confusion.  Only new medication statin/Tussionex with hydrocodone.  Suspect latter medication; less clinical suspicion for stroke given the patient's high risk of stroke given his poorly controlled diabetes/CKD etc.  Recommend not take Tussionex; patient's wife will keep [place] informed of patient's symptoms if not improving the next couple of days.   Thanks GB

## 2020-10-04 ENCOUNTER — Other Ambulatory Visit: Payer: Self-pay

## 2020-10-04 ENCOUNTER — Inpatient Hospital Stay: Payer: Medicare PPO

## 2020-10-04 ENCOUNTER — Other Ambulatory Visit: Payer: Self-pay | Admitting: *Deleted

## 2020-10-04 ENCOUNTER — Inpatient Hospital Stay (HOSPITAL_BASED_OUTPATIENT_CLINIC_OR_DEPARTMENT_OTHER): Payer: Medicare PPO | Admitting: Nurse Practitioner

## 2020-10-04 ENCOUNTER — Encounter: Payer: Self-pay | Admitting: Nurse Practitioner

## 2020-10-04 ENCOUNTER — Encounter: Payer: Self-pay | Admitting: *Deleted

## 2020-10-04 ENCOUNTER — Encounter: Payer: Self-pay | Admitting: Internal Medicine

## 2020-10-04 VITALS — BP 179/88 | HR 77 | Temp 97.4°F | Resp 20 | Ht 67.0 in | Wt 160.0 lb

## 2020-10-04 DIAGNOSIS — C8223 Follicular lymphoma grade III, unspecified, intra-abdominal lymph nodes: Secondary | ICD-10-CM

## 2020-10-04 DIAGNOSIS — E039 Hypothyroidism, unspecified: Secondary | ICD-10-CM | POA: Diagnosis not present

## 2020-10-04 DIAGNOSIS — F05 Delirium due to known physiological condition: Secondary | ICD-10-CM

## 2020-10-04 DIAGNOSIS — R410 Disorientation, unspecified: Secondary | ICD-10-CM | POA: Diagnosis not present

## 2020-10-04 LAB — URINALYSIS, COMPLETE (UACMP) WITH MICROSCOPIC
Bacteria, UA: NONE SEEN
Bilirubin Urine: NEGATIVE
Glucose, UA: NEGATIVE mg/dL
Hgb urine dipstick: NEGATIVE
Ketones, ur: NEGATIVE mg/dL
Leukocytes,Ua: NEGATIVE
Nitrite: NEGATIVE
Protein, ur: NEGATIVE mg/dL
Specific Gravity, Urine: 1.008 (ref 1.005–1.030)
Squamous Epithelial / HPF: NONE SEEN (ref 0–5)
pH: 7 (ref 5.0–8.0)

## 2020-10-04 LAB — COMPREHENSIVE METABOLIC PANEL
ALT: 21 U/L (ref 0–44)
AST: 24 U/L (ref 15–41)
Albumin: 3.9 g/dL (ref 3.5–5.0)
Alkaline Phosphatase: 62 U/L (ref 38–126)
Anion gap: 10 (ref 5–15)
BUN: 21 mg/dL (ref 8–23)
CO2: 28 mmol/L (ref 22–32)
Calcium: 9.2 mg/dL (ref 8.9–10.3)
Chloride: 100 mmol/L (ref 98–111)
Creatinine, Ser: 1.64 mg/dL — ABNORMAL HIGH (ref 0.61–1.24)
GFR, Estimated: 44 mL/min — ABNORMAL LOW (ref >60)
Glucose, Bld: 200 mg/dL — ABNORMAL HIGH (ref 70–99)
Potassium: 4.9 mmol/L (ref 3.5–5.1)
Sodium: 138 mmol/L (ref 135–145)
Total Bilirubin: 0.5 mg/dL (ref 0.3–1.2)
Total Protein: 6.9 g/dL (ref 6.5–8.1)

## 2020-10-04 LAB — CBC WITH DIFFERENTIAL/PLATELET
Abs Immature Granulocytes: 0.06 10*3/uL (ref 0.00–0.07)
Basophils Absolute: 0 10*3/uL (ref 0.0–0.1)
Basophils Relative: 1 %
Eosinophils Absolute: 0.1 10*3/uL (ref 0.0–0.5)
Eosinophils Relative: 1 %
HCT: 35.4 % — ABNORMAL LOW (ref 39.0–52.0)
Hemoglobin: 11.6 g/dL — ABNORMAL LOW (ref 13.0–17.0)
Immature Granulocytes: 1 %
Lymphocytes Relative: 14 %
Lymphs Abs: 0.9 10*3/uL (ref 0.7–4.0)
MCH: 34.1 pg — ABNORMAL HIGH (ref 26.0–34.0)
MCHC: 32.8 g/dL (ref 30.0–36.0)
MCV: 104.1 fL — ABNORMAL HIGH (ref 80.0–100.0)
Monocytes Absolute: 0.8 10*3/uL (ref 0.1–1.0)
Monocytes Relative: 13 %
Neutro Abs: 4.2 10*3/uL (ref 1.7–7.7)
Neutrophils Relative %: 70 %
Platelets: 257 10*3/uL (ref 150–400)
RBC: 3.4 MIL/uL — ABNORMAL LOW (ref 4.22–5.81)
RDW: 20.3 % — ABNORMAL HIGH (ref 11.5–15.5)
WBC: 6 10*3/uL (ref 4.0–10.5)
nRBC: 0 % (ref 0.0–0.2)

## 2020-10-04 LAB — TSH: TSH: 0.143 u[IU]/mL — ABNORMAL LOW (ref 0.350–4.500)

## 2020-10-04 MED ORDER — LEVOTHYROXINE SODIUM 112 MCG PO TABS: 112.0000 ug | ORAL_TABLET | Freq: Every day | ORAL | 0 refills | Status: DC

## 2020-10-04 NOTE — Progress Notes (Signed)
Wife reports that patient is intermittently confused x several days and having episodes of irritbility.
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
## 2020-10-05 ENCOUNTER — Other Ambulatory Visit: Payer: Self-pay

## 2020-10-05 ENCOUNTER — Inpatient Hospital Stay: Payer: Medicare PPO

## 2020-10-05 ENCOUNTER — Telehealth: Payer: Self-pay | Admitting: Nurse Practitioner

## 2020-10-05 ENCOUNTER — Ambulatory Visit
Admission: RE | Admit: 2020-10-05 | Discharge: 2020-10-05 | Disposition: A | Discharge status: Home / Self Care | Payer: Medicare PPO | Source: Ambulatory Visit | Attending: Nurse Practitioner | Admitting: Nurse Practitioner

## 2020-10-05 VITALS — BP 130/87 | HR 95 | Temp 97.6°F

## 2020-10-05 DIAGNOSIS — R410 Disorientation, unspecified: Secondary | ICD-10-CM | POA: Insufficient documentation

## 2020-10-05 DIAGNOSIS — C8223 Follicular lymphoma grade III, unspecified, intra-abdominal lymph nodes: Secondary | ICD-10-CM | POA: Diagnosis not present

## 2020-10-05 LAB — URINE CULTURE: Culture: NO GROWTH

## 2020-10-05 MED ORDER — SODIUM CHLORIDE 0.9 % IV SOLN
Freq: Once | INTRAVENOUS | Status: AC
  Filled 2020-10-05: qty 250

## 2020-10-05 MED ORDER — IOHEXOL 300 MG/ML  SOLN
75.0000 mL | Freq: Once | INTRAMUSCULAR | Status: AC | PRN
  Administered 2020-10-05: 75 mL via INTRAVENOUS

## 2020-10-05 NOTE — Progress Notes (Signed)
Patient tolerated IV NS infusion well today, no concerns voiced. Patient discharged with 20 g IV in L AC. Patient over to get Ct scan in medical mall with family member. Stable.

## 2020-10-05 NOTE — Telephone Encounter (Signed)
Called patient and wife to review results of CT. No answer. Left VM.

## 2020-10-08 ENCOUNTER — Encounter: Payer: Self-pay | Admitting: Oncology

## 2020-10-08 ENCOUNTER — Other Ambulatory Visit: Payer: Self-pay

## 2020-10-08 ENCOUNTER — Encounter: Payer: Self-pay | Admitting: Internal Medicine

## 2020-10-08 ENCOUNTER — Observation Stay: Payer: Medicare PPO

## 2020-10-08 ENCOUNTER — Telehealth: Payer: Self-pay | Admitting: Internal Medicine

## 2020-10-08 ENCOUNTER — Inpatient Hospital Stay
Admission: AD | Admit: 2020-10-08 | Discharge: 2020-10-15 | DRG: 070 | Disposition: A | Discharge status: Hospice | Payer: Medicare PPO | Source: Ambulatory Visit | Attending: Internal Medicine | Admitting: Internal Medicine

## 2020-10-08 ENCOUNTER — Inpatient Hospital Stay: Payer: Medicare PPO

## 2020-10-08 ENCOUNTER — Inpatient Hospital Stay (HOSPITAL_BASED_OUTPATIENT_CLINIC_OR_DEPARTMENT_OTHER): Payer: Medicare PPO | Admitting: Oncology

## 2020-10-08 VITALS — BP 173/84 | HR 74 | Temp 97.0°F | Resp 20

## 2020-10-08 DIAGNOSIS — C8223 Follicular lymphoma grade III, unspecified, intra-abdominal lymph nodes: Secondary | ICD-10-CM | POA: Diagnosis not present

## 2020-10-08 DIAGNOSIS — E039 Hypothyroidism, unspecified: Secondary | ICD-10-CM | POA: Diagnosis present

## 2020-10-08 DIAGNOSIS — F32A Depression, unspecified: Secondary | ICD-10-CM | POA: Diagnosis present

## 2020-10-08 DIAGNOSIS — E538 Deficiency of other specified B group vitamins: Secondary | ICD-10-CM | POA: Diagnosis present

## 2020-10-08 DIAGNOSIS — N183 Chronic kidney disease, stage 3 unspecified: Secondary | ICD-10-CM

## 2020-10-08 DIAGNOSIS — E1165 Type 2 diabetes mellitus with hyperglycemia: Secondary | ICD-10-CM | POA: Diagnosis present

## 2020-10-08 DIAGNOSIS — E1122 Type 2 diabetes mellitus with diabetic chronic kidney disease: Secondary | ICD-10-CM | POA: Diagnosis present

## 2020-10-08 DIAGNOSIS — C8220 Follicular lymphoma grade III, unspecified, unspecified site: Secondary | ICD-10-CM | POA: Diagnosis present

## 2020-10-08 DIAGNOSIS — H543 Unqualified visual loss, both eyes: Secondary | ICD-10-CM | POA: Diagnosis not present

## 2020-10-08 DIAGNOSIS — I10 Essential (primary) hypertension: Secondary | ICD-10-CM | POA: Diagnosis present

## 2020-10-08 DIAGNOSIS — E0590 Thyrotoxicosis, unspecified without thyrotoxic crisis or storm: Secondary | ICD-10-CM | POA: Diagnosis present

## 2020-10-08 DIAGNOSIS — F05 Delirium due to known physiological condition: Secondary | ICD-10-CM | POA: Diagnosis not present

## 2020-10-08 DIAGNOSIS — G939 Disorder of brain, unspecified: Secondary | ICD-10-CM | POA: Diagnosis not present

## 2020-10-08 DIAGNOSIS — L409 Psoriasis, unspecified: Secondary | ICD-10-CM | POA: Diagnosis present

## 2020-10-08 DIAGNOSIS — Z515 Encounter for palliative care: Secondary | ICD-10-CM

## 2020-10-08 DIAGNOSIS — Z79899 Other long term (current) drug therapy: Secondary | ICD-10-CM

## 2020-10-08 DIAGNOSIS — Z882 Allergy status to sulfonamides status: Secondary | ICD-10-CM

## 2020-10-08 DIAGNOSIS — N1831 Chronic kidney disease, stage 3a: Secondary | ICD-10-CM | POA: Diagnosis present

## 2020-10-08 DIAGNOSIS — R451 Restlessness and agitation: Secondary | ICD-10-CM | POA: Diagnosis present

## 2020-10-08 DIAGNOSIS — R410 Disorientation, unspecified: Secondary | ICD-10-CM

## 2020-10-08 DIAGNOSIS — I129 Hypertensive chronic kidney disease with stage 1 through stage 4 chronic kidney disease, or unspecified chronic kidney disease: Secondary | ICD-10-CM | POA: Diagnosis present

## 2020-10-08 DIAGNOSIS — D61818 Other pancytopenia: Secondary | ICD-10-CM | POA: Diagnosis present

## 2020-10-08 DIAGNOSIS — K219 Gastro-esophageal reflux disease without esophagitis: Secondary | ICD-10-CM | POA: Diagnosis present

## 2020-10-08 DIAGNOSIS — E119 Type 2 diabetes mellitus without complications: Secondary | ICD-10-CM

## 2020-10-08 DIAGNOSIS — Z7984 Long term (current) use of oral hypoglycemic drugs: Secondary | ICD-10-CM

## 2020-10-08 DIAGNOSIS — G9341 Metabolic encephalopathy: Principal | ICD-10-CM | POA: Diagnosis present

## 2020-10-08 DIAGNOSIS — Z794 Long term (current) use of insulin: Secondary | ICD-10-CM

## 2020-10-08 DIAGNOSIS — R338 Other retention of urine: Secondary | ICD-10-CM

## 2020-10-08 DIAGNOSIS — C8590 Non-Hodgkin lymphoma, unspecified, unspecified site: Principal | ICD-10-CM | POA: Diagnosis present

## 2020-10-08 DIAGNOSIS — Z7982 Long term (current) use of aspirin: Secondary | ICD-10-CM

## 2020-10-08 DIAGNOSIS — D7589 Other specified diseases of blood and blood-forming organs: Secondary | ICD-10-CM | POA: Diagnosis present

## 2020-10-08 DIAGNOSIS — U071 COVID-19: Secondary | ICD-10-CM | POA: Diagnosis present

## 2020-10-08 DIAGNOSIS — D539 Nutritional anemia, unspecified: Secondary | ICD-10-CM | POA: Diagnosis present

## 2020-10-08 DIAGNOSIS — Z79890 Hormone replacement therapy (postmenopausal): Secondary | ICD-10-CM

## 2020-10-08 DIAGNOSIS — E785 Hyperlipidemia, unspecified: Secondary | ICD-10-CM

## 2020-10-08 DIAGNOSIS — I2510 Atherosclerotic heart disease of native coronary artery without angina pectoris: Secondary | ICD-10-CM | POA: Diagnosis present

## 2020-10-08 DIAGNOSIS — F341 Dysthymic disorder: Secondary | ICD-10-CM

## 2020-10-08 DIAGNOSIS — Z87891 Personal history of nicotine dependence: Secondary | ICD-10-CM

## 2020-10-08 DIAGNOSIS — R339 Retention of urine, unspecified: Secondary | ICD-10-CM | POA: Diagnosis not present

## 2020-10-08 DIAGNOSIS — G9389 Other specified disorders of brain: Secondary | ICD-10-CM

## 2020-10-08 DIAGNOSIS — Z66 Do not resuscitate: Secondary | ICD-10-CM | POA: Diagnosis present

## 2020-10-08 DIAGNOSIS — R4182 Altered mental status, unspecified: Secondary | ICD-10-CM | POA: Diagnosis present

## 2020-10-08 HISTORY — DX: Unqualified visual loss, both eyes: H54.3

## 2020-10-08 LAB — BASIC METABOLIC PANEL
Anion gap: 11 (ref 5–15)
BUN: 22 mg/dL (ref 8–23)
CO2: 27 mmol/L (ref 22–32)
Calcium: 9 mg/dL (ref 8.9–10.3)
Chloride: 101 mmol/L (ref 98–111)
Creatinine, Ser: 1.73 mg/dL — ABNORMAL HIGH (ref 0.61–1.24)
GFR, Estimated: 42 mL/min — ABNORMAL LOW (ref >60)
Glucose, Bld: 180 mg/dL — ABNORMAL HIGH (ref 70–99)
Potassium: 4 mmol/L (ref 3.5–5.1)
Sodium: 139 mmol/L (ref 135–145)

## 2020-10-08 LAB — LACTIC ACID, PLASMA
Lactic Acid, Venous: 1.8 mmol/L (ref 0.5–1.9)
Lactic Acid, Venous: 0.8 mmol/L (ref 0.5–1.9)

## 2020-10-08 LAB — URINALYSIS, ROUTINE W REFLEX MICROSCOPIC
Bacteria, UA: NONE SEEN
Bilirubin Urine: NEGATIVE
Glucose, UA: NEGATIVE mg/dL
Hgb urine dipstick: NEGATIVE
Ketones, ur: NEGATIVE mg/dL
Leukocytes,Ua: NEGATIVE
Nitrite: NEGATIVE
Protein, ur: 30 mg/dL — AB
Specific Gravity, Urine: 1.014 (ref 1.005–1.030)
Squamous Epithelial / HPF: NONE SEEN (ref 0–5)
pH: 6 (ref 5.0–8.0)

## 2020-10-08 LAB — CBC WITH DIFFERENTIAL/PLATELET
Abs Immature Granulocytes: 0.07 10*3/uL (ref 0.00–0.07)
Basophils Absolute: 0.1 10*3/uL (ref 0.0–0.1)
Basophils Relative: 1 %
Eosinophils Absolute: 0.1 10*3/uL (ref 0.0–0.5)
Eosinophils Relative: 2 %
HCT: 33.6 % — ABNORMAL LOW (ref 39.0–52.0)
Hemoglobin: 11 g/dL — ABNORMAL LOW (ref 13.0–17.0)
Immature Granulocytes: 1 %
Lymphocytes Relative: 10 %
Lymphs Abs: 0.7 10*3/uL (ref 0.7–4.0)
MCH: 34.2 pg — ABNORMAL HIGH (ref 26.0–34.0)
MCHC: 32.7 g/dL (ref 30.0–36.0)
MCV: 104.3 fL — ABNORMAL HIGH (ref 80.0–100.0)
Monocytes Absolute: 0.7 10*3/uL (ref 0.1–1.0)
Monocytes Relative: 10 %
Neutro Abs: 5.3 10*3/uL (ref 1.7–7.7)
Neutrophils Relative %: 76 %
Platelets: 212 10*3/uL (ref 150–400)
RBC: 3.22 MIL/uL — ABNORMAL LOW (ref 4.22–5.81)
RDW: 19.4 % — ABNORMAL HIGH (ref 11.5–15.5)
WBC: 7 10*3/uL (ref 4.0–10.5)
nRBC: 0 % (ref 0.0–0.2)

## 2020-10-08 LAB — GLUCOSE, CAPILLARY
Glucose-Capillary: 107 mg/dL — ABNORMAL HIGH (ref 70–99)
Glucose-Capillary: 157 mg/dL — ABNORMAL HIGH (ref 70–99)

## 2020-10-08 LAB — APTT: aPTT: 27 seconds (ref 24–36)

## 2020-10-08 LAB — TSH: TSH: 0.122 u[IU]/mL — ABNORMAL LOW (ref 0.350–4.500)

## 2020-10-08 LAB — HEPATIC FUNCTION PANEL
ALT: 27 U/L (ref 0–44)
AST: 28 U/L (ref 15–41)
Albumin: 3.8 g/dL (ref 3.5–5.0)
Alkaline Phosphatase: 68 U/L (ref 38–126)
Bilirubin, Direct: <0.1 mg/dL (ref 0.0–0.2)
Total Bilirubin: 0.7 mg/dL (ref 0.3–1.2)
Total Protein: 6.8 g/dL (ref 6.5–8.1)

## 2020-10-08 LAB — PROTIME-INR
INR: 1.1 (ref 0.8–1.2)
Prothrombin Time: 13.5 seconds (ref 11.4–15.2)

## 2020-10-08 LAB — PHOSPHORUS: Phosphorus: 3.5 mg/dL (ref 2.5–4.6)

## 2020-10-08 LAB — PROCALCITONIN: Procalcitonin: <0.1 ng/mL

## 2020-10-08 LAB — MAGNESIUM: Magnesium: 2 mg/dL (ref 1.7–2.4)

## 2020-10-08 LAB — AMMONIA: Ammonia: 10 umol/L (ref 9–35)

## 2020-10-08 MED ORDER — VITAMIN B-12 1000 MCG PO TABS
1000.0000 ug | ORAL_TABLET | Freq: Every day | ORAL | Status: DC
  Administered 2020-10-09 – 2020-10-12 (×3): 1000 ug via ORAL
  Filled 2020-10-08 (×4): qty 1

## 2020-10-08 MED ORDER — METOPROLOL SUCCINATE ER 50 MG PO TB24
50.0000 mg | ORAL_TABLET | Freq: Every day | ORAL | Status: DC
  Administered 2020-10-08 – 2020-10-12 (×5): 50 mg via ORAL
  Filled 2020-10-08 (×5): qty 1

## 2020-10-08 MED ORDER — VITAMIN D 25 MCG (1000 UNIT) PO TABS
2000.0000 [IU] | ORAL_TABLET | Freq: Every day | ORAL | Status: DC
  Administered 2020-10-09 – 2020-10-10 (×2): 2000 [IU] via ORAL
  Filled 2020-10-08 (×3): qty 2

## 2020-10-08 MED ORDER — ROSUVASTATIN CALCIUM 20 MG PO TABS
20.0000 mg | ORAL_TABLET | Freq: Every day | ORAL | Status: DC
  Administered 2020-10-09 – 2020-10-10 (×2): 20 mg via ORAL
  Filled 2020-10-08 (×4): qty 1

## 2020-10-08 MED ORDER — NIFEDIPINE ER 60 MG PO TB24
60.0000 mg | ORAL_TABLET | Freq: Every day | ORAL | Status: DC
  Filled 2020-10-08 (×2): qty 1

## 2020-10-08 MED ORDER — INSULIN GLARGINE 100 UNIT/ML ~~LOC~~ SOLN
8.0000 [IU] | Freq: Every day | SUBCUTANEOUS | Status: DC
  Administered 2020-10-08 – 2020-10-12 (×4): 8 [IU] via SUBCUTANEOUS
  Filled 2020-10-08 (×6): qty 0.08

## 2020-10-08 MED ORDER — INSULIN ASPART 100 UNIT/ML ~~LOC~~ SOLN: 0.0000 [IU] | Freq: Every day | SUBCUTANEOUS | Status: DC

## 2020-10-08 MED ORDER — ADULT MULTIVITAMIN W/MINERALS CH
1.0000 | ORAL_TABLET | Freq: Every day | ORAL | Status: DC
  Administered 2020-10-09 – 2020-10-10 (×2): 1 via ORAL
  Filled 2020-10-08 (×3): qty 1

## 2020-10-08 MED ORDER — ACETAMINOPHEN 650 MG RE SUPP: 650.0000 mg | Freq: Four times a day (QID) | RECTAL | Status: AC | PRN

## 2020-10-08 MED ORDER — HYDROCOD POLST-CPM POLST ER 10-8 MG/5ML PO SUER: 5.0000 mL | Freq: Every evening | ORAL | Status: DC | PRN

## 2020-10-08 MED ORDER — PANTOPRAZOLE SODIUM 40 MG PO TBEC
40.0000 mg | DELAYED_RELEASE_TABLET | Freq: Every day | ORAL | Status: DC
  Administered 2020-10-09 – 2020-10-12 (×3): 40 mg via ORAL
  Filled 2020-10-08 (×5): qty 1

## 2020-10-08 MED ORDER — ONDANSETRON HCL 4 MG/2ML IJ SOLN: 4.0000 mg | Freq: Four times a day (QID) | INTRAMUSCULAR | Status: DC | PRN

## 2020-10-08 MED ORDER — ONDANSETRON HCL 4 MG PO TABS: 4.0000 mg | ORAL_TABLET | Freq: Four times a day (QID) | ORAL | Status: DC | PRN

## 2020-10-08 MED ORDER — ACETAMINOPHEN 325 MG PO TABS: 650.0000 mg | ORAL_TABLET | Freq: Four times a day (QID) | ORAL | Status: AC | PRN

## 2020-10-08 MED ORDER — INSULIN DEGLUDEC 200 UNIT/ML ~~LOC~~ SOPN: 8.0000 [IU] | PEN_INJECTOR | Freq: Every day | SUBCUTANEOUS | Status: DC

## 2020-10-08 MED ORDER — LEVOTHYROXINE SODIUM 112 MCG PO TABS
112.0000 ug | ORAL_TABLET | Freq: Every day | ORAL | Status: DC
  Administered 2020-10-09: 112 ug via ORAL
  Filled 2020-10-08 (×2): qty 1

## 2020-10-08 MED ORDER — INSULIN ASPART 100 UNIT/ML ~~LOC~~ SOLN
0.0000 [IU] | Freq: Three times a day (TID) | SUBCUTANEOUS | Status: DC
  Administered 2020-10-09: 3 [IU] via SUBCUTANEOUS
  Administered 2020-10-09 – 2020-10-10 (×2): 2 [IU] via SUBCUTANEOUS
  Administered 2020-10-10: 13:00:00 5 [IU] via SUBCUTANEOUS
  Administered 2020-10-10 – 2020-10-11 (×3): 3 [IU] via SUBCUTANEOUS
  Administered 2020-10-12: 08:00:00 2 [IU] via SUBCUTANEOUS
  Administered 2020-10-12 (×2): 3 [IU] via SUBCUTANEOUS
  Filled 2020-10-08 (×10): qty 1

## 2020-10-08 MED ORDER — ALBUTEROL SULFATE HFA 108 (90 BASE) MCG/ACT IN AERS
2.0000 | INHALATION_SPRAY | Freq: Four times a day (QID) | RESPIRATORY_TRACT | Status: DC | PRN
  Filled 2020-10-08: qty 6.7

## 2020-10-08 NOTE — Telephone Encounter (Signed)
On 3/25-I spoke to patient's wife regarding the concerns noted on the CT scan-enhancement concerning for lymphoma versus infection.  This needs further work-up; neurology evaluation; including LP. I spoke to Dr. Manuella Ghazi- who recommends LP- sending for infectious work up including JC virus; and serum JC virus PCR.  C- please schedule [facility] visit today re: confusion; labs- cbc/cmp  Spoke to [place]; will evaluate the pt today.   Benjie Karvonen- please call wife; and get the pt in to [facility] today.   Thanks, GB
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
## 2020-10-08 NOTE — Progress Notes (Signed)
Patient here today as add on for [facility]. Patient more confused per wife, patient denies any nausea or vomiting today. Patients wife reports reduced intake at home. Patient denies pain today.

## 2020-10-09 ENCOUNTER — Observation Stay: Payer: Medicare PPO

## 2020-10-09 DIAGNOSIS — R338 Other retention of urine: Secondary | ICD-10-CM | POA: Diagnosis not present

## 2020-10-09 DIAGNOSIS — E1165 Type 2 diabetes mellitus with hyperglycemia: Secondary | ICD-10-CM | POA: Diagnosis not present

## 2020-10-09 DIAGNOSIS — I129 Hypertensive chronic kidney disease with stage 1 through stage 4 chronic kidney disease, or unspecified chronic kidney disease: Secondary | ICD-10-CM | POA: Diagnosis present

## 2020-10-09 DIAGNOSIS — Z8616 Personal history of COVID-19: Secondary | ICD-10-CM | POA: Diagnosis not present

## 2020-10-09 DIAGNOSIS — R410 Disorientation, unspecified: Secondary | ICD-10-CM | POA: Diagnosis not present

## 2020-10-09 DIAGNOSIS — N189 Chronic kidney disease, unspecified: Secondary | ICD-10-CM

## 2020-10-09 DIAGNOSIS — K219 Gastro-esophageal reflux disease without esophagitis: Secondary | ICD-10-CM | POA: Diagnosis present

## 2020-10-09 DIAGNOSIS — C8590 Non-Hodgkin lymphoma, unspecified, unspecified site: Secondary | ICD-10-CM

## 2020-10-09 DIAGNOSIS — Z515 Encounter for palliative care: Secondary | ICD-10-CM | POA: Diagnosis not present

## 2020-10-09 DIAGNOSIS — G9341 Metabolic encephalopathy: Secondary | ICD-10-CM | POA: Diagnosis present

## 2020-10-09 DIAGNOSIS — E1122 Type 2 diabetes mellitus with diabetic chronic kidney disease: Secondary | ICD-10-CM | POA: Diagnosis present

## 2020-10-09 DIAGNOSIS — H543 Unqualified visual loss, both eyes: Secondary | ICD-10-CM | POA: Diagnosis present

## 2020-10-09 DIAGNOSIS — G9340 Encephalopathy, unspecified: Secondary | ICD-10-CM | POA: Diagnosis not present

## 2020-10-09 DIAGNOSIS — I10 Essential (primary) hypertension: Secondary | ICD-10-CM | POA: Diagnosis not present

## 2020-10-09 DIAGNOSIS — D539 Nutritional anemia, unspecified: Secondary | ICD-10-CM

## 2020-10-09 DIAGNOSIS — R4182 Altered mental status, unspecified: Secondary | ICD-10-CM | POA: Diagnosis present

## 2020-10-09 DIAGNOSIS — E039 Hypothyroidism, unspecified: Secondary | ICD-10-CM | POA: Diagnosis present

## 2020-10-09 DIAGNOSIS — E785 Hyperlipidemia, unspecified: Secondary | ICD-10-CM | POA: Diagnosis present

## 2020-10-09 DIAGNOSIS — E119 Type 2 diabetes mellitus without complications: Secondary | ICD-10-CM | POA: Diagnosis not present

## 2020-10-09 DIAGNOSIS — Z7982 Long term (current) use of aspirin: Secondary | ICD-10-CM | POA: Diagnosis not present

## 2020-10-09 DIAGNOSIS — D7589 Other specified diseases of blood and blood-forming organs: Secondary | ICD-10-CM | POA: Diagnosis present

## 2020-10-09 DIAGNOSIS — U071 COVID-19: Secondary | ICD-10-CM | POA: Diagnosis present

## 2020-10-09 DIAGNOSIS — Z794 Long term (current) use of insulin: Secondary | ICD-10-CM | POA: Diagnosis not present

## 2020-10-09 DIAGNOSIS — C8220 Follicular lymphoma grade III, unspecified, unspecified site: Secondary | ICD-10-CM | POA: Diagnosis present

## 2020-10-09 DIAGNOSIS — Z79899 Other long term (current) drug therapy: Secondary | ICD-10-CM | POA: Diagnosis not present

## 2020-10-09 DIAGNOSIS — Z66 Do not resuscitate: Secondary | ICD-10-CM | POA: Diagnosis present

## 2020-10-09 DIAGNOSIS — G9389 Other specified disorders of brain: Secondary | ICD-10-CM | POA: Diagnosis not present

## 2020-10-09 DIAGNOSIS — R404 Transient alteration of awareness: Secondary | ICD-10-CM | POA: Diagnosis not present

## 2020-10-09 DIAGNOSIS — N183 Chronic kidney disease, stage 3 unspecified: Secondary | ICD-10-CM | POA: Diagnosis not present

## 2020-10-09 DIAGNOSIS — N1831 Chronic kidney disease, stage 3a: Secondary | ICD-10-CM | POA: Diagnosis present

## 2020-10-09 DIAGNOSIS — G939 Disorder of brain, unspecified: Secondary | ICD-10-CM | POA: Diagnosis present

## 2020-10-09 DIAGNOSIS — Z882 Allergy status to sulfonamides status: Secondary | ICD-10-CM | POA: Diagnosis not present

## 2020-10-09 DIAGNOSIS — F32A Depression, unspecified: Secondary | ICD-10-CM | POA: Diagnosis present

## 2020-10-09 DIAGNOSIS — N184 Chronic kidney disease, stage 4 (severe): Secondary | ICD-10-CM | POA: Diagnosis not present

## 2020-10-09 DIAGNOSIS — C8223 Follicular lymphoma grade III, unspecified, intra-abdominal lymph nodes: Secondary | ICD-10-CM | POA: Diagnosis not present

## 2020-10-09 DIAGNOSIS — R339 Retention of urine, unspecified: Secondary | ICD-10-CM | POA: Diagnosis not present

## 2020-10-09 DIAGNOSIS — Z79890 Hormone replacement therapy (postmenopausal): Secondary | ICD-10-CM | POA: Diagnosis not present

## 2020-10-09 DIAGNOSIS — D61818 Other pancytopenia: Secondary | ICD-10-CM | POA: Diagnosis present

## 2020-10-09 DIAGNOSIS — R451 Restlessness and agitation: Secondary | ICD-10-CM | POA: Diagnosis present

## 2020-10-09 LAB — CBC
HCT: 31.4 % — ABNORMAL LOW (ref 39.0–52.0)
Hemoglobin: 10.7 g/dL — ABNORMAL LOW (ref 13.0–17.0)
MCH: 34.9 pg — ABNORMAL HIGH (ref 26.0–34.0)
MCHC: 34.1 g/dL (ref 30.0–36.0)
MCV: 102.3 fL — ABNORMAL HIGH (ref 80.0–100.0)
Platelets: 203 10*3/uL (ref 150–400)
RBC: 3.07 MIL/uL — ABNORMAL LOW (ref 4.22–5.81)
RDW: 18.6 % — ABNORMAL HIGH (ref 11.5–15.5)
WBC: 6.1 10*3/uL (ref 4.0–10.5)
nRBC: 0 % (ref 0.0–0.2)

## 2020-10-09 LAB — CSF CELL COUNT WITH DIFFERENTIAL
Eosinophils, CSF: 0 %
Eosinophils, CSF: 1 %
Lymphs, CSF: 22 %
Lymphs, CSF: 73 %
Monocyte-Macrophage-Spinal Fluid: 13 %
Monocyte-Macrophage-Spinal Fluid: 14 %
RBC Count, CSF: 800 /mm3 — ABNORMAL HIGH (ref 0–3)
RBC Count, CSF: 9905 /mm3 — ABNORMAL HIGH (ref 0–3)
Segmented Neutrophils-CSF: 14 %
Segmented Neutrophils-CSF: 63 %
Tube #: 3
Tube #: 4
WBC, CSF: 16 /mm3 (ref 0–5)
WBC, CSF: 55 /mm3 (ref 0–5)

## 2020-10-09 LAB — BASIC METABOLIC PANEL
Anion gap: 7 (ref 5–15)
BUN: 21 mg/dL (ref 8–23)
CO2: 27 mmol/L (ref 22–32)
Calcium: 9 mg/dL (ref 8.9–10.3)
Chloride: 104 mmol/L (ref 98–111)
Creatinine, Ser: 1.54 mg/dL — ABNORMAL HIGH (ref 0.61–1.24)
GFR, Estimated: 48 mL/min — ABNORMAL LOW (ref >60)
Glucose, Bld: 163 mg/dL — ABNORMAL HIGH (ref 70–99)
Potassium: 4 mmol/L (ref 3.5–5.1)
Sodium: 138 mmol/L (ref 135–145)

## 2020-10-09 LAB — GLUCOSE, CAPILLARY
Glucose-Capillary: 102 mg/dL — ABNORMAL HIGH (ref 70–99)
Glucose-Capillary: 103 mg/dL — ABNORMAL HIGH (ref 70–99)
Glucose-Capillary: 127 mg/dL — ABNORMAL HIGH (ref 70–99)
Glucose-Capillary: 167 mg/dL — ABNORMAL HIGH (ref 70–99)
Glucose-Capillary: 175 mg/dL — ABNORMAL HIGH (ref 70–99)
Glucose-Capillary: 56 mg/dL — ABNORMAL LOW (ref 70–99)
Glucose-Capillary: 58 mg/dL — ABNORMAL LOW (ref 70–99)
Glucose-Capillary: 65 mg/dL — ABNORMAL LOW (ref 70–99)
Glucose-Capillary: 85 mg/dL (ref 70–99)

## 2020-10-09 LAB — PROCALCITONIN: Procalcitonin: <0.1 ng/mL

## 2020-10-09 LAB — PROTEIN, CSF: Total  Protein, CSF: 53 mg/dL — ABNORMAL HIGH (ref 15–45)

## 2020-10-09 LAB — GLUCOSE, CSF: Glucose, CSF: 88 mg/dL — ABNORMAL HIGH (ref 40–70)

## 2020-10-09 LAB — T4, FREE: Free T4: 1.41 ng/dL — ABNORMAL HIGH (ref 0.61–1.12)

## 2020-10-09 LAB — SARS CORONAVIRUS 2 (TAT 6-24 HRS): SARS Coronavirus 2: POSITIVE — AB

## 2020-10-09 MED ORDER — VALACYCLOVIR HCL 500 MG PO TABS
1000.0000 mg | ORAL_TABLET | Freq: Two times a day (BID) | ORAL | Status: DC
Start: 2020-10-09 — End: 2020-10-11
  Administered 2020-10-09 – 2020-10-10 (×3): 1000 mg via ORAL
  Filled 2020-10-09 (×5): qty 2

## 2020-10-09 MED ORDER — QUETIAPINE FUMARATE 25 MG PO TABS
50.0000 mg | ORAL_TABLET | Freq: Every day | ORAL | Status: DC
  Administered 2020-10-09 – 2020-10-12 (×3): 50 mg via ORAL
  Filled 2020-10-09 (×3): qty 2

## 2020-10-09 MED ORDER — HYDRALAZINE HCL 20 MG/ML IJ SOLN
10.0000 mg | Freq: Four times a day (QID) | INTRAMUSCULAR | Status: DC | PRN
  Administered 2020-10-12 – 2020-10-15 (×3): 10 mg via INTRAVENOUS
  Filled 2020-10-09 (×3): qty 1

## 2020-10-09 MED ORDER — LORAZEPAM 2 MG/ML IJ SOLN
1.0000 mg | Freq: Four times a day (QID) | INTRAMUSCULAR | Status: DC | PRN
  Administered 2020-10-09 (×2): 1 mg via INTRAMUSCULAR
  Filled 2020-10-09 (×2): qty 1

## 2020-10-09 MED ORDER — HALOPERIDOL LACTATE 5 MG/ML IJ SOLN
5.0000 mg | Freq: Four times a day (QID) | INTRAMUSCULAR | Status: DC | PRN
  Administered 2020-10-09 – 2020-10-12 (×5): 5 mg via INTRAMUSCULAR
  Filled 2020-10-09 (×6): qty 1

## 2020-10-09 MED ORDER — ZIPRASIDONE MESYLATE 20 MG IM SOLR
10.0000 mg | Freq: Once | INTRAMUSCULAR | Status: AC
  Administered 2020-10-09: 10 mg via INTRAMUSCULAR
  Filled 2020-10-09: qty 20

## 2020-10-09 MED ORDER — HALOPERIDOL LACTATE 5 MG/ML IJ SOLN
5.0000 mg | Freq: Once | INTRAMUSCULAR | Status: AC
  Administered 2020-10-09: 5 mg via INTRAMUSCULAR
  Filled 2020-10-09: qty 1

## 2020-10-09 MED ORDER — HALOPERIDOL LACTATE 5 MG/ML IJ SOLN
2.5000 mg | Freq: Once | INTRAMUSCULAR | Status: AC
  Administered 2020-10-09: 03:00:00 2.5 mg via INTRAMUSCULAR
  Filled 2020-10-09: qty 1

## 2020-10-09 MED ORDER — LORAZEPAM 1 MG PO TABS: 1.0000 mg | ORAL_TABLET | Freq: Four times a day (QID) | ORAL | Status: DC | PRN
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
## 2020-10-09 NOTE — Progress Notes (Signed)
Patient became confused and agitated this shift, wanted to go home, states that nursing was lying to him about the time of day.   Pulled out 2 IV's. Hospitalist notified, haldol 2.5  IM given with improvement. Will continue to monitor.

## 2020-10-09 NOTE — Progress Notes (Addendum)
Hypoglycemic Event  CBG: 56  Treatment: Orange juice and snack   Symptoms: no symptoms   Follow-up CBG: Time: 2214 CBG Result: 85 2156 -------------------------------------------- 65 2147______________________________58 Possible Reasons for Event: Did not eat supper.  Comments/MD notified:Brenda Randol Kern at 3/29 2144    Lauralee Evener Keelon  Pt blood sugar checked at 2128. BS was 56. No IV access. Orange juice 255ml given. Recheck completed approx every 15 min. It was 58, 65, 85, and 102 at 2236. Pt received orange juice. Apple sauce, grape juice and milk with graham crackers. Recheck 1 hour later and 103. Will continue to monitor and encourage intake. On call provider B. Randol Kern notified and Lantus held.

## 2020-10-09 NOTE — Progress Notes (Signed)
Pt stable after LP.Back stable.F/U with his MD.Thankyou.

## 2020-10-10 ENCOUNTER — Ambulatory Visit: Payer: Medicare PPO

## 2020-10-10 DIAGNOSIS — G9389 Other specified disorders of brain: Secondary | ICD-10-CM

## 2020-10-10 DIAGNOSIS — R338 Other retention of urine: Secondary | ICD-10-CM

## 2020-10-10 DIAGNOSIS — G9340 Encephalopathy, unspecified: Secondary | ICD-10-CM | POA: Diagnosis not present

## 2020-10-10 DIAGNOSIS — R410 Disorientation, unspecified: Secondary | ICD-10-CM

## 2020-10-10 DIAGNOSIS — N189 Chronic kidney disease, unspecified: Secondary | ICD-10-CM | POA: Diagnosis not present

## 2020-10-10 LAB — BASIC METABOLIC PANEL
Anion gap: 9 (ref 5–15)
BUN: 22 mg/dL (ref 8–23)
CO2: 27 mmol/L (ref 22–32)
Calcium: 9.2 mg/dL (ref 8.9–10.3)
Chloride: 101 mmol/L (ref 98–111)
Creatinine, Ser: 1.59 mg/dL — ABNORMAL HIGH (ref 0.61–1.24)
GFR, Estimated: 46 mL/min — ABNORMAL LOW (ref >60)
Glucose, Bld: 200 mg/dL — ABNORMAL HIGH (ref 70–99)
Potassium: 3.8 mmol/L (ref 3.5–5.1)
Sodium: 137 mmol/L (ref 135–145)

## 2020-10-10 LAB — MISC LABCORP TEST (SEND OUT)
Labcorp test code: 139800
Labcorp test code: 139835

## 2020-10-10 LAB — GLUCOSE, CAPILLARY
Glucose-Capillary: 172 mg/dL — ABNORMAL HIGH (ref 70–99)
Glucose-Capillary: 207 mg/dL — ABNORMAL HIGH (ref 70–99)
Glucose-Capillary: 125 mg/dL — ABNORMAL HIGH (ref 70–99)
Glucose-Capillary: 191 mg/dL — ABNORMAL HIGH (ref 70–99)

## 2020-10-10 LAB — PROCALCITONIN: Procalcitonin: <0.1 ng/mL

## 2020-10-10 LAB — CBC
HCT: 32.4 % — ABNORMAL LOW (ref 39.0–52.0)
Hemoglobin: 11.2 g/dL — ABNORMAL LOW (ref 13.0–17.0)
MCH: 34.9 pg — ABNORMAL HIGH (ref 26.0–34.0)
MCHC: 34.6 g/dL (ref 30.0–36.0)
MCV: 100.9 fL — ABNORMAL HIGH (ref 80.0–100.0)
Platelets: 212 10*3/uL (ref 150–400)
RBC: 3.21 MIL/uL — ABNORMAL LOW (ref 4.22–5.81)
RDW: 18.8 % — ABNORMAL HIGH (ref 11.5–15.5)
WBC: 5.8 10*3/uL (ref 4.0–10.5)
nRBC: 0 % (ref 0.0–0.2)

## 2020-10-10 MED ORDER — LEVOTHYROXINE SODIUM 50 MCG PO TABS: 50.0000 ug | ORAL_TABLET | Freq: Every day | ORAL | Status: DC

## 2020-10-10 MED ORDER — ENOXAPARIN SODIUM 40 MG/0.4ML ~~LOC~~ SOLN
40.0000 mg | SUBCUTANEOUS | Status: DC
  Administered 2020-10-10 – 2020-10-12 (×3): 40 mg via SUBCUTANEOUS
  Filled 2020-10-10 (×3): qty 0.4

## 2020-10-10 MED ORDER — BETHANECHOL CHLORIDE 25 MG PO TABS
25.0000 mg | ORAL_TABLET | Freq: Once | ORAL | Status: AC
  Administered 2020-10-10: 25 mg via ORAL
  Filled 2020-10-10: qty 1

## 2020-10-10 MED ORDER — LORAZEPAM 2 MG/ML IJ SOLN
0.5000 mg | Freq: Once | INTRAMUSCULAR | Status: AC
  Administered 2020-10-10: 0.5 mg via INTRAMUSCULAR
  Filled 2020-10-10: qty 1

## 2020-10-10 NOTE — Progress Notes (Signed)
Attempted EEG Note  Patient was combative and ripped his leads off within 4 minutes of recording start. Unable to be calmed down for rehook. No definite definitive epileptiform abnl in the 4 minutes recorded.  Su Monks, MD [facility] [phone]  If 7pm- 7am, please page neurology on call as listed in [place].

## 2020-10-10 NOTE — Progress Notes (Signed)
Patient Voided 600cc in [facility] with 2 assist

## 2020-10-10 NOTE — Progress Notes (Signed)
Patient ID: Mitchell Snee., male   DOB: [birth year], 70 y.o.   MRN: [id number] [facility] PROGRESS NOTE  [facility]. TMH:[id number] DOB: [birth year] DOA: 10/08/2020 PCP: Baxter Hire, MD  HPI/Subjective: Patient is not the best historian today.  He has had some more confusion requiring Haldol and Seroquel.  The patient was able to follow commands and eat this morning.  Objective: Vitals:   10/09/20 2359 10/10/20 0348  BP: 116/65 (!) 152/88  Pulse: 65 78  Resp: 15 18  Temp: 98.4 F (36.9 C) 97.9 F (36.6 C)  SpO2: 98% 95%    Intake/Output Summary (Last 24 hours) at 10/10/2020 1505 Last data filed at 10/10/2020 1400 Gross per 24 hour  Intake 950 ml  Output --  Net 950 ml   Filed Weights   10/09/20 2016  Weight: 68.1 kg    ROS: Review of Systems  Cardiovascular: Negative for chest pain.  Gastrointestinal: Negative for abdominal pain.   Exam: Physical Exam HENT:     Head: Normocephalic.     Mouth/Throat:     Pharynx: No oropharyngeal exudate.  Eyes:     General: Lids are normal.     Conjunctiva/sclera: Conjunctivae normal.     Comments: Pupils clouded over.  Cardiovascular:     Rate and Rhythm: Normal rate and regular rhythm.     Heart sounds: Normal heart sounds, S1 normal and S2 normal.  Pulmonary:     Breath sounds: No decreased breath sounds, wheezing, rhonchi or rales.  Abdominal:     Palpations: Abdomen is soft.     Tenderness: There is no abdominal tenderness.  Musculoskeletal:     Right ankle: No swelling.     Left ankle: No swelling.  Skin:    General: Skin is warm.     Findings: No rash.  Neurological:     Mental Status: He is alert.     Comments: Patient was able to straight leg raise to command.  Patient was able to have good grip strength and arm strength to command.       Data Reviewed: Basic Metabolic Panel: Recent Labs  Lab 10/04/20 0948 10/08/20 1128 10/08/20 1545 10/09/20 0538 10/10/20 0600  NA 138 139  --   138 137  K 4.9 4.0  --  4.0 3.8  CL 100 101  --  104 101  CO2 28 27  --  27 27  GLUCOSE 200* 180*  --  163* 200*  BUN 21 22  --  21 22  CREATININE 1.64* 1.73*  --  1.54* 1.59*  CALCIUM 9.2 9.0  --  9.0 9.2  MG  --   --  2.0  --   --   PHOS  --   --  3.5  --   --    Liver Function Tests: Recent Labs  Lab 10/04/20 0948 10/08/20 1545  AST 24 28  ALT 21 27  ALKPHOS 62 68  BILITOT 0.5 0.7  PROT 6.9 6.8  ALBUMIN 3.9 3.8    Recent Labs  Lab 10/08/20 1545  AMMONIA 10   CBC: Recent Labs  Lab 10/04/20 0948 10/08/20 1128 10/09/20 0538 10/10/20 0600  WBC 6.0 7.0 6.1 5.8  NEUTROABS 4.2 5.3  --   --   HGB 11.6* 11.0* 10.7* 11.2*  HCT 35.4* 33.6* 31.4* 32.4*  MCV 104.1* 104.3* 102.3* 100.9*  PLT 257 212 203 212   BNP (last 3 results) Recent Labs    09/13/20 1056  BNP 84.3     CBG: Recent Labs  Lab 10/09/20 2214 10/09/20 2236 10/09/20 2331 10/10/20 0707 10/10/20 1159  GLUCAP 85 102* 103* 172* 207*    Recent Results (from the past 240 hour(s))  Urine culture     Status: None   Collection Time: 10/04/20  9:48 AM   Specimen: Urine, Clean Catch  Result Value Ref Range Status   Specimen Description   Final    URINE, CLEAN CATCH Performed at [facility], [address], [place], [place] [address]    Special Requests   Final    NONE Performed at [facility], [address], [place], [place] [address]    Culture   Final    NO GROWTH Performed at [facility], [place] [address] [place], [place] [address]    Report Status 10/05/2020 FINAL  Final  CULTURE, BLOOD (ROUTINE X 2) w Reflex to ID Panel     Status: None (Preliminary result)   Collection Time: 10/08/20  3:45 PM   Specimen: BLOOD  Result Value Ref Range Status   Specimen Description BLOOD RIGHT ANTECUBITAL  Final   Special Requests   Final    BOTTLES DRAWN AEROBIC AND ANAEROBIC Blood Culture adequate volume   Culture   Final    NO GROWTH 2 DAYS Performed at [facility], [address] [place], [place] [address]    Report Status PENDING  Incomplete  CULTURE, BLOOD (ROUTINE X 2) w Reflex to ID Panel     Status: None (Preliminary result)   Collection Time: 10/08/20  3:45 PM   Specimen: BLOOD  Result Value Ref Range Status   Specimen Description BLOOD LEFT ANTECUBITAL  Final   Special Requests   Final    BOTTLES DRAWN AEROBIC AND ANAEROBIC Blood Culture adequate volume   Culture   Final    NO GROWTH 2 DAYS Performed at [facility], [place], [place] [address]    Report Status PENDING  Incomplete  SARS CORONAVIRUS 2 (TAT 6-24 HRS) Nasopharyngeal Nasopharyngeal Swab     Status: Abnormal   Collection Time: 10/08/20  5:52 PM   Specimen: Nasopharyngeal Swab  Result Value Ref Range Status   SARS Coronavirus 2 POSITIVE (A) NEGATIVE Final    Comment: (NOTE) SARS-CoV-2 target nucleic acids are DETECTED.  The SARS-CoV-2 RNA is generally detectable in upper and lower respiratory specimens during the acute phase of infection. Positive results are indicative of the presence of SARS-CoV-2 RNA. Clinical correlation with patient history and other diagnostic information is  necessary to determine patient infection status. Positive results do not rule out bacterial infection or co-infection with other viruses.  The expected result is Negative.  Fact Sheet for Patients: SugarRoll.be  Fact Sheet for Healthcare Providers: https://www.woods-mathews.com/  This test is not yet approved or cleared by the [place] FDA and  has been authorized for detection and/or diagnosis of SARS-CoV-2 by FDA under an Emergency Use Authorization (EUA). This EUA will remain  in effect (meaning this test can be used) for the duration of the COVID-19 declaration under Section 564(b)(1) of the Act, 21 U. S.C. section 360bbb-3(b)(1), unless the authorization is terminated or revoked sooner.   Performed at  [facility], [place] [address] [place], [place] [address]   Culture, fungus without smear     Status: None (Preliminary result)   Collection Time: 10/09/20  8:04 AM   Specimen: CSF; Cerebrospinal Fluid  Result Value Ref  Range Status   Specimen Description   Final    CSF Performed at [facility], [address] [place], [place] [address]    Special Requests   Final    NONE Performed at [facility], [address] [place], [place] [address]    Culture   Final    NO FUNGUS ISOLATED AFTER 1 DAY Performed at [facility], [place] [address], [place], [place] [address]    Report Status PENDING  Incomplete  CSF culture w Gram Stain     Status: None (Preliminary result)   Collection Time: 10/09/20  8:04 AM   Specimen: PATH Cytology CSF; Cerebrospinal Fluid  Result Value Ref Range Status   Specimen Description   Final    CSF Performed at [facility], [address] [place], [place] [address]    Special Requests   Final    NONE Performed at [facility], [place] [place], [place] [address]    Gram Stain   Final    CYTOSPIN SLIDE WBC PRESENT, PREDOMINANTLY MONONUCLEAR RBCS PRESENT NO ORGANISMS SEEN Performed at [facility], [address] [place], [place] [address]    Culture   Final    NO GROWTH 1 DAY Performed at [facility], [place] [address] [place], [place] [address]    Report Status PENDING  Incomplete     Studies: DG Chest Port 1 View  Result Date: 10/08/2020 CLINICAL DATA:  Confusion. EXAM: PORTABLE CHEST 1 VIEW COMPARISON:  September 25, 2020. FINDINGS: The heart size and mediastinal contours are within normal limits. Both lungs are clear. The visualized skeletal structures are unremarkable. IMPRESSION: No active disease. Electronically Signed   By: Marijo Conception M.D.   On: 10/08/2020 15:58   DG FL GUIDED LUMBAR PUNCTURE  Result Date: 10/09/2020 CLINICAL DATA:  Altered mental status. EXAM:  DIAGNOSTIC LUMBAR PUNCTURE UNDER FLUOROSCOPIC GUIDANCE COMPARISON:  No prior. FLUOROSCOPY TIME:  Fluoroscopy Time:  2 minutes 54 seconds Radiation Exposure Index (if provided by the fluoroscopic device): 40.9 mGy PROCEDURE: Informed consent was obtained from the patient prior to the procedure, including potential complications of headache, allergy, bleeding, and pain. With the patient prone, the lower back was prepped with Betadine. 1% Lidocaine was used for local anesthesia. Lumbar puncture was performed at the L3-L4 level using a 22 gauge needle with return of slightly blood tinged CSF which rapidly cleared. 11 ml of CSF were obtained for laboratory studies. Opening pressure not obtained as CSF just filled the hub of the needle. The patient tolerated the procedure well and there were no apparent complications. IMPRESSION: Successful fluoroscopically guided lumbar puncture. Electronically Signed   By: Marcello Moores  Register   On: 10/09/2020 08:48    Scheduled Meds: . bethanechol  25 mg Oral Once  . cholecalciferol  2,000 Units Oral Daily  . enoxaparin (LOVENOX) injection  40 mg Subcutaneous Q24H  . insulin aspart  0-15 Units Subcutaneous TID WC  . insulin aspart  0-5 Units Subcutaneous QHS  . insulin glargine  8 Units Subcutaneous QHS  . levothyroxine  112 mcg Oral Q0600  . metoprolol succinate  50 mg Oral Daily  . multivitamin with minerals  1 tablet Oral Daily  . pantoprazole  40 mg Oral Daily  . QUEtiapine  50 mg Oral QHS  . rosuvastatin  20 mg Oral Daily  . valACYclovir  1,000 mg Oral BID  . vitamin B-12  1,000 mcg Oral Daily  Assessment/Plan:  1. Acute delirium, as needed Haldol and Seroquel at night. 2. 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  This was not mentioned on the previous head CT scan reading on 09/13/2020.  The radiologist to read this CT scan commented that it was present on the prior CAT scan but it was not mentioned in the prior CAT scan report.  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  On empiric valtrex. 3. 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  Patient on glargine insulin and sliding scale. 4. Hyperlipidemia unspecified on Crestor 5. Hypothyroidism unspecified on levothyroxine.  TSH still in the hyperthyroid range drop levothyroxine down to 50 mcg daily 6. Urinary retention.  Hopefully will not need to do in and out catheterizations.  Hopefully he will urinate.  Give 1 dose of Urecholine. 7. Essential hypertension on metoprolol 8. Blind in both eyes     Code Status:     Code Status Orders  (From admission, onward)         Start     Ordered   10/08/20 1612  Do not attempt resuscitation (DNR)  Continuous       Question Answer Comment  In the event of cardiac or respiratory ARREST Do not call a "code blue"   In the event of cardiac or respiratory ARREST Do not perform Intubation, CPR, defibrillation or ACLS   In the event of cardiac or respiratory ARREST Use medication by any route, position, wound care, and other measures to relive pain and suffering. May use oxygen, suction and manual treatment of airway obstruction as needed for comfort.      10/08/20 1612        Code Status History    Date Active Date Inactive Code Status Order ID Comments User Context   10/08/2020 1511 10/08/2020 1612 Full Code [id number]  Criss Alvine, DO Inpatient   09/13/2020 1851 09/15/2020 1745 Full Code [id number]  Rhetta Mura DO Inpatient   08/13/2020 1504 08/16/2020 1750 Full Code [id number]  CoxBriant Cedar, DO ED   Advance Care Planning Activity    Advance Directive Documentation   Flowsheet Row Most Recent Value  Type of Advance Directive Living will  Pre-existing out of facility DNR order (yellow form or pink MOST form) --  "MOST" Form in Place? --     Family Communication: Spoke with wife at the bedside Disposition Plan: Status is: Inpatient  Dispo: The patient is from: Home              Anticipated d/c is to: Home               Patient currently needs to be watched here in the hospital with acute delirium.  Also will need a diagnosis of the brain mass prior to disposition   Difficult to place patient.  Hopefully not.  Time spent: 28 minutes.  Case discussed with infectious disease.  [place]  [facility] [employer]

## 2020-10-10 NOTE — Progress Notes (Signed)
Patient getting agitated, gave Haldol 5mg  IM per PRN order.

## 2020-10-10 NOTE — Progress Notes (Signed)
Patient stated he needed to void but was not. Beginning to get agitated. Bladder scan showed 400cc retained. MD notified.

## 2020-10-10 NOTE — Progress Notes (Signed)
Date of Admission:  10/08/2020     ID: Gwen Her Mrozek Brooke Bonito. is a 70 y.o. male Active Problems:   Blindness of both eyes   CKD stage 3 due to type 2 diabetes mellitus ([place])   Essential hypertension   Lymphoma, non-Hodgkin's (HCC)   Follicular lymphoma grade III, unspecified, intra-abdominal lymph nodes (HCC)   Clinical depression   Hypothyroidism   Altered mental status   Type 2 diabetes mellitus without complication, with long-term current use of insulin (HCC)   AMS (altered mental status)    Subjective: Pt is more appropriate today Daughter and wife at bed side Followed commands like moving his arms and legs Was oriented in person/place but not in time  Medications:  . cholecalciferol  2,000 Units Oral Daily  . insulin aspart  0-15 Units Subcutaneous TID WC  . insulin aspart  0-5 Units Subcutaneous QHS  . insulin glargine  8 Units Subcutaneous QHS  . levothyroxine  112 mcg Oral Q0600  . metoprolol succinate  50 mg Oral Daily  . multivitamin with minerals  1 tablet Oral Daily  . pantoprazole  40 mg Oral Daily  . QUEtiapine  50 mg Oral QHS  . rosuvastatin  20 mg Oral Daily  . valACYclovir  1,000 mg Oral BID  . vitamin B-12  1,000 mcg Oral Daily    Objective: Vital signs in last 24 hours: Temp:  [97.9 F (36.6 C)-98.4 F (36.9 C)] 97.9 F (36.6 C) (03/30 0348) Pulse Rate:  [64-78] 78 (03/30 0348) Resp:  [15-21] 18 (03/30 0348) BP: (116-155)/(65-88) 152/88 (03/30 0348) SpO2:  [95 %-100 %] 95 % (03/30 0348) Weight:  [68.1 kg] 68.1 kg (03/29 2016)  PHYSICAL EXAM:  General: awake, legally blind Head: Normocephalic, without obvious abnormality, atraumatic. Eyes: corneal opacity Lips, mucosa, and tongue normal. No Thrush Neck: Supple, symmetrical, no adenopathy, thyroid: non tender no carotid bruit and no JVD. Back: No CVA tenderness. Lungs: Clear to auscultation bilaterally. No Wheezing or Rhonchi. No rales. Heart: Regular rate and rhythm, no murmur, rub or  gallop. Abdomen: Soft, non-tender,not distended. Bowel sounds normal. No masses Extremities: atraumatic, no cyanosis. No edema. No clubbing Skin: No rashes or lesions. Or bruising Lymph: Cervical, supraclavicular normal. Neurologic: Grossly non-focal  Lab Results Recent Labs    10/09/20 0538 10/10/20 0600  WBC 6.1 5.8  HGB 10.7* 11.2*  HCT 31.4* 32.4*  NA 138 137  K 4.0 3.8  CL 104 101  CO2 27 27  BUN 21 22  CREATININE 1.54* 1.59*   Liver Panel Recent Labs    10/08/20 1545  PROT 6.8  ALBUMIN 3.8  AST 28  ALT 27  ALKPHOS 68  BILITOT 0.7  BILIDIR <0.1  IBILI NOT CALCULATED   Sedimentation Rate No results for input(s): ESRSEDRATE in the last 72 hours. C-Reactive Protein No results for input(s): CRP in the last 72 hours.  Microbiology:  Studies/Results: DG Chest Port 1 View  Result Date: 10/08/2020 CLINICAL DATA:  Confusion. EXAM: PORTABLE CHEST 1 VIEW COMPARISON:  September 25, 2020. FINDINGS: The heart size and mediastinal contours are within normal limits. Both lungs are clear. The visualized skeletal structures are unremarkable. IMPRESSION: No active disease. Electronically Signed   By: Marijo Conception M.D.   On: 10/08/2020 15:58   DG FL GUIDED LUMBAR PUNCTURE  Result Date: 10/09/2020 CLINICAL DATA:  Altered mental status. EXAM: DIAGNOSTIC LUMBAR PUNCTURE UNDER FLUOROSCOPIC GUIDANCE COMPARISON:  No prior. FLUOROSCOPY TIME:  Fluoroscopy Time:  2 minutes 54 seconds  Radiation Exposure Index (if provided by the fluoroscopic device): 40.9 mGy PROCEDURE: Informed consent was obtained from the patient prior to the procedure, including potential complications of headache, allergy, bleeding, and pain. With the patient prone, the lower back was prepped with Betadine. 1% Lidocaine was used for local anesthesia. Lumbar puncture was performed at the L3-L4 level using a 22 gauge needle with return of slightly blood tinged CSF which rapidly cleared. 11 ml of CSF were obtained for  laboratory studies. Opening pressure not obtained as CSF just filled the hub of the needle. The patient tolerated the procedure well and there were no apparent complications. IMPRESSION: Successful fluoroscopically guided lumbar puncture. Electronically Signed   By: Marcello Moores  Register   On: 10/09/2020 08:48     Assessment/Plan: 70 year old male with lymphoma was on chemotherapy with rituximab until January 2022 and Revlimid which was stopped on 02 September 2020 because of pancytopenia.  He is presenting with increasing confusion for the past 2 weeks.  CT head shows changes of left cerebellar white matter hypodensity.  Encephalopathy. Lumbar puncture shows mild pleocytosis and increasing RBC.  Was on rituximab which puts him at risk for viral infections including progressive multifocal leukoencephalopathy due to JC virus, herpes, VZV or CMV.  PCR for all of these have been sent.  Until we get the results we can start him on acyclovir IV but he does not have any IV lines and he has been belligerently pulling out all the lines.  Unless we keep him sedated we cannot put a line in him.  So we will do p.o. Valtrex until then.  We will monitor closely his creatinine.  Unfortunately he cannot get an MRI to look for temporal lobe enhancement in herpes encephalitis.  This is because he has metal fragments. EEG to look for any temporal lobe PLEDs could not be done as he pulled the leads out  Work-up for other opportunistic infections like cryptococcus, toxo antibodies have been sent.  CKD  History of SARS-CoV-2 infection in January February 2022.  Consider palliative consult  Spoke to his wife and daughter and hospitalist
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
## 2020-10-10 NOTE — Plan of Care (Signed)
Pt awoke this am anxious unable to reorient safety precautions. IM haldol given. Pt refused am synthroid will retry later  Problem: Coping: Goal: Level of anxiety will decrease Outcome: Not Progressing   Problem: Safety: Goal: Ability to remain free from injury will improve Outcome: Not Progressing   Problem: Education: Goal: Knowledge of General Education information will improve Description: Including pain rating scale, medication(s)/side effects and non-pharmacologic comfort measures Outcome: Progressing   Problem: Health Behavior/Discharge Planning: Goal: Ability to manage health-related needs will improve Outcome: Progressing   Problem: Clinical Measurements: Goal: Ability to maintain clinical measurements within normal limits will improve Outcome: Progressing Goal: Will remain free from infection Outcome: Progressing Goal: Diagnostic test results will improve Outcome: Progressing Goal: Respiratory complications will improve Outcome: Progressing Goal: Cardiovascular complication will be avoided Outcome: Progressing   Problem: Activity: Goal: Risk for activity intolerance will decrease Outcome: Progressing   Problem: Nutrition: Goal: Adequate nutrition will be maintained Outcome: Progressing   Problem: Elimination: Goal: Will not experience complications related to bowel motility Outcome: Progressing Goal: Will not experience complications related to urinary retention Outcome: Progressing   Problem: Pain Managment: Goal: General experience of comfort will improve Outcome: Progressing   Problem: Skin Integrity: Goal: Risk for impaired skin integrity will decrease Outcome: Progressing

## 2020-10-11 ENCOUNTER — Telehealth: Payer: Self-pay | Admitting: Internal Medicine

## 2020-10-11 DIAGNOSIS — Z515 Encounter for palliative care: Secondary | ICD-10-CM | POA: Diagnosis not present

## 2020-10-11 DIAGNOSIS — G9340 Encephalopathy, unspecified: Secondary | ICD-10-CM | POA: Diagnosis not present

## 2020-10-11 DIAGNOSIS — R410 Disorientation, unspecified: Secondary | ICD-10-CM

## 2020-10-11 DIAGNOSIS — G939 Disorder of brain, unspecified: Secondary | ICD-10-CM | POA: Diagnosis not present

## 2020-10-11 DIAGNOSIS — E785 Hyperlipidemia, unspecified: Secondary | ICD-10-CM

## 2020-10-11 DIAGNOSIS — C8590 Non-Hodgkin lymphoma, unspecified, unspecified site: Secondary | ICD-10-CM | POA: Diagnosis not present

## 2020-10-11 LAB — TOXOPLASMA ANTIBODIES- IGG AND  IGM
Toxoplasma Antibody- IgM: <3 AU/mL (ref 0.0–7.9)
Toxoplasma IgG Ratio: 6.4 IU/mL (ref 0.0–7.1)

## 2020-10-11 LAB — COMP PANEL: LEUKEMIA/LYMPHOMA

## 2020-10-11 LAB — GLUCOSE, CAPILLARY
Glucose-Capillary: 163 mg/dL — ABNORMAL HIGH (ref 70–99)
Glucose-Capillary: 144 mg/dL — ABNORMAL HIGH (ref 70–99)
Glucose-Capillary: 163 mg/dL — ABNORMAL HIGH (ref 70–99)
Glucose-Capillary: 101 mg/dL — ABNORMAL HIGH (ref 70–99)

## 2020-10-11 LAB — CRYPTOCOCCUS ANTIGEN, SERUM: Cryptococcus Antigen, Serum: NEGATIVE

## 2020-10-11 MED ORDER — SODIUM CHLORIDE 0.9 % IV SOLN: INTRAVENOUS | Status: DC

## 2020-10-11 MED ORDER — ZIPRASIDONE MESYLATE 20 MG IM SOLR
10.0000 mg | Freq: Once | INTRAMUSCULAR | Status: DC
  Filled 2020-10-11: qty 20

## 2020-10-11 MED ORDER — HALOPERIDOL LACTATE 5 MG/ML IJ SOLN
1.0000 mg | Freq: Four times a day (QID) | INTRAMUSCULAR | Status: DC | PRN
  Administered 2020-10-11 – 2020-10-13 (×5): 1 mg via INTRAVENOUS
  Filled 2020-10-11 (×4): qty 1
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
## 2020-10-11 NOTE — Telephone Encounter (Signed)
On 3/31- I spoke to pt's wife re: concerns of serious brain infection vs lymphoma- which both portend poor prognosis.  However, at this time await diagnosis of pt's delirium/work up in progress.   GB
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
## 2020-10-11 NOTE — Progress Notes (Signed)
ID  Delirium and confusion over 3 week period  Patient Vitals for the past 24 hrs:  BP Temp Pulse Resp SpO2  10/11/20 0826 (!) 157/89 98.3 F (36.8 C) 86 18 99 %  10/10/20 2049 (!) 150/83 97.7 F (36.5 C) 73 18 98 %   Remains fidgety and confused Responds to certain questions appropriately Moves all limbs Chest CTA HSs1s2 Abd soft  labs  CBC Latest Ref Rng & Units 10/10/2020 10/09/2020 10/08/2020  WBC 4.0 - 10.5 K/uL 5.8 6.1 7.0  Hemoglobin 13.0 - 17.0 g/dL 11.2(L) 10.7(L) 11.0(L)  Hematocrit 39.0 - 52.0 % 32.4(L) 31.4(L) 33.6(L)  Platelets 150 - 400 K/uL 212 203 212    CMP Latest Ref Rng & Units 10/10/2020 10/09/2020 10/08/2020  Glucose 70 - 99 mg/dL 200(H) 163(H) 180(H)  BUN 8 - 23 mg/dL 22 21 22   Creatinine 0.61 - 1.24 mg/dL 1.59(H) 1.54(H) 1.73(H)  Sodium 135 - 145 mmol/L 137 138 139  Potassium 3.5 - 5.1 mmol/L 3.8 4.0 4.0  Chloride 98 - 111 mmol/L 101 104 101  CO2 22 - 32 mmol/L 27 27 27   Calcium 8.9 - 10.3 mg/dL 9.2 9.0 9.0  Total Protein 6.5 - 8.1 g/dL - - 6.8  Total Bilirubin 0.3 - 1.2 mg/dL - - 0.7  Alkaline Phos 38 - 126 U/L - - 68  AST 15 - 41 U/L - - 28  ALT 0 - 44 U/L - - 27    CSF HSV-neg VZV neg CMV pending JC virus pending TOXO  Serum  Antibody IgG 6.4 ( < 7 N) Was 3.4 in feb- not sure of the significance of it TOXO PCR  CSF pending Cryptococcus antigen serum negative  Impression/recommendation 70 year old male with lymphoma was on chemotherapy with rituximab until January 2022 and Revlimid which was stopped on 02 September 2020 because of pancytopenia.  He is presenting with increasing confusion for the past 2 weeks.  CT head shows changes of left cerebellar white matter hypodensity.  Encephalopathy. Lumbar puncture shows mild pleocytosis and increasing RBC.  Was on rituximab which puts him at risk for viral infections including progressive multifocal leukoencephalopathy due to JC virus, herpes, VZV or CMV.  PCR for all of these have been sent.  Herpes, VZV negative So will DC valtrex   Work-up for other opportunistic infections like cryptococcus is negative , toxo antibodies IgG 6.4 ( < 7 normal)  - await toxo PCR CSF  May have to go for  brain biopsy of the cerebellar lesion  CKD  History of SARS-CoV-2 infection in January February 2022.  Discussed with hospitalist

## 2020-10-11 NOTE — Progress Notes (Signed)
Patient ID: Mitchell Chang., male   DOB: Sep 20, 1950, 70 y.o.   MRN: [id number] [facility] PROGRESS NOTE  [facility] Her [facility]. HYI:[id number] DOB: 10-26-1950 DOA: 10/08/2020 PCP: Baxter Hire, MD  HPI/Subjective: The patient is still delirious this morning when I saw him.  He is able to follow few simple commands like lifting up his legs on command.  He was naked from the waist down.  He did have a mitten on his left hand.  He was able to recognize his wife's voice and name her by noon.  Admitted on 3/28 with intermittent confusion and found to have a cerebellar mass on CT scan with contrast..  Objective: Vitals:   10/10/20 2049 10/11/20 0826  BP: (!) 150/83 (!) 157/89  Pulse: 73 86  Resp: 18 18  Temp: 97.7 F (36.5 C) 98.3 F (36.8 C)  SpO2: 98% 99%    Intake/Output Summary (Last 24 hours) at 10/11/2020 1408 Last data filed at 10/11/2020 1032 Gross per 24 hour  Intake 240 ml  Output 600 ml  Net -360 ml   Filed Weights   10/09/20 2016  Weight: 68.1 kg    ROS: Review of Systems  Unable to perform ROS: Acuity of condition   Exam: Physical Exam HENT:     Head: Normocephalic.     Mouth/Throat:     Pharynx: No oropharyngeal exudate.  Eyes:     General: Lids are normal.     Comments: Pupils clouded over.  Cardiovascular:     Rate and Rhythm: Normal rate and regular rhythm.     Heart sounds: Normal heart sounds, S1 normal and S2 normal.  Pulmonary:     Breath sounds: Normal breath sounds. No decreased breath sounds, wheezing, rhonchi or rales.  Abdominal:     Palpations: Abdomen is soft.     Tenderness: There is no abdominal tenderness.  Musculoskeletal:     Right ankle: No swelling.     Left ankle: No swelling.  Skin:    General: Skin is warm.     Findings: No rash.  Neurological:     Mental Status: He is confused.     Comments: Patient did lift up his legs to verbal command.       Data Reviewed: Basic Metabolic Panel: Recent Labs  Lab  10/08/20 1128 10/08/20 1545 10/09/20 0538 10/10/20 0600  NA 139  --  138 137  K 4.0  --  4.0 3.8  CL 101  --  104 101  CO2 27  --  27 27  GLUCOSE 180*  --  163* 200*  BUN 22  --  21 22  CREATININE 1.73*  --  1.54* 1.59*  CALCIUM 9.0  --  9.0 9.2  MG  --  2.0  --   --   PHOS  --  3.5  --   --    Liver Function Tests: Recent Labs  Lab 10/08/20 1545  AST 28  ALT 27  ALKPHOS 68  BILITOT 0.7  PROT 6.8  ALBUMIN 3.8    Recent Labs  Lab 10/08/20 1545  AMMONIA 10   CBC: Recent Labs  Lab 10/08/20 1128 10/09/20 0538 10/10/20 0600  WBC 7.0 6.1 5.8  NEUTROABS 5.3  --   --   HGB 11.0* 10.7* 11.2*  HCT 33.6* 31.4* 32.4*  MCV 104.3* 102.3* 100.9*  PLT 212 203 212   BNP (last 3 results) Recent Labs    09/13/20 1056  BNP 84.3  CBG: Recent Labs  Lab 10/10/20 1159 10/10/20 1554 10/10/20 2118 10/11/20 0829 10/11/20 1155  GLUCAP 207* 125* 191* 163* 144*    Recent Results (from the past 240 hour(s))  Urine culture     Status: None   Collection Time: 10/04/20  9:48 AM   Specimen: Urine, Clean Catch  Result Value Ref Range Status   Specimen Description   Final    URINE, CLEAN CATCH Performed at [facility], [address], [place], [place] [address]    Special Requests   Final    NONE Performed at [facility] [address] [place], [place] [address]    Culture   Final    NO GROWTH Performed at [facility], [place] [address] [place], [place] [address]    Report Status 10/05/2020 FINAL  Final  CULTURE, BLOOD (ROUTINE X 2) w Reflex to ID Panel     Status: None (Preliminary result)   Collection Time: 10/08/20  3:45 PM   Specimen: BLOOD  Result Value Ref Range Status   Specimen Description BLOOD RIGHT ANTECUBITAL  Final   Special Requests   Final    BOTTLES DRAWN AEROBIC AND ANAEROBIC Blood Culture adequate volume   Culture   Final    NO GROWTH 3 DAYS Performed at [facility], [address], [place], [place]  [address]    Report Status PENDING  Incomplete  CULTURE, BLOOD (ROUTINE X 2) w Reflex to ID Panel     Status: None (Preliminary result)   Collection Time: 10/08/20  3:45 PM   Specimen: BLOOD  Result Value Ref Range Status   Specimen Description BLOOD LEFT ANTECUBITAL  Final   Special Requests   Final    BOTTLES DRAWN AEROBIC AND ANAEROBIC Blood Culture adequate volume   Culture   Final    NO GROWTH 3 DAYS Performed at [facility], [place], [place] [address]    Report Status PENDING  Incomplete  SARS CORONAVIRUS 2 (TAT 6-24 HRS) Nasopharyngeal Nasopharyngeal Swab     Status: Abnormal   Collection Time: 10/08/20  5:52 PM   Specimen: Nasopharyngeal Swab  Result Value Ref Range Status   SARS Coronavirus 2 POSITIVE (A) NEGATIVE Final    Comment: (NOTE) SARS-CoV-2 target nucleic acids are DETECTED.  The SARS-CoV-2 RNA is generally detectable in upper and lower respiratory specimens during the acute phase of infection. Positive results are indicative of the presence of SARS-CoV-2 RNA. Clinical correlation with patient history and other diagnostic information is  necessary to determine patient infection status. Positive results do not rule out bacterial infection or co-infection with other viruses.  The expected result is Negative.  Fact Sheet for Patients: SugarRoll.be  Fact Sheet for Healthcare Providers: https://www.woods-mathews.com/  This test is not yet approved or cleared by the [place] FDA and  has been authorized for detection and/or diagnosis of SARS-CoV-2 by FDA under an Emergency Use Authorization (EUA). This EUA will remain  in effect (meaning this test can be used) for the duration of the COVID-19 declaration under Section 564(b)(1) of the Act, 21 U. S.C. section 360bbb-3(b)(1), unless the authorization is terminated or revoked sooner.   Performed at [facility], [place] [address]  [place],  [address]   Culture, fungus without smear     Status: None (Preliminary result)   Collection Time: 10/09/20  8:04 AM   Specimen: CSF; Cerebrospinal Fluid  Result Value Ref Range Status   Specimen Description  Final    CSF Performed at [facility], [address] [place], [place] [address]    Special Requests   Final    NONE Performed at [facility], [address], [place], [place] [address]    Culture   Final    NO FUNGUS ISOLATED AFTER 2 DAYS Performed at [facility], [place] [address], [place], [place] [address]    Report Status PENDING  Incomplete  CSF culture w Gram Stain     Status: None (Preliminary result)   Collection Time: 10/09/20  8:04 AM   Specimen: PATH Cytology CSF; Cerebrospinal Fluid  Result Value Ref Range Status   Specimen Description   Final    CSF Performed at [facility], [address] [place], [place] [address]    Special Requests   Final    NONE Performed at [facility], [place] [place], [place] [address]    Gram Stain   Final    CYTOSPIN SLIDE WBC PRESENT, PREDOMINANTLY MONONUCLEAR RBCS PRESENT NO ORGANISMS SEEN Performed at [facility], [address] [place], [place] [address]    Culture   Final    NO GROWTH 2 DAYS Performed at [facility], [place] [address] [place], [place] [address]    Report Status PENDING  Incomplete      Scheduled Meds: . cholecalciferol  2,000 Units Oral Daily  . enoxaparin (LOVENOX) injection  40 mg Subcutaneous Q24H  . insulin aspart  0-15 Units Subcutaneous TID WC  . insulin aspart  0-5 Units Subcutaneous QHS  . insulin glargine  8 Units Subcutaneous QHS  . levothyroxine  50 mcg Oral Q0600  . metoprolol succinate  50 mg Oral Daily  . multivitamin with minerals  1 tablet Oral Daily  . pantoprazole  40 mg Oral Daily  . QUEtiapine  50 mg Oral QHS  . rosuvastatin  20 mg Oral Daily  . valACYclovir  1,000 mg Oral BID  .  vitamin B-12  1,000 mcg Oral Daily   Continuous Infusions: . sodium chloride 75 mL/hr at 10/11/20 1210    Assessment/Plan:  1. Acute delirium.  Continue as needed Haldol and Seroquel at night.  Mental status has not improved since yesterday.  We will start IV fluids if able to keep IV and without ripping it out.  Unable to complete EEG yesterday 2. 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  On Valtrex for now.  Cytology still pending.  If cytology does not help give a diagnosis then may have to consider brain biopsy.  Could potentially be lymphoma versus PML 3. 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  Continue glargine insulin and sliding scale 4. Hyperlipidemia unspecified.  Will hold Crestor at this time 5. Hypothyroidism unspecified on levothyroxine.  TSH still in the hyperthyroid range continue lower dose levothyroxine 50 mcg daily 6. Essential hypertension on metoprolol 7. Blind in both eyes 8. B12 deficiency on oral B12        Code Status:     Code Status Orders  (From admission, onward)         Start     Ordered   10/08/20 1612  Do not attempt resuscitation (DNR)  Continuous       Question Answer Comment  In the event of cardiac or respiratory ARREST Do not call a "code blue"   In  the event of cardiac or respiratory ARREST Do not perform Intubation, CPR, defibrillation or ACLS   In the event of cardiac or respiratory ARREST Use medication by any route, position, wound care, and other measures to relive pain and suffering. May use oxygen, suction and manual treatment of airway obstruction as needed for comfort.      10/08/20 1612        Code Status History    Date Active Date Inactive Code Status Order ID Comments User Context   10/08/2020 1511 10/08/2020 1612 Full Code [id number]  Criss Alvine, DO Inpatient   09/13/2020 1851 09/15/2020 1745 Full Code [id number]  Rhetta Mura  DO Inpatient   08/13/2020 1504 08/16/2020 1750 Full Code [id number]  CoxBriant Cedar, DO ED   Advance Care Planning Activity    Advance Directive Documentation   Flowsheet Row Most Recent Value  Type of Advance Directive Living will  Pre-existing out of facility DNR order (yellow form or pink MOST form) --  "MOST" Form in Place? --     Family Communication: Spoke with wife at the bedside.  I also spoke with the patient's daughter Marzetta Board on the phone at [phone] Disposition Plan: Status is: Inpatient  Dispo: The patient is from: Home              Anticipated d/c is to: Unclear at this point              Patient currently still with acute delirium.  Work-up for brain mass still in process   Difficult to place patient.  Hopefully not.  Consultants:  Infectious disease  Neurology  Oncology  Palliative care  Procedures:  Lumbar puncture  Antibiotics:  Valtrex  Time spent: 37 minutes, case discussed with palliative care  [place]  [facility]
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
## 2020-10-12 DIAGNOSIS — G9389 Other specified disorders of brain: Secondary | ICD-10-CM

## 2020-10-12 DIAGNOSIS — Z515 Encounter for palliative care: Secondary | ICD-10-CM | POA: Diagnosis not present

## 2020-10-12 DIAGNOSIS — Z794 Long term (current) use of insulin: Secondary | ICD-10-CM

## 2020-10-12 DIAGNOSIS — N184 Chronic kidney disease, stage 4 (severe): Secondary | ICD-10-CM | POA: Diagnosis not present

## 2020-10-12 DIAGNOSIS — E1165 Type 2 diabetes mellitus with hyperglycemia: Secondary | ICD-10-CM

## 2020-10-12 DIAGNOSIS — C8223 Follicular lymphoma grade III, unspecified, intra-abdominal lymph nodes: Secondary | ICD-10-CM

## 2020-10-12 DIAGNOSIS — R404 Transient alteration of awareness: Secondary | ICD-10-CM | POA: Diagnosis not present

## 2020-10-12 DIAGNOSIS — R4182 Altered mental status, unspecified: Secondary | ICD-10-CM

## 2020-10-12 LAB — CBC WITH DIFFERENTIAL/PLATELET
Abs Immature Granulocytes: 0.03 10*3/uL (ref 0.00–0.07)
Basophils Absolute: 0.1 10*3/uL (ref 0.0–0.1)
Basophils Relative: 1 %
Eosinophils Absolute: 0.1 10*3/uL (ref 0.0–0.5)
Eosinophils Relative: 1 %
HCT: 29.9 % — ABNORMAL LOW (ref 39.0–52.0)
Hemoglobin: 10.3 g/dL — ABNORMAL LOW (ref 13.0–17.0)
Immature Granulocytes: 0 %
Lymphocytes Relative: 11 %
Lymphs Abs: 0.8 10*3/uL (ref 0.7–4.0)
MCH: 34.9 pg — ABNORMAL HIGH (ref 26.0–34.0)
MCHC: 34.4 g/dL (ref 30.0–36.0)
MCV: 101.4 fL — ABNORMAL HIGH (ref 80.0–100.0)
Monocytes Absolute: 0.9 10*3/uL (ref 0.1–1.0)
Monocytes Relative: 12 %
Neutro Abs: 5.5 10*3/uL (ref 1.7–7.7)
Neutrophils Relative %: 75 %
Platelets: 199 10*3/uL (ref 150–400)
RBC: 2.95 MIL/uL — ABNORMAL LOW (ref 4.22–5.81)
RDW: 18.6 % — ABNORMAL HIGH (ref 11.5–15.5)
WBC: 7.4 10*3/uL (ref 4.0–10.5)
nRBC: 0 % (ref 0.0–0.2)

## 2020-10-12 LAB — COMPREHENSIVE METABOLIC PANEL
ALT: 24 U/L (ref 0–44)
AST: 33 U/L (ref 15–41)
Albumin: 3.6 g/dL (ref 3.5–5.0)
Alkaline Phosphatase: 68 U/L (ref 38–126)
Anion gap: 10 (ref 5–15)
BUN: 19 mg/dL (ref 8–23)
CO2: 23 mmol/L (ref 22–32)
Calcium: 8.9 mg/dL (ref 8.9–10.3)
Chloride: 108 mmol/L (ref 98–111)
Creatinine, Ser: 1.69 mg/dL — ABNORMAL HIGH (ref 0.61–1.24)
GFR, Estimated: 43 mL/min — ABNORMAL LOW (ref >60)
Glucose, Bld: 113 mg/dL — ABNORMAL HIGH (ref 70–99)
Potassium: 3.7 mmol/L (ref 3.5–5.1)
Sodium: 141 mmol/L (ref 135–145)
Total Bilirubin: 0.9 mg/dL (ref 0.3–1.2)
Total Protein: 6.1 g/dL — ABNORMAL LOW (ref 6.5–8.1)

## 2020-10-12 LAB — CSF CULTURE W GRAM STAIN: Culture: NO GROWTH

## 2020-10-12 LAB — METHYLMALONIC ACID, SERUM: Methylmalonic Acid, Quantitative: 236 nmol/L (ref 0–378)

## 2020-10-12 LAB — GLUCOSE, CAPILLARY
Glucose-Capillary: 130 mg/dL — ABNORMAL HIGH (ref 70–99)
Glucose-Capillary: 154 mg/dL — ABNORMAL HIGH (ref 70–99)
Glucose-Capillary: 162 mg/dL — ABNORMAL HIGH (ref 70–99)
Glucose-Capillary: 112 mg/dL — ABNORMAL HIGH (ref 70–99)

## 2020-10-12 LAB — CMV DNA, QUANTITATIVE, PCR
CMV DNA Quant: NEGATIVE IU/mL
Log10 CMV Qn DNA Pl: UNDETERMINED log10 IU/mL

## 2020-10-12 LAB — JC VIRUS DNA,PCR (WHOLE BLOOD): JC Virus DNA, PCR, Blood: POSITIVE — AB

## 2020-10-12 LAB — CYTOLOGY - NON PAP
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
## 2020-10-12 NOTE — Progress Notes (Signed)
Patient ID: Mitchell Chang., male   DOB: Oct 26, 1950, 70 y.o.   MRN: [id number] [facility] PROGRESS NOTE  Gwen Her [facility]. BLT:[id number] DOB: 02-Apr-1951 DOA: 10/08/2020 PCP: Baxter Hire, MD  HPI/Subjective: Patient does answer some questions but still agitated with his movements in the bed with his arms.  Able to focus him but then he goes back to his agitated movements with his blankets in the bed.  Patient admitted with altered mental status and found to have a cerebellar mass on CT scan with contrast.  Objective: Vitals:   10/12/20 0750 10/12/20 1244  BP: (!) 160/90 (!) 173/91  Pulse: (!) 101 89  Resp:  18  Temp:  98.2 F (36.8 C)  SpO2:  99%    Intake/Output Summary (Last 24 hours) at 10/12/2020 1514 Last data filed at 10/12/2020 1417 Gross per 24 hour  Intake 120 ml  Output 2 ml  Net 118 ml   Filed Weights   10/09/20 2016  Weight: 68.1 kg    ROS: Review of Systems  Unable to perform ROS: Acuity of condition  Respiratory: Negative for shortness of breath.   Cardiovascular: Negative for chest pain.  Gastrointestinal: Negative for abdominal pain.   Exam: Physical Exam HENT:     Head: Normocephalic.     Mouth/Throat:     Pharynx: No oropharyngeal exudate.  Eyes:     General: Lids are normal.  Cardiovascular:     Rate and Rhythm: Normal rate and regular rhythm.     Heart sounds: Normal heart sounds, S1 normal and S2 normal.  Pulmonary:     Breath sounds: Normal breath sounds. No decreased breath sounds, wheezing, rhonchi or rales.  Abdominal:     Palpations: Abdomen is soft.     Tenderness: There is no abdominal tenderness.  Musculoskeletal:     Right lower leg: No swelling.     Left lower leg: No swelling.  Skin:    General: Skin is warm.     Findings: No rash.  Neurological:     Mental Status: He is confused.     Comments: Patient does have some agitation with his hands.  Able to move his extremities on his own and follow some commands when  I am able to focus him.       Data Reviewed: Basic Metabolic Panel: Recent Labs  Lab 10/08/20 1128 10/08/20 1545 10/09/20 0538 10/10/20 0600 10/12/20 0452  NA 139  --  138 137 141  K 4.0  --  4.0 3.8 3.7  CL 101  --  104 101 108  CO2 27  --  27 27 23   GLUCOSE 180*  --  163* 200* 113*  BUN 22  --  21 22 19   CREATININE 1.73*  --  1.54* 1.59* 1.69*  CALCIUM 9.0  --  9.0 9.2 8.9  MG  --  2.0  --   --   --   PHOS  --  3.5  --   --   --    Liver Function Tests: Recent Labs  Lab 10/08/20 1545 10/12/20 0452  AST 28 33  ALT 27 24  ALKPHOS 68 68  BILITOT 0.7 0.9  PROT 6.8 6.1*  ALBUMIN 3.8 3.6    Recent Labs  Lab 10/08/20 1545  AMMONIA 10   CBC: Recent Labs  Lab 10/08/20 1128 10/09/20 0538 10/10/20 0600 10/12/20 0452  WBC 7.0 6.1 5.8 7.4  NEUTROABS 5.3  --   --  5.5  HGB 11.0* 10.7* 11.2* 10.3*  HCT 33.6* 31.4* 32.4* 29.9*  MCV 104.3* 102.3* 100.9* 101.4*  PLT 212 203 212 199   BNP (last 3 results) Recent Labs    09/13/20 1056  BNP 84.3      CBG: Recent Labs  Lab 10/11/20 1155 10/11/20 1657 10/11/20 2133 10/12/20 0756 10/12/20 1149  GLUCAP 144* 163* 101* 130* 154*    Recent Results (from the past 240 hour(s))  Urine culture     Status: None   Collection Time: 10/04/20  9:48 AM   Specimen: Urine, Clean Catch  Result Value Ref Range Status   Specimen Description   Final    URINE, CLEAN CATCH Performed at [facility], [address], [place], [place] [address]    Special Requests   Final    NONE Performed at [facility], [address], [place], [place] [address]    Culture   Final    NO GROWTH Performed at [facility], [place] [address] [place], [place] [address]    Report Status 10/05/2020 FINAL  Final  CULTURE, BLOOD (ROUTINE X 2) w Reflex to ID Panel     Status: None (Preliminary result)   Collection Time: 10/08/20  3:45 PM   Specimen: BLOOD  Result Value Ref Range Status   Specimen Description BLOOD RIGHT  ANTECUBITAL  Final   Special Requests   Final    BOTTLES DRAWN AEROBIC AND ANAEROBIC Blood Culture adequate volume   Culture   Final    NO GROWTH 4 DAYS Performed at [facility], [address], [place], [place] [address]    Report Status PENDING  Incomplete  CULTURE, BLOOD (ROUTINE X 2) w Reflex to ID Panel     Status: None (Preliminary result)   Collection Time: 10/08/20  3:45 PM   Specimen: BLOOD  Result Value Ref Range Status   Specimen Description BLOOD LEFT ANTECUBITAL  Final   Special Requests   Final    BOTTLES DRAWN AEROBIC AND ANAEROBIC Blood Culture adequate volume   Culture   Final    NO GROWTH 4 DAYS Performed at [facility], [place], [place] [address]    Report Status PENDING  Incomplete  SARS CORONAVIRUS 2 (TAT 6-24 HRS) Nasopharyngeal Nasopharyngeal Swab     Status: Abnormal   Collection Time: 10/08/20  5:52 PM   Specimen: Nasopharyngeal Swab  Result Value Ref Range Status   SARS Coronavirus 2 POSITIVE (A) NEGATIVE Final    Comment: (NOTE) SARS-CoV-2 target nucleic acids are DETECTED.  The SARS-CoV-2 RNA is generally detectable in upper and lower respiratory specimens during the acute phase of infection. Positive results are indicative of the presence of SARS-CoV-2 RNA. Clinical correlation with patient history and other diagnostic information is  necessary to determine patient infection status. Positive results do not rule out bacterial infection or co-infection with other viruses.  The expected result is Negative.  Fact Sheet for Patients: SugarRoll.be  Fact Sheet for Healthcare Providers: https://www.woods-mathews.com/  This test is not yet approved or cleared by the [place] FDA and  has been authorized for detection and/or diagnosis of SARS-CoV-2 by FDA under an Emergency Use Authorization (EUA). This EUA will remain  in effect (meaning this test can be used) for the  duration of the COVID-19 declaration under Section 564(b)(1) of the Act, 21 U. S.C. section 360bbb-3(b)(1), unless the authorization is terminated or revoked sooner.   Performed at [facility], [address] [place], [place] [address]   Culture, fungus without smear     Status: None (Preliminary result)   Collection Time: 10/09/20  8:04 AM   Specimen: CSF; Cerebrospinal Fluid  Result Value Ref Range Status   Specimen Description   Final    CSF Performed at [facility], [address] [place], [place] [address]    Special Requests   Final    NONE Performed at [facility], [address], [place], [place] [address]    Culture   Final    NO FUNGUS ISOLATED AFTER 3 DAYS Performed at [facility], [place] [address] [place], [place] [address]    Report Status PENDING  Incomplete  CSF culture w Gram Stain     Status: None   Collection Time: 10/09/20  8:04 AM   Specimen: PATH Cytology CSF; Cerebrospinal Fluid  Result Value Ref Range Status   Specimen Description   Final    CSF Performed at [facility], [address], [place], [place] [address]    Special Requests   Final    NONE Performed at [facility], [address] [place], [place] [address]    Gram Stain   Final    CYTOSPIN SLIDE WBC PRESENT, PREDOMINANTLY MONONUCLEAR RBCS PRESENT NO ORGANISMS SEEN Performed at [facility], [address], [place], [place] [address]    Culture   Final    NO GROWTH 3 DAYS Performed at [facility], [place] [address], [place], [place] [address]    Report Status 10/12/2020 FINAL  Final      Scheduled Meds: . enoxaparin (LOVENOX) injection  40 mg Subcutaneous Q24H  . insulin aspart  0-15 Units Subcutaneous TID WC  . insulin aspart  0-5 Units Subcutaneous QHS  . insulin glargine  8 Units Subcutaneous QHS  . levothyroxine  50 mcg Oral Q0600  . metoprolol succinate  50 mg Oral Daily  . pantoprazole  40 mg Oral  Daily  . QUEtiapine  50 mg Oral QHS  . vitamin B-12  1,000 mcg Oral Daily  . ziprasidone  10 mg Intramuscular Once   Continuous Infusions: . sodium chloride 75 mL/hr at 10/12/20 1504    Assessment/Plan:  1. Acute delirium persist.  Continue as needed Haldol and Seroquel at night.  Continue IV fluids.  Mental status has not improved since admission. 2. 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  Patient has a history of follicular lymphoma.  Cytology still pending.  JC virus in CSF still pending.  As per neurology note, JC virus positive in serum is not diagnostic of PML.  Need to wait for JC virus and CSF.  Family would like to have a diagnosis before deciding on next steps. 3. 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  Patient on low-dose glargine insulin and sliding scale. 4. Hyperlipidemia unspecified.  Continue to hold Crestor at this time 5. Hypothyroidism some unspecified on levothyroxine.  TSH still in the hyperthyroid range.  On a lower dose of levothyroxine 50 mcg daily 6. Essential hypertension on metoprolol 7. B12 deficiency on oral supplementation 8. Blind in both eyes     Code Status:     Code Status Orders  (From admission, onward)         Start     Ordered   10/08/20 1612  Do not attempt resuscitation (DNR)  Continuous  Question Answer Comment  In the event of cardiac or respiratory ARREST Do not call a "code blue"   In the event of cardiac or respiratory ARREST Do not perform Intubation, CPR, defibrillation or ACLS   In the event of cardiac or respiratory ARREST Use medication by any route, position, wound care, and other measures to relive pain and suffering. May use oxygen, suction and manual treatment of airway obstruction as needed for comfort.      10/08/20 1612        Code Status History    Date Active Date Inactive Code Status Order ID Comments User Context   10/08/2020 1511 10/08/2020 1612 Full Code  [id number]  Criss Alvine, DO Inpatient   09/13/2020 1851 09/15/2020 1745 Full Code [id number]  Rhetta Mura DO Inpatient   08/13/2020 1504 08/16/2020 1750 Full Code [id number]  CoxBriant Cedar, DO ED   Advance Care Planning Activity    Advance Directive Documentation   Flowsheet Row Most Recent Value  Type of Advance Directive Living will  Pre-existing out of facility DNR order (yellow form or pink MOST form) --  "MOST" Form in Place? --     Family Communication: Spoke with wife at the bedside Disposition Plan: Status is: Inpatient  Dispo: The patient is from: Home              Anticipated d/c is to: Yet to be determined based on clinical course              Patient currently still with acute delirium and impaired mental status.  Likely will not be able to go home.   Difficult to place patient.  Hopefully not.  Time spent: 28 minutes  [place]
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
## 2020-10-12 NOTE — Progress Notes (Signed)
ID Pt has JC virus in the blood Csf pending Hospice care now  ID will sign off

## 2020-10-12 NOTE — Progress Notes (Signed)
   10/12/20 1650  Clinical Encounter Type  Visited With Patient and family together  Visit Type Initial;Spiritual support;Social support  Referral From Nurse  Consult/Referral To Chaplain  Responded to PG from nurse. Met with Pt and family. Had some good conversations with Pt and family. I provided the ministry of presence and comfort. Ch will follow up later.   
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
## 2020-10-12 NOTE — Progress Notes (Signed)
[facility]) Hospital Liaison RN note:  Received request from Altha Harm, NP for family interest in [place]. Chart in review and eligibility is pending. Unfortunately, [facility] does not have a room to offer today. Doran Clay, TOC is aware. If a room becomes available over the weekend, the [facility] will reach out to weekend [facility] to make arrangements for transfer.  Please call with any hospice related questions or concerns.  Thank you for the opportunity to participate in this patient's care.  Zandra Abts, RN [facility] Liaison [phone]
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
## 2020-10-12 NOTE — Assessment & Plan Note (Signed)
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  LP/CSF-JC virus pending.  Blood PCR-JC virus positive  #Follicle lymphoma-status post rituximab-lenalidomide; currently on hold because of acute medical issues.  #CKD/poorly controlled diabetes  #DNR/DNI  Recommendations:  #Given most likely cause of acute delirium being intracranial pathology-lymphoma versus JC virus infection/PML-which carries poor prognosis, I think it is reasonable to de-escalate work-up/and focus on best supportive care/hospice.  Patient is not a candidate any therapies.  I had a long discussion with the patient's wife/daughter.  Given the expected significant decline in quality of life/and with limited/no treatment options available hospice is very reasonable.  Recommend hospice home given the patient's complexity of care involved.  Family is in agreement.  Discussed with Josh Borders; Drs.Weiting, with neurologist and ID specialist.   # 70 minutes face-to-face with the patient's wife/family discussing the above plan of care; more than 50% of time spent on prognosis/ natural history; counseling and coordination.

## 2020-10-13 LAB — CULTURE, BLOOD (ROUTINE X 2)
Culture: NO GROWTH
Culture: NO GROWTH
Special Requests: ADEQUATE
Special Requests: ADEQUATE

## 2020-10-13 LAB — GLUCOSE, CAPILLARY
Glucose-Capillary: 137 mg/dL — ABNORMAL HIGH (ref 70–99)
Glucose-Capillary: 144 mg/dL — ABNORMAL HIGH (ref 70–99)

## 2020-10-13 MED ORDER — GLYCOPYRROLATE 0.2 MG/ML IJ SOLN
0.2000 mg | INTRAMUSCULAR | Status: DC | PRN
  Administered 2020-10-13: 18:00:00 0.2 mg via INTRAVENOUS
  Filled 2020-10-13: qty 1

## 2020-10-13 MED ORDER — CHLORHEXIDINE GLUCONATE CLOTH 2 % EX PADS
6.0000 | MEDICATED_PAD | Freq: Every day | CUTANEOUS | Status: DC
  Administered 2020-10-13: 6 via TOPICAL

## 2020-10-13 MED ORDER — MORPHINE SULFATE (CONCENTRATE) 10 MG/0.5ML PO SOLN: 10.0000 mg | ORAL | Status: DC | PRN

## 2020-10-13 MED ORDER — LORAZEPAM 2 MG/ML IJ SOLN
1.0000 mg | INTRAMUSCULAR | Status: DC | PRN
  Administered 2020-10-13 – 2020-10-15 (×6): 1 mg via INTRAVENOUS
  Filled 2020-10-13 (×6): qty 1

## 2020-10-13 MED ORDER — GLYCOPYRROLATE 0.2 MG/ML IJ SOLN: 0.2000 mg | INTRAMUSCULAR | Status: DC | PRN

## 2020-10-13 MED ORDER — GLYCOPYRROLATE 1 MG PO TABS
1.0000 mg | ORAL_TABLET | ORAL | Status: DC | PRN
  Filled 2020-10-13: qty 1

## 2020-10-13 MED ORDER — LORAZEPAM 2 MG/ML IJ SOLN
1.0000 mg | Freq: Four times a day (QID) | INTRAMUSCULAR | Status: DC | PRN
  Administered 2020-10-13: 1 mg via INTRAVENOUS
  Filled 2020-10-13: qty 1

## 2020-10-13 MED ORDER — POLYVINYL ALCOHOL 1.4 % OP SOLN
1.0000 [drp] | Freq: Four times a day (QID) | OPHTHALMIC | Status: DC | PRN
  Filled 2020-10-13: qty 15

## 2020-10-13 MED ORDER — BIOTENE DRY MOUTH MT LIQD: 15.0000 mL | OROMUCOSAL | Status: DC | PRN

## 2020-10-13 NOTE — TOC Progression Note (Signed)
Transition of Care (TOC) - Progression Note    Patient Details  Name: Bj Morlock. MRN: [id number] Date of Birth: 01-22-1951  Transition of Care [facility]) CM/SW Contact  Zigmund Daniel Dorian Pod, RN Phone [phone] 10/13/2020, 10:58 AM  Clinical Narrative:     [facility] RN followed up with Latanya Presser ([place]) on pt's eligibility and bed availability for the [facility] hospice home. Representative will inquire further and call back for confirmation on pt's disposition with placement.  TOC will continue to follow for discharge planning needs.  Expected Discharge Plan:  (Unsure of discharge diposition at this time) Barriers to Discharge: Continued Medical Work up  Expected Discharge Plan and Services Expected Discharge Plan:  (Unsure of discharge diposition at this time)   Discharge Planning Services: CM Consult   Living arrangements for the past 2 months: Single Family Home                 DME Arranged: N/A         HH Arranged: NA           Social Determinants of Health (SDOH) Interventions    Readmission Risk Interventions Readmission Risk Prevention Plan 10/11/2020  Transportation Screening Complete  PCP or Specialist Appt within 5-7 Days Not Complete  Not Complete comments unsure of prognosis or discharge disposition  Home Care Screening Complete  Medication Review (RN CM) Complete  Some recent data might be hidden

## 2020-10-13 NOTE — Progress Notes (Signed)
Patient ID: Mitchell Chang., male   DOB: [birth year], 70 y.o.   MRN: [id number] [facility] PROGRESS NOTE  Gwen Her [facility]. JJK:[id number] DOB: 1950/10/05 DOA: 10/08/2020 PCP: Baxter Hire, MD  HPI/Subjective: Patient seen today and was still agitated trying to pull the blanket up and moving his arms and legs.  I was able to talk with him and he was able to follow some commands.  Foley catheter needed to be placed for urinary retention.  Since family interested in hospice home I added IV Ativan also for agitation.  Patient did not eat breakfast this morning.  Objective: Vitals:   10/13/20 0719 10/13/20 1134  BP: (!) 149/76 (!) 156/63  Pulse: 92 72  Resp: 17 20  Temp: 97.6 F (36.4 C) 98.3 F (36.8 C)  SpO2:  99%    Intake/Output Summary (Last 24 hours) at 10/13/2020 1216 Last data filed at 10/13/2020 3716 Gross per 24 hour  Intake 120 ml  Output 475 ml  Net -355 ml   Filed Weights   10/09/20 2016  Weight: 68.1 kg    ROS: Review of Systems  Unable to perform ROS: Acuity of condition  Respiratory: Negative for shortness of breath.   Cardiovascular: Negative for chest pain.  Gastrointestinal: Negative for abdominal pain.   Exam: Physical Exam HENT:     Head: Normocephalic.     Mouth/Throat:     Pharynx: No oropharyngeal exudate.  Eyes:     General: Lids are normal.  Cardiovascular:     Rate and Rhythm: Normal rate and regular rhythm.     Heart sounds: Normal heart sounds, S1 normal and S2 normal.  Pulmonary:     Breath sounds: Normal breath sounds. No decreased breath sounds, wheezing, rhonchi or rales.  Abdominal:     Palpations: Abdomen is soft.     Tenderness: There is no abdominal tenderness.  Musculoskeletal:     Right lower leg: No swelling.     Left lower leg: No swelling.  Skin:    General: Skin is warm.     Findings: No rash.  Neurological:     Mental Status: He is confused.     Comments: Moves his arms and legs on his own  Psychiatric:         Behavior: Behavior is agitated.       Data Reviewed: Basic Metabolic Panel: Recent Labs  Lab 10/08/20 1128 10/08/20 1545 10/09/20 0538 10/10/20 0600 10/12/20 0452  NA 139  --  138 137 141  K 4.0  --  4.0 3.8 3.7  CL 101  --  104 101 108  CO2 27  --  27 27 23   GLUCOSE 180*  --  163* 200* 113*  BUN 22  --  21 22 19   CREATININE 1.73*  --  1.54* 1.59* 1.69*  CALCIUM 9.0  --  9.0 9.2 8.9  MG  --  2.0  --   --   --   PHOS  --  3.5  --   --   --    Liver Function Tests: Recent Labs  Lab 10/08/20 1545 10/12/20 0452  AST 28 33  ALT 27 24  ALKPHOS 68 68  BILITOT 0.7 0.9  PROT 6.8 6.1*  ALBUMIN 3.8 3.6    Recent Labs  Lab 10/08/20 1545  AMMONIA 10   CBC: Recent Labs  Lab 10/08/20 1128 10/09/20 0538 10/10/20 0600 10/12/20 0452  WBC 7.0 6.1 5.8 7.4  NEUTROABS 5.3  --   --  5.5  HGB 11.0* 10.7* 11.2* 10.3*  HCT 33.6* 31.4* 32.4* 29.9*  MCV 104.3* 102.3* 100.9* 101.4*  PLT 212 203 212 199   BNP (last 3 results) Recent Labs    09/13/20 1056  BNP 84.3    CBG: Recent Labs  Lab 10/12/20 1149 10/12/20 1559 10/12/20 2111 10/13/20 0713 10/13/20 1136  GLUCAP 154* 162* 112* 137* 144*    Recent Results (from the past 240 hour(s))  Urine culture     Status: None   Collection Time: 10/04/20  9:48 AM   Specimen: Urine, Clean Catch  Result Value Ref Range Status   Specimen Description   Final    URINE, CLEAN CATCH Performed at [facility], [address], [place], [place] [address]    Special Requests   Final    NONE Performed at [facility], [address], [place], [place] [address]    Culture   Final    NO GROWTH Performed at [facility], [place] [address], [place], [place] [address]    Report Status 10/05/2020 FINAL  Final  CULTURE, BLOOD (ROUTINE X 2) w Reflex to ID Panel     Status: None   Collection Time: 10/08/20  3:45 PM   Specimen: BLOOD  Result Value Ref Range Status   Specimen Description BLOOD RIGHT  ANTECUBITAL  Final   Special Requests   Final    BOTTLES DRAWN AEROBIC AND ANAEROBIC Blood Culture adequate volume   Culture   Final    NO GROWTH 5 DAYS Performed at [facility], [place] [place], [place] [address]    Report Status 10/13/2020 FINAL  Final  CULTURE, BLOOD (ROUTINE X 2) w Reflex to ID Panel     Status: None   Collection Time: 10/08/20  3:45 PM   Specimen: BLOOD  Result Value Ref Range Status   Specimen Description BLOOD LEFT ANTECUBITAL  Final   Special Requests   Final    BOTTLES DRAWN AEROBIC AND ANAEROBIC Blood Culture adequate volume   Culture   Final    NO GROWTH 5 DAYS Performed at [facility], [address] [place], [place] [address]    Report Status 10/13/2020 FINAL  Final  SARS CORONAVIRUS 2 (TAT 6-24 HRS) Nasopharyngeal Nasopharyngeal Swab     Status: Abnormal   Collection Time: 10/08/20  5:52 PM   Specimen: Nasopharyngeal Swab  Result Value Ref Range Status   SARS Coronavirus 2 POSITIVE (A) NEGATIVE Final    Comment: (NOTE) SARS-CoV-2 target nucleic acids are DETECTED.  The SARS-CoV-2 RNA is generally detectable in upper and lower respiratory specimens during the acute phase of infection. Positive results are indicative of the presence of SARS-CoV-2 RNA. Clinical correlation with patient history and other diagnostic information is  necessary to determine patient infection status. Positive results do not rule out bacterial infection or co-infection with other viruses.  The expected result is Negative.  Fact Sheet for Patients: SugarRoll.be  Fact Sheet for Healthcare Providers: https://www.woods-mathews.com/  This test is not yet approved or cleared by the [place] FDA and  has been authorized for detection and/or diagnosis of SARS-CoV-2 by FDA under an Emergency Use Authorization (EUA). This EUA will remain  in effect (meaning this test can be used) for the duration of  the COVID-19 declaration under Section 564(b)(1) of the Act, 21 U. S.C. section 360bbb-3(b)(1), unless the authorization is terminated or revoked sooner.   Performed at [facility], [place]  [address] [place], [place] [address]   Culture, fungus without smear     Status: None (Preliminary result)   Collection Time: 10/09/20  8:04 AM   Specimen: CSF; Cerebrospinal Fluid  Result Value Ref Range Status   Specimen Description   Final    CSF Performed at [facility], [address], [place], [place] [address]    Special Requests   Final    NONE Performed at [facility], [address], [place], [place] [address]    Culture   Final    NO FUNGUS ISOLATED AFTER 3 DAYS Performed at [facility], [place] [address], [place], [place] [address]    Report Status PENDING  Incomplete  CSF culture w Gram Stain     Status: None   Collection Time: 10/09/20  8:04 AM   Specimen: PATH Cytology CSF; Cerebrospinal Fluid  Result Value Ref Range Status   Specimen Description   Final    CSF Performed at [facility], [address], [place], [place] [address]    Special Requests   Final    NONE Performed at [facility], [address] [place], [place] [address]    Gram Stain   Final    CYTOSPIN SLIDE WBC PRESENT, PREDOMINANTLY MONONUCLEAR RBCS PRESENT NO ORGANISMS SEEN Performed at [facility], [address], [place], [place] [address]    Culture   Final    NO GROWTH 3 DAYS Performed at [facility], [place] [address], [place],  [address]    Report Status 10/12/2020 FINAL  Final      Scheduled Meds: . Chlorhexidine Gluconate Cloth  6 each Topical Daily  . enoxaparin (LOVENOX) injection  40 mg Subcutaneous Q24H  . insulin aspart  0-15 Units Subcutaneous TID WC  . insulin aspart  0-5 Units Subcutaneous QHS  . insulin glargine  8 Units Subcutaneous QHS  . levothyroxine  50 mcg Oral Q0600  . metoprolol succinate  50  mg Oral Daily  . pantoprazole  40 mg Oral Daily  . QUEtiapine  50 mg Oral QHS  . vitamin B-12  1,000 mcg Oral Daily  . ziprasidone  10 mg Intramuscular Once   Continuous Infusions: . sodium chloride 75 mL/hr at 10/13/20 0504    Assessment/Plan:  1. Acute delirium which is persistent.  Continue as needed Haldol and Seroquel.  Added IV Ativan today since family going to pursue hospice home.  Mental status has not improved since admission. 2. 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  JC virus in the CSF still pending.  JC virus positive in the serum is not diagnostic of PML but still waiting CSF results.  Family interested in pursuing hospice.  Awaiting hospice home bed availability. 3. 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  We will hold glargine insulin and just go with sliding scale since he did not eat breakfast today. 4. Hyperlipidemia unspecified.  Discontinue Crestor 5. Hypothyroidism unspecified.  Will discontinue levothyroxine at this time 6. Essential hypertension tachycardia on metoprolol 7. B12 deficiency we will get rid of B12 8. Blind in both eyes. 9. Urinary retentions.  Since patient will be going towards comfort care measures will place Foley catheter        Code Status:     Code Status Orders  (  From admission, onward)         Start     Ordered   10/08/20 1612  Do not attempt resuscitation (DNR)  Continuous       Question Answer Comment  In the event of cardiac or respiratory ARREST Do not call a "code blue"   In the event of cardiac or respiratory ARREST Do not perform Intubation, CPR, defibrillation or ACLS   In the event of cardiac or respiratory ARREST Use medication by any route, position, wound care, and other measures to relive pain and suffering. May use oxygen, suction and manual  treatment of airway obstruction as needed for comfort.      10/08/20 1612        Code Status History    Date Active Date Inactive Code Status Order ID Comments User Context   10/08/2020 1511 10/08/2020 1612 Full Code [id number]  Criss Alvine, DO Inpatient   09/13/2020 1851 09/15/2020 1745 Full Code [id number]  Rhetta Mura DO Inpatient   08/13/2020 1504 08/16/2020 1750 Full Code [id number]  CoxBriant Cedar, DO ED   Advance Care Planning Activity    Advance Directive Documentation   Flowsheet Row Most Recent Value  Type of Advance Directive Living will  Pre-existing out of facility DNR order (yellow form or pink MOST form) --  "MOST" Form in Place? --     Family Communication: Spoke with patient's wife on the phone Disposition Plan: Status is: Inpatient  Dispo: The patient is from: Home              Anticipated d/c is to: Hospice home              Patient currently ready to go to hospice home when bed available   Difficult to place patient.  No.  Time spent: 28 minutes  [place]

## 2020-10-13 NOTE — Progress Notes (Signed)
Brief Neurology Note  Patient has transitioned to comfort care. Neurology will not continue to actively follow, but please re-engage if we can be of further assistance.  Su Monks, MD [facility] [phone]  If 7pm- 7am, please page neurology on call as listed in [place].

## 2020-10-13 NOTE — Progress Notes (Signed)
Order Req for prayer. Met his wonderful granddaughter bedside. She was able to share her emotions and thoughts concerning her grandfather. I prayed for him, with her and assured her someone is always available if and when needed.

## 2020-10-13 NOTE — TOC Progression Note (Signed)
Transition of Care (TOC) - Progression Note    Patient Details  Name: Mitchell Chang. MRN: [id number] Date of Birth: 1951-02-17  Transition of Care [facility]) CM/SW Contact  Zigmund Daniel Dorian Pod, RN Phone Number:[phone] 10/13/2020, 1:05 PM  Clinical Narrative:    RN followed up with [place] spoke with [facility] concerning eligibility and availability for placement. Agency will follow up with [facility] RN on tomorrow concerning pt's disposition.   TOC team will continue to follow for discharge needs.   Expected Discharge Plan:  (Unsure of discharge diposition at this time) Barriers to Discharge: Continued Medical Work up  Expected Discharge Plan and Services Expected Discharge Plan:  (Unsure of discharge diposition at this time)   Discharge Planning Services: CM Consult   Living arrangements for the past 2 months: Single Family Home                 DME Arranged: N/A         HH Arranged: NA           Social Determinants of Health (SDOH) Interventions    Readmission Risk Interventions Readmission Risk Prevention Plan 10/11/2020  Transportation Screening Complete  PCP or Specialist Appt within 5-7 Days Not Complete  Not Complete comments unsure of prognosis or discharge disposition  Home Care Screening Complete  Medication Review (RN CM) Complete  Some recent data might be hidden

## 2020-10-14 MED ORDER — MORPHINE SULFATE (PF) 2 MG/ML IV SOLN
2.0000 mg | INTRAVENOUS | Status: DC | PRN
  Administered 2020-10-14 – 2020-10-15 (×2): 2 mg via INTRAVENOUS
  Filled 2020-10-14 (×3): qty 1

## 2020-10-14 MED ORDER — MORPHINE SULFATE (CONCENTRATE) 10 MG/0.5ML PO SOLN: 10.0000 mg | ORAL | Status: DC | PRN

## 2020-10-14 NOTE — TOC Progression Note (Signed)
Transition of Care (TOC) - Progression Note    Patient Details  Name: Mitchell Chang. MRN: [id number] Date of Birth: 1950/12/23  Transition of Care [facility]) CM/SW [place], LCSW Phone Number: 10/14/2020, 8:46 AM  Clinical Narrative:   Left VM for weekend hospice intake nurse requesting a return call.     Expected Discharge Plan:  (Unsure of discharge diposition at this time) Barriers to Discharge: Continued Medical Work up  Expected Discharge Plan and Services Expected Discharge Plan:  (Unsure of discharge diposition at this time)   Discharge Planning Services: CM Consult   Living arrangements for the past 2 months: Single Family Home                 DME Arranged: N/A         HH Arranged: NA           Social Determinants of Health (SDOH) Interventions    Readmission Risk Interventions Readmission Risk Prevention Plan 10/11/2020  Transportation Screening Complete  PCP or Specialist Appt within 5-7 Days Not Complete  Not Complete comments unsure of prognosis or discharge disposition  Home Care Screening Complete  Medication Review (RN CM) Complete  Some recent data might be hidden

## 2020-10-14 NOTE — Progress Notes (Signed)
Patient ID: Mitchell Chang., male   DOB: 1951/01/08, 70 y.o.   MRN: [id number] [facility] PROGRESS NOTE  Gwen Her [facility]. CBU:[id number] DOB: [birth year] DOA: 10/08/2020 PCP: Baxter Hire, MD  HPI/Subjective: Patient seen this morning and was less agitated than previous.  He awakened and was able to answer few questions.  He went back to sleep very quickly.  Objective: Vitals:   10/13/20 1134 10/14/20 0626  BP: (!) 156/63 (!) 180/94  Pulse: 72 (!) 105  Resp: 20 16  Temp: 98.3 F (36.8 C) 97.7 F (36.5 C)  SpO2: 99% 99%    Intake/Output Summary (Last 24 hours) at 10/14/2020 1256 Last data filed at 10/14/2020 0732 Gross per 24 hour  Intake 0 ml  Output 1300 ml  Net -1300 ml   Filed Weights   10/09/20 2016  Weight: 68.1 kg    ROS: Review of Systems  Unable to perform ROS: Acuity of condition  Cardiovascular: Negative for chest pain.  Gastrointestinal: Negative for abdominal pain.   Exam: Physical Exam HENT:     Head: Normocephalic.     Comments: Unable to look into mouth today. Eyes:     General: Lids are normal.  Cardiovascular:     Rate and Rhythm: Normal rate and regular rhythm.     Heart sounds: Normal heart sounds, S1 normal and S2 normal.  Pulmonary:     Breath sounds: Normal breath sounds. No decreased breath sounds, wheezing, rhonchi or rales.  Abdominal:     Palpations: Abdomen is soft.     Tenderness: There is no abdominal tenderness.  Musculoskeletal:     Right lower leg: No swelling.     Left lower leg: No swelling.  Skin:    General: Skin is warm.     Findings: No rash.  Neurological:     Mental Status: He is confused.       Data Reviewed: Basic Metabolic Panel: Recent Labs  Lab 10/08/20 1128 10/08/20 1545 10/09/20 0538 10/10/20 0600 10/12/20 0452  NA 139  --  138 137 141  K 4.0  --  4.0 3.8 3.7  CL 101  --  104 101 108  CO2 27  --  27 27 23   GLUCOSE 180*  --  163* 200* 113*  BUN 22  --  21 22 19   CREATININE 1.73*   --  1.54* 1.59* 1.69*  CALCIUM 9.0  --  9.0 9.2 8.9  MG  --  2.0  --   --   --   PHOS  --  3.5  --   --   --    Liver Function Tests: Recent Labs  Lab 10/08/20 1545 10/12/20 0452  AST 28 33  ALT 27 24  ALKPHOS 68 68  BILITOT 0.7 0.9  PROT 6.8 6.1*  ALBUMIN 3.8 3.6   Recent Labs  Lab 10/08/20 1545  AMMONIA 10   CBC: Recent Labs  Lab 10/08/20 1128 10/09/20 0538 10/10/20 0600 10/12/20 0452  WBC 7.0 6.1 5.8 7.4  NEUTROABS 5.3  --   --  5.5  HGB 11.0* 10.7* 11.2* 10.3*  HCT 33.6* 31.4* 32.4* 29.9*  MCV 104.3* 102.3* 100.9* 101.4*  PLT 212 203 212 199   BNP (last 3 results) Recent Labs    09/13/20 1056  BNP 84.3    CBG: Recent Labs  Lab 10/12/20 1149 10/12/20 1559 10/12/20 2111 10/13/20 0713 10/13/20 1136  GLUCAP 154* 162* 112* 137* 144*    Recent Results (from  the past 240 hour(s))  CULTURE, BLOOD (ROUTINE X 2) w Reflex to ID Panel     Status: None   Collection Time: 10/08/20  3:45 PM   Specimen: BLOOD  Result Value Ref Range Status   Specimen Description BLOOD RIGHT ANTECUBITAL  Final   Special Requests   Final    BOTTLES DRAWN AEROBIC AND ANAEROBIC Blood Culture adequate volume   Culture   Final    NO GROWTH 5 DAYS Performed at [facility], [address] [place], [place] [address]    Report Status 10/13/2020 FINAL  Final  CULTURE, BLOOD (ROUTINE X 2) w Reflex to ID Panel     Status: None   Collection Time: 10/08/20  3:45 PM   Specimen: BLOOD  Result Value Ref Range Status   Specimen Description BLOOD LEFT ANTECUBITAL  Final   Special Requests   Final    BOTTLES DRAWN AEROBIC AND ANAEROBIC Blood Culture adequate volume   Culture   Final    NO GROWTH 5 DAYS Performed at [facility], [address], [place], [place] [address]    Report Status 10/13/2020 FINAL  Final  SARS CORONAVIRUS 2 (TAT 6-24 HRS) Nasopharyngeal Nasopharyngeal Swab     Status: Abnormal   Collection Time: 10/08/20  5:52 PM   Specimen: Nasopharyngeal  Swab  Result Value Ref Range Status   SARS Coronavirus 2 POSITIVE (A) NEGATIVE Final    Comment: (NOTE) SARS-CoV-2 target nucleic acids are DETECTED.  The SARS-CoV-2 RNA is generally detectable in upper and lower respiratory specimens during the acute phase of infection. Positive results are indicative of the presence of SARS-CoV-2 RNA. Clinical correlation with patient history and other diagnostic information is  necessary to determine patient infection status. Positive results do not rule out bacterial infection or co-infection with other viruses.  The expected result is Negative.  Fact Sheet for Patients: SugarRoll.be  Fact Sheet for Healthcare Providers: https://www.woods-mathews.com/  This test is not yet approved or cleared by the [place] FDA and  has been authorized for detection and/or diagnosis of SARS-CoV-2 by FDA under an Emergency Use Authorization (EUA). This EUA will remain  in effect (meaning this test can be used) for the duration of the COVID-19 declaration under Section 564(b)(1) of the Act, 21 U. S.C. section 360bbb-3(b)(1), unless the authorization is terminated or revoked sooner.   Performed at [facility], [place] [address] [place], [place] [address]   Culture, fungus without smear     Status: None (Preliminary result)   Collection Time: 10/09/20  8:04 AM   Specimen: CSF; Cerebrospinal Fluid  Result Value Ref Range Status   Specimen Description   Final    CSF Performed at [facility], [address], [place], [place] [address]    Special Requests   Final    NONE Performed at [facility], [address] [place], [place] [address]    Culture   Final    NO FUNGUS ISOLATED AFTER 5 DAYS Performed at [facility], [place] [address], [place], [place] [address]    Report Status PENDING  Incomplete  CSF culture w Gram Stain     Status: None   Collection Time: 10/09/20  8:04 AM    Specimen: PATH Cytology CSF; Cerebrospinal Fluid  Result Value Ref Range Status   Specimen Description   Final    CSF Performed at [facility], [address] [place], [place] [address]    Special Requests  Final    NONE Performed at [facility], [place] [place], [address] [address]    Gram Stain   Final    CYTOSPIN SLIDE WBC PRESENT, PREDOMINANTLY MONONUCLEAR RBCS PRESENT NO ORGANISMS SEEN Performed at [facility], [address], [place], [place] [address]    Culture   Final    NO GROWTH 3 DAYS Performed at [facility], [place] [address] [place],  [address]    Report Status 10/12/2020 FINAL  Final     Assessment/Plan:  1. End-of-life care, comfort care measures.  Awaiting hospice home bed availability.  As needed Haldol, Ativan, morphine. 2. Acute delirium.  Continue as needed medications 3. 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  Cytology negative in the cerebrospinal fluid.  JC virus in the CSF still pending.  Family converted to comfort care measures and waiting hospice home bed availability. 4. Type 2 diabetes mellitus with chronic kidney disease stage III N/A 5. Hyperlipidemia unspecified 6. Hypothyroidism unspecified 7. Essential hypertension and tachycardia 8. B12 deficiency 9. Blind in both eyes 10. Urinary retention.  Patient has Foley catheter.     Code Status:     Code Status Orders  (From admission, onward)         Start     Ordered   10/13/20 1233  Do not attempt resuscitation (DNR)  Continuous       Question Answer Comment  In the event of cardiac or respiratory ARREST Do not call a "code blue"   In the event of cardiac or respiratory ARREST Do not perform Intubation, CPR, defibrillation or ACLS   In the event of cardiac or respiratory ARREST Use medication by any route, position, wound care, and other measures to  relive pain and suffering. May use oxygen, suction and manual treatment of airway obstruction as needed for comfort.   Comments nurse may pronounce      10/13/20 1232        Code Status History    Date Active Date Inactive Code Status Order ID Comments User Context   10/08/2020 1612 10/13/2020 1232 DNR [id number]  Criss Alvine, DO Inpatient   10/08/2020 1511 10/08/2020 1612 Full Code [id number]  Criss Alvine, DO Inpatient   09/13/2020 1851 09/15/2020 1745 Full Code [id number]  Rhetta Mura DO Inpatient   08/13/2020 1504 08/16/2020 1750 Full Code [id number]  CoxBriant Cedar, DO ED   Advance Care Planning Activity    Advance Directive Documentation   Flowsheet Row Most Recent Value  Type of Advance Directive Living will  Pre-existing out of facility DNR order (yellow form or pink MOST form) --  "MOST" Form in Place? --     Family Communication: Spoke with the patient's wife on the phone Disposition Plan: Status is: Inpatient  Dispo: The patient is from: Home              Anticipated d/c is to: Hospice home once bed available              Patient currently undergoing comfort care measures for end-of-life care   Difficult to place patient.  Awaiting hospice home bed availability  Time spent: 25 minutes  [place]

## 2020-10-14 NOTE — TOC Progression Note (Signed)
Transition of Care (TOC) - Progression Note    Patient Details  Name: Mitchell Chang. MRN: [id number] Date of Birth: Feb 08, 1951  Transition of Care [facility]) CM/SW Contact  Zigmund Daniel Dorian Pod, RN Phone Number:[phone] 10/14/2020, 2:04 PM  Clinical Narrative:    Follow up with [facility] on eligibility and beds that may be available. Spoke with hospice who has approved pt for services via Dr. Lyman Speller however not able to offer a bed today. Indicated bed maybe available Monday or Tuesday. Request follow-up with the hospital liaison Santiago Glad).  TOC team will continue to follow.   Expected Discharge Plan:  (Unsure of discharge diposition at this time) Barriers to Discharge: Continued Medical Work up  Expected Discharge Plan and Services Expected Discharge Plan:  (Unsure of discharge diposition at this time)   Discharge Planning Services: CM Consult   Living arrangements for the past 2 months: Single Family Home                 DME Arranged: N/A         HH Arranged: NA           Social Determinants of Health (SDOH) Interventions    Readmission Risk Interventions Readmission Risk Prevention Plan 10/11/2020  Transportation Screening Complete  PCP or Specialist Appt within 5-7 Days Not Complete  Not Complete comments unsure of prognosis or discharge disposition  Home Care Screening Complete  Medication Review (RN CM) Complete  Some recent data might be hidden

## 2020-10-15 MED ORDER — HALOPERIDOL LACTATE 5 MG/ML IJ SOLN: 1.0000 mg | Freq: Four times a day (QID) | INTRAMUSCULAR | Status: AC | PRN

## 2020-10-15 MED ORDER — GLYCOPYRROLATE 0.2 MG/ML IJ SOLN: 0.2000 mg | INTRAMUSCULAR | Status: AC | PRN

## 2020-10-15 MED ORDER — POLYVINYL ALCOHOL 1.4 % OP SOLN: 1.0000 [drp] | Freq: Four times a day (QID) | OPHTHALMIC | 0 refills | Status: AC | PRN

## 2020-10-15 MED ORDER — MORPHINE SULFATE (CONCENTRATE) 10 MG/0.5ML PO SOLN: 10.0000 mg | ORAL | 0 refills | Status: AC | PRN

## 2020-10-15 MED ORDER — ONDANSETRON HCL 4 MG/2ML IJ SOLN: 4.0000 mg | Freq: Four times a day (QID) | INTRAMUSCULAR | 0 refills | Status: AC | PRN

## 2020-10-15 MED ORDER — LORAZEPAM 2 MG/ML IJ SOLN: 1.0000 mg | INTRAMUSCULAR | 0 refills | Status: AC | PRN

## 2020-10-15 MED ORDER — MORPHINE SULFATE (PF) 2 MG/ML IV SOLN: 2.0000 mg | INTRAVENOUS | 0 refills | Status: AC | PRN

## 2020-10-15 NOTE — Progress Notes (Signed)
[place] Room [facility]) Hospital Liaison RN Note:  Hospice Home does have a room to offer to patient today. Chart reviewed and eligibility has been approved. Spoke with spouse, Vaughan Basta via the phone to confirm interests and explain services. She verbalized understanding. Vaughan Basta will sign registration paperwork at the [facility] at 4:30pm today and transport can be arranged for 5:30pm. I will fax the discharge summary to the Hospice Home. Hospital care team is aware.  Please call with any hospice related questions or concerns.  Thank you for the opportunity to participate in this patient's care.  Zandra Abts, RN [facility] Care Liaison [phone]

## 2020-10-15 NOTE — TOC Transition Note (Signed)
Transition of Care [facility]) - CM/SW Discharge Note   Patient Details  Name: Mitchell Chang. MRN: [id number] Date of Birth: 11-15-1950  Transition of Care [facility]) CM/SW Contact:  Shelbie Hutching, RN Phone Number: 10/15/2020, 11:03 AM   Clinical Narrative:    Patient has a bed at [facility] in [place].  Transport has been arranged at 5:30 pm as requested by Hospice facility with [facility].     Final next level of care: [place] Barriers to Discharge: Barriers Resolved   Patient Goals and CMS Choice Patient states their goals for this hospitalization and ongoing recovery are:: chooses hospice care at hospice facility CMS Medicare.gov Compare Post Acute Care list provided to:: Patient Represenative (must comment) Choice offered to / list presented to : [place]  Discharge Placement              Patient chooses bed at:  [facility] facility) Patient to be transferred to facility by: [facility] Name of family member notified: wife and children Patient and family notified of of transfer: 10/15/20  Discharge Plan and Services   Discharge Planning Services: CM Consult            DME Arranged: N/A         HH Arranged: NA          Social Determinants of Health ([place]) Interventions     Readmission Risk Interventions Readmission Risk Prevention Plan 10/11/2020  Transportation Screening Complete  PCP or Specialist Appt within 5-7 Days Not Complete  Not Complete comments unsure of prognosis or discharge disposition  Home Care Screening Complete  Medication Review (RN CM) Complete  Some recent data might be hidden

## 2020-10-15 NOTE — Care Management Important Message (Signed)
Important Message  Patient Details  Name: Mitchell Chang. MRN: [id number] Date of Birth: 1951/06/07   Medicare Important Message Given:  Other (see comment)  Patient is on comfort care and awaiting a bed at the [facility].  Out of respect for the patient and family no Important Message from [facility] given.   Juliann Pulse A Frankie Scipio 10/15/2020, 7:47 AM

## 2020-10-15 NOTE — Discharge Summary (Signed)
[place] [facility] [place] NAME: Mitchell Chang    MR#:  [id number]  [place] OF BIRTH:  [birth year]  DATE OF ADMISSION:  10/08/2020 ADMITTING PHYSICIAN: Wyvonnia Dusky, MD  DATE OF DISCHARGE: 10/15/2020  PRIMARY CARE PHYSICIAN: Baxter Hire, MD    ADMISSION DIAGNOSIS:  Altered mental status [R41.82] AMS (altered mental status) [R41.82]  DISCHARGE DIAGNOSIS:  Active Problems:   Blindness of both eyes   CKD stage 3 due to type 2 diabetes mellitus ([place])   Essential hypertension   Lymphoma, non-Hodgkin's (HCC)   Follicular lymphoma grade III, unspecified, intra-abdominal lymph nodes (HCC)   Clinical depression   Hypothyroidism   Altered mental status   Type 2 diabetes mellitus without complication, with long-term current use of insulin (HCC)   AMS (altered mental status)   Acute delirium   Brain mass   Acute urinary retention   End of life care   Hyperlipidemia   SECONDARY DIAGNOSIS:   Past Medical History:  Diagnosis Date  . Blind in both eyes   . Cancer ([place])    non-hodgkins lymphoma  . Chronic kidney disease   . Diabetes mellitus without complication ([place])   . Hypertension     HOSPITAL COURSE:   1.  End-of-life care, comfort care measures.  Patient will be transferred to the hospice home later today.  Continue comfort care measures with as needed IV Haldol, IV Ativan, IV morphine.  If patient loses IV access can convert things over to oral.  All nonessential medications were discontinued. 2.  Acute delirium.  Continue as needed medications Haldol and Ativan.  Mental status has not improved during the hospital course.  Patient was agitated for a lot of the hospital course but better with as needed Haldol and Ativan.  Patient able to answer a few questions but has not eaten anything in the last 2 days. 3.  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  Cytology  negative in the cerebral spinal fluid.  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  Cultures negative out of cerebrospinal fluid.  Family interested in comfort care measures.  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  Seen by oncology, neurology, infectious disease and palliative care. 4.  Type 2 diabetes mellitus with chronic kidney disease stage IIIa 5.  Hyperlipidemia unspecified 6.  Hypothyroidism unspecified 7.  Essential hypertension tachycardia 8.  Blind in both eyes 9.  Urinary retention.  Foley catheter placed during the hospital course and will even with comfort care measures. 10.  Patient is a DO NOT RESUSCITATE   DISCHARGE CONDITIONS:   Guarded  CONSULTS OBTAINED:  Treatment Team:  Tsosie Billing, MD  Neurology Oncology  DRUG ALLERGIES:   Allergies  Allergen Reactions  . Sulfa Antibiotics Other (See Comments)    Patient states frequent and persistent urination.    DISCHARGE MEDICATIONS:   Allergies as of 10/15/2020      Reactions   Sulfa Antibiotics Other (See Comments)   Patient states frequent and persistent urination.      Medication List    STOP taking these medications   Accu-Chek Guide test strip Generic drug: glucose blood   albuterol 108 (90 Base) MCG/ACT inhaler Commonly known as: VENTOLIN HFA   aspirin EC 81 MG tablet   chlorpheniramine-HYDROcodone 10-8 MG/5ML Suer Commonly known as: TUSSIONEX   clobetasol cream 0.05 %  Commonly known as: TEMOVATE   doxepin 50 MG capsule Commonly known as: SINEQUAN   insulin aspart 100 UNIT/ML FlexPen Commonly known as: NOVOLOG   ketoconazole 2 % shampoo Commonly known as: NIZORAL   levothyroxine 112 MCG tablet Commonly known as: SYNTHROID   metoprolol succinate 50 MG 24 hr tablet Commonly known as: TOPROL-XL   multivitamin with minerals Tabs tablet   NIFEdipine 60 MG 24 hr tablet Commonly known as: PROCARDIA XL/NIFEDICAL XL   omeprazole 20 MG  capsule Commonly known as: PRILOSEC   prednisoLONE sodium phosphate 1 % ophthalmic solution Commonly known as: INFLAMASE FORTE   rosuvastatin 20 MG tablet Commonly known as: CRESTOR   sitaGLIPtin 50 MG tablet Commonly known as: JANUVIA   Tresiba FlexTouch 200 UNIT/ML FlexTouch Pen Generic drug: insulin degludec   Vitamin D2 50 MCG (2000 UT) Tabs     TAKE these medications   glycopyrrolate 0.2 MG/ML injection Commonly known as: ROBINUL Inject 1 mL (0.2 mg total) into the vein every 4 (four) hours as needed (excessive secretions).   haloperidol lactate 5 MG/ML injection Commonly known as: HALDOL Inject 0.2 mLs (1 mg total) into the vein every 6 (six) hours as needed.   LORazepam 2 MG/ML injection Commonly known as: ATIVAN Inject 0.5 mLs (1 mg total) into the vein every 4 (four) hours as needed for anxiety (agitation).   morphine 2 MG/ML injection Inject 1 mL (2 mg total) into the vein every 2 (two) hours as needed.   morphine CONCENTRATE 10 MG/0.5ML Soln concentrated solution Take 0.5 mLs (10 mg total) by mouth every 2 (two) hours as needed for moderate pain or shortness of breath.   ondansetron 4 MG/2ML Soln injection Commonly known as: ZOFRAN Inject 2 mLs (4 mg total) into the vein every 6 (six) hours as needed for nausea or vomiting.   polyvinyl alcohol 1.4 % ophthalmic solution Commonly known as: LIQUIFILM TEARS Place 1 drop into both eyes 4 (four) times daily as needed for dry eyes.        DISCHARGE INSTRUCTIONS:   Follow-up with team at hospice home 1 day  If you experience worsening of your admission symptoms, develop shortness of breath, life threatening emergency, suicidal or homicidal thoughts you must seek medical attention immediately by calling 911 or calling your MD immediately  if symptoms less severe.  You Must read complete instructions/literature along with all the possible adverse reactions/side effects for all the Medicines you take and that  have been prescribed to you. Take any new Medicines after you have completely understood and accept all the possible adverse reactions/side effects.   Please note  You were cared for by a hospitalist during your hospital stay. If you have any questions about your discharge medications or the care you received while you were in the hospital after you are discharged, you can call the unit and asked to speak with the hospitalist on call if the hospitalist that took care of you is not available. Once you are discharged, your primary care physician will handle any further medical issues. Please note that NO REFILLS for any discharge medications will be authorized once you are discharged, as it is imperative that you return to your primary care physician (or establish a relationship with a primary care physician if you do not have one) for your aftercare needs so that they can reassess your need for medications and monitor your lab values.    Today   CHIEF COMPLAINT:  Brought in with altered mental status  HISTORY OF PRESENT ILLNESS:  Mitchell Chang  is a 70 y.o. male with a known history of follicular lymphoma was brought in with altered mental status   VITAL SIGNS:  Blood pressure (!) 174/94, pulse (!) 119, temperature 97.7 F (36.5 C), temperature source Oral, resp. rate 20, height 5\' 7"  (1.702 m), weight 68.1 kg, SpO2 96 %.  I/O:    Intake/Output Summary (Last 24 hours) at 10/15/2020 1123 Last data filed at 10/15/2020 0845 Gross per 24 hour  Intake --  Output 600 ml  Net -600 ml    PHYSICAL EXAMINATION:  GENERAL:  70 y.o.-year-old patient lying in the bed with no acute distress.  EYES: Pupils clouded over HEENT: Head atraumatic, normocephalic. Oropharynx and nasopharynx clear.   LUNGS: Normal breath sounds bilaterally, no wheezing, rales,rhonchi or crepitation. No use of accessory muscles of respiration.  CARDIOVASCULAR: S1, S2 tachycardic. No murmurs, rubs, or gallops.  ABDOMEN: Soft,  non-tender, non-distended.  EXTREMITIES: No pedal edema.  NEUROLOGIC: Patient able to move his arms on his own today PSYCHIATRIC: The patient is lethargic but does answer a few questions.  SKIN: No obvious rash, lesion, or ulcer.   DATA REVIEW:   CBC Recent Labs  Lab 10/12/20 0452  WBC 7.4  HGB 10.3*  HCT 29.9*  PLT 199    Chemistries  Recent Labs  Lab 10/08/20 1545 10/09/20 0538 10/12/20 0452  NA  --    < > 141  K  --    < > 3.7  CL  --    < > 108  CO2  --    < > 23  GLUCOSE  --    < > 113*  BUN  --    < > 19  CREATININE  --    < > 1.69*  CALCIUM  --    < > 8.9  MG 2.0  --   --   AST 28  --  33  ALT 27  --  24  ALKPHOS 68  --  68  BILITOT 0.7  --  0.9   < > = values in this interval not displayed.    Microbiology Results  Results for orders placed or performed during the hospital encounter of 10/08/20  CULTURE, BLOOD (ROUTINE X 2) w Reflex to ID Panel     Status: None   Collection Time: 10/08/20  3:45 PM   Specimen: BLOOD  Result Value Ref Range Status   Specimen Description BLOOD RIGHT ANTECUBITAL  Final   Special Requests   Final    BOTTLES DRAWN AEROBIC AND ANAEROBIC Blood Culture adequate volume   Culture   Final    NO GROWTH 5 DAYS Performed at [facility], [address] [place], [place] [address]    Report Status 10/13/2020 FINAL  Final  CULTURE, BLOOD (ROUTINE X 2) w Reflex to ID Panel     Status: None   Collection Time: 10/08/20  3:45 PM   Specimen: BLOOD  Result Value Ref Range Status   Specimen Description BLOOD LEFT ANTECUBITAL  Final   Special Requests   Final    BOTTLES DRAWN AEROBIC AND ANAEROBIC Blood Culture adequate volume   Culture   Final    NO GROWTH 5 DAYS Performed at [facility], [address], [place], [place] [address]    Report Status 10/13/2020 FINAL  Final  SARS CORONAVIRUS 2 (TAT 6-24 HRS) Nasopharyngeal Nasopharyngeal Swab     Status: Abnormal   Collection Time: 10/08/20  5:52 PM   Specimen:  Nasopharyngeal Swab  Result Value Ref Range Status   SARS Coronavirus 2 POSITIVE (A) NEGATIVE Final    Comment: (NOTE) SARS-CoV-2 target nucleic acids are DETECTED.  The SARS-CoV-2 RNA is generally detectable in upper and lower respiratory specimens during the acute phase of infection. Positive results are indicative of the presence of SARS-CoV-2 RNA. Clinical correlation with patient history and other diagnostic information is  necessary to determine patient infection status. Positive results do not rule out bacterial infection or co-infection with other viruses.  The expected result is Negative.  Fact Sheet for Patients: SugarRoll.be  Fact Sheet for Healthcare Providers: https://www.woods-mathews.com/  This test is not yet approved or cleared by the [place] FDA and  has been authorized for detection and/or diagnosis of SARS-CoV-2 by FDA under an Emergency Use Authorization (EUA). This EUA will remain  in effect (meaning this test can be used) for the duration of the COVID-19 declaration under Section 564(b)(1) of the Act, 21 U. S.C. section 360bbb-3(b)(1), unless the authorization is terminated or revoked sooner.   Performed at [facility], [place] [address] [place], [place] [address]   Culture, fungus without smear     Status: None (Preliminary result)   Collection Time: 10/09/20  8:04 AM   Specimen: CSF; Cerebrospinal Fluid  Result Value Ref Range Status   Specimen Description   Final    CSF Performed at [facility], [address], [place], [place] [address]    Special Requests   Final    NONE Performed at [facility], [address], [place], [place] [address]    Culture   Final    NO FUNGUS ISOLATED AFTER 6 DAYS Performed at [facility], [place] [address] [place], [place] [address]    Report Status PENDING  Incomplete  CSF culture w Gram Stain     Status: None   Collection Time:  10/09/20  8:04 AM   Specimen: PATH Cytology CSF; Cerebrospinal Fluid  Result Value Ref Range Status   Specimen Description   Final    CSF Performed at [facility], [address], [place], [place] [address]    Special Requests   Final    NONE Performed at [facility], [address] [place], [place] [address]    Gram Stain   Final    CYTOSPIN SLIDE WBC PRESENT, PREDOMINANTLY MONONUCLEAR RBCS PRESENT NO ORGANISMS SEEN Performed at [facility], [address], [place], [place] [address]    Culture   Final    NO GROWTH 3 DAYS Performed at [facility], [place] [address], [place],  [address]    Report Status 10/12/2020 FINAL  Final    Management plans discussed with the patient, family at bedside and they are in agreement.  CODE STATUS:     Code Status Orders  (From admission, onward)         Start     Ordered   10/13/20 1233  Do not attempt resuscitation (DNR)  Continuous       Question Answer Comment  In the event of cardiac or respiratory ARREST Do not call a "code blue"   In the event of cardiac or respiratory ARREST Do not perform Intubation, CPR, defibrillation or ACLS   In the event of cardiac or respiratory ARREST Use medication by any route, position, wound care, and other measures to relive pain and suffering. May use oxygen, suction and manual  treatment of airway obstruction as needed for comfort.   Comments nurse may pronounce      10/13/20 1232        Code Status History    Date Active Date Inactive Code Status Order ID Comments User Context   10/08/2020 1612 10/13/2020 1232 DNR [id number]  Criss Alvine, DO Inpatient   10/08/2020 1511 10/08/2020 1612 Full Code [id number]  Criss Alvine, DO Inpatient   09/13/2020 1851 09/15/2020 1745 Full Code [id number]  Rhetta Mura DO Inpatient   08/13/2020 1504 08/16/2020 1750 Full Code [id number]  CoxBriant Cedar, DO ED   Advance Care Planning Activity    Advance Directive Documentation    Flowsheet Row Most Recent Value  Type of Advance Directive Living will  Pre-existing out of facility DNR order (yellow form or pink MOST form) --  "MOST" Form in Place? --      TOTAL TIME TAKING CARE OF THIS PATIENT: 31 minutes.    Loletha Grayer M.D on 10/15/2020 at 11:23 AM  Between 7am to 6pm - Pager - [phone]  After 6pm go to [email or web address] - password [email or web address]  [facility]  CC: Primary care physician; Baxter Hire, MD

## 2020-10-15 NOTE — Progress Notes (Signed)
Report given to [facility].

## 2020-10-16 LAB — MISC LABCORP TEST (SEND OUT): Labcorp test code: 2013305

## 2020-10-17 ENCOUNTER — Telehealth: Payer: Self-pay | Admitting: Internal Medicine

## 2020-10-17 ENCOUNTER — Inpatient Hospital Stay: Payer: Medicare PPO

## 2020-10-17 ENCOUNTER — Inpatient Hospital Stay: Payer: Medicare PPO | Admitting: Internal Medicine

## 2020-10-19 LAB — MISC LABCORP TEST (SEND OUT): Labcorp test code: 139340

## 2020-10-22 ENCOUNTER — Ambulatory Visit: Payer: Medicare PPO | Admitting: Internal Medicine

## 2020-10-22 ENCOUNTER — Other Ambulatory Visit: Payer: Medicare PPO

## 2020-10-24 NOTE — Progress Notes (Signed)
Patient ID: Mitchell Chang., male   DOB: 01-05-1951, 70 y.o.   MRN: [id number]  10/24/2020  I saw the patient's wife in the hallway today at the hospital.  She stated that her husband passed away at the hospice home.  She mentioned that he passed away peacefully.  She thanked me for my care.  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  Dr Loletha Grayer

## 2020-10-30 LAB — CULTURE, FUNGUS WITHOUT SMEAR

## 2020-11-11 NOTE — Telephone Encounter (Signed)
On 04/06- I called patient's wife and offered my condolences.  Family very thankful for the call. Appreciate the cancer center staff for the care and support provided to the patient.  GB

## 2020-11-11 DEATH — deceased

## 2021-01-31 LAB — MISC LABCORP TEST (SEND OUT): Labcorp test code: 55591

## 2022-03-13 IMAGING — CT CT HEAD WO/W CM
4 of 8 series · 13 of 47 positions shown, 14 images · IV contrast (omnipaque)
Comparison: Head CT 09/13/2020. Head CT 02/12/2006. PET-CT
06/12/2020.

CLINICAL DATA: Disorientation. Follicular lymphoma grade 3,
unspecified, intra-abdominal lymph nodes. Mental status change,
persistent or worsening; history of follicular lymphoma with
progressive confusion and disorientation.

EXAM:
CT HEAD WITHOUT AND WITH CONTRAST
TECHNIQUE: Contiguous axial images were obtained from the base of the skull
through the vertex without and with intravenous contrast
CONTRAST:  75mL OMNIPAQUE IOHEXOL 300 MG/ML  SOLN

[Series 6: axial with brain 5.00 ax · axial · 0.34mm/px · z∈[-539,-449]mm · 4 of 31 slices shown, 5 images]
[im 7/31  brain]
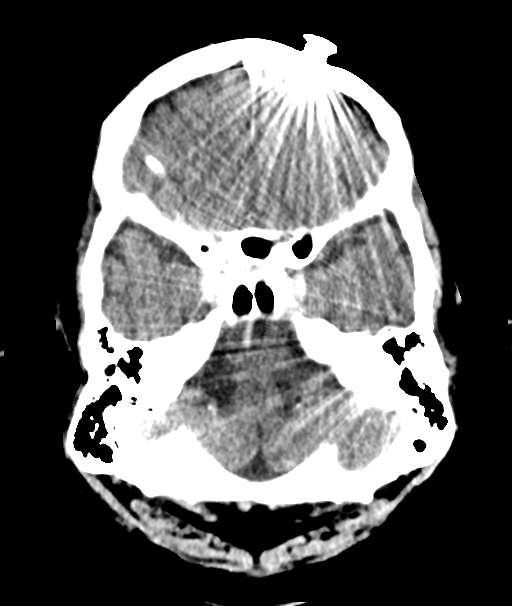
[im 7/31  bone]
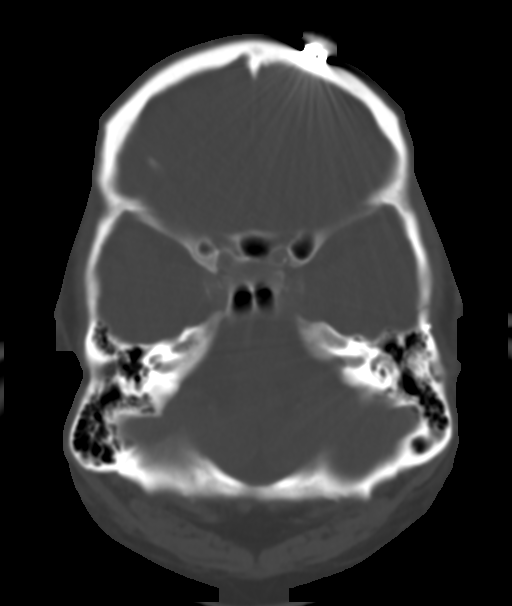
[im 13/31  brain]
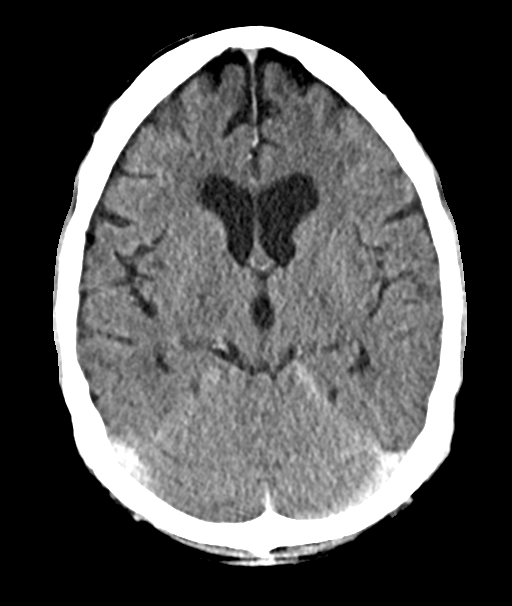
[im 19/31  brain]
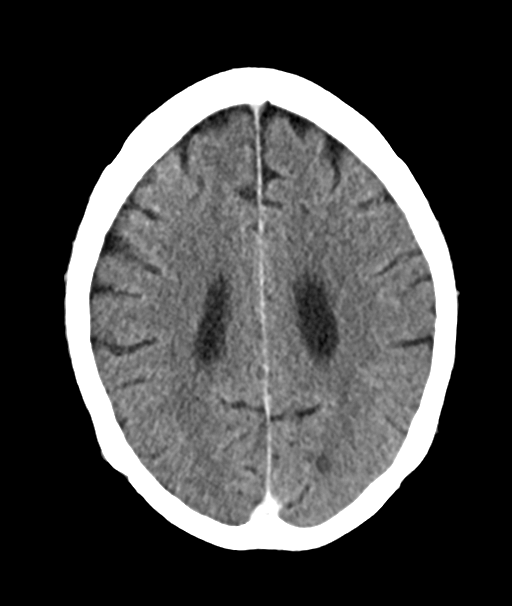
[im 25/31  brain]
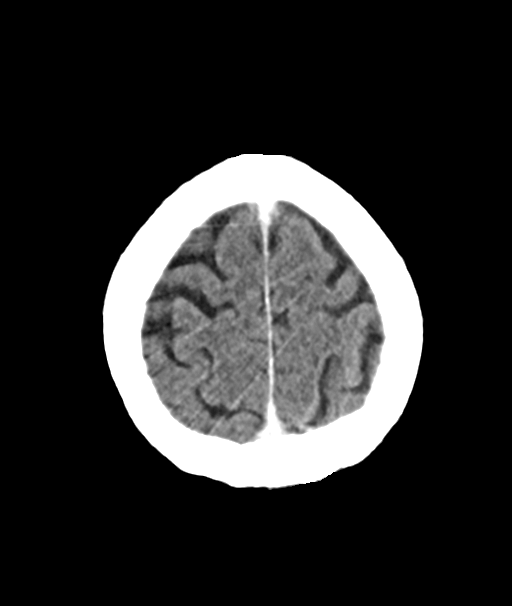

[Series 8: coronals brain 2.00 cor · coronal · 0.31mm/px · 3 of 102 slices shown]
[im 26/102  brain]
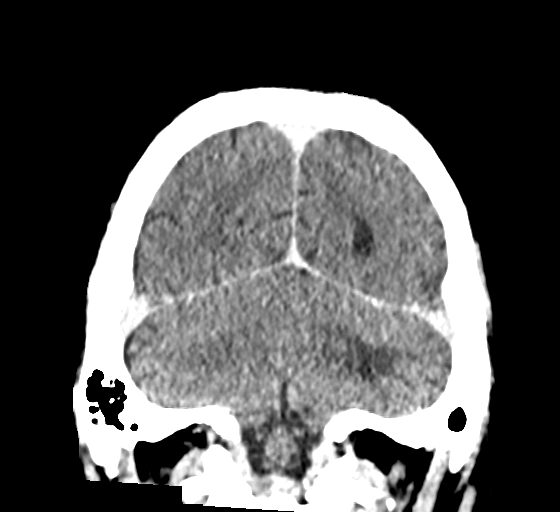
[im 51/102  brain]
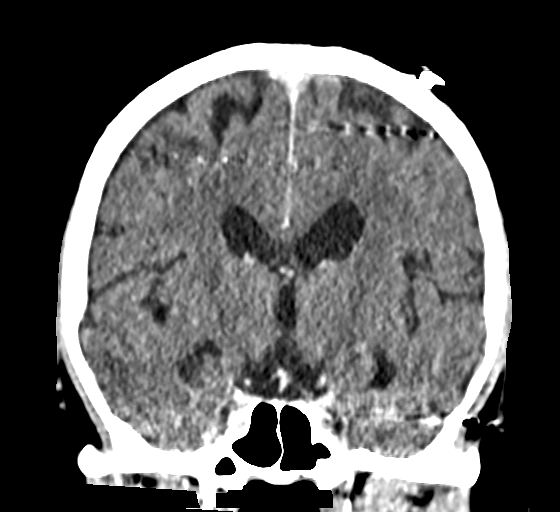
[im 76/102  brain]
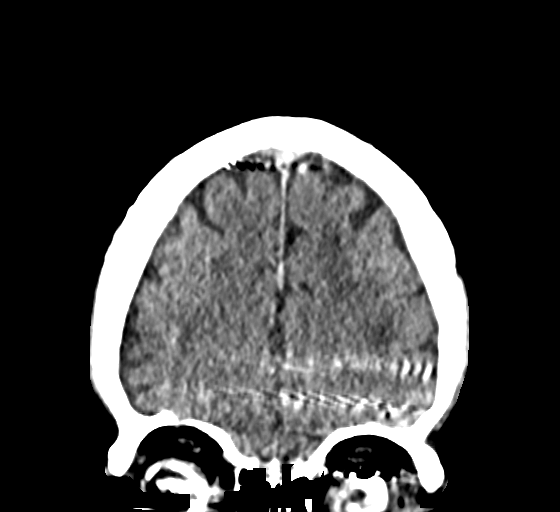

[Series 14: sagittals brain (person_name) 2.00 sag · sagittal · 0.31mm/px · 2 of 86 slices shown]
[im 29/86  brain]
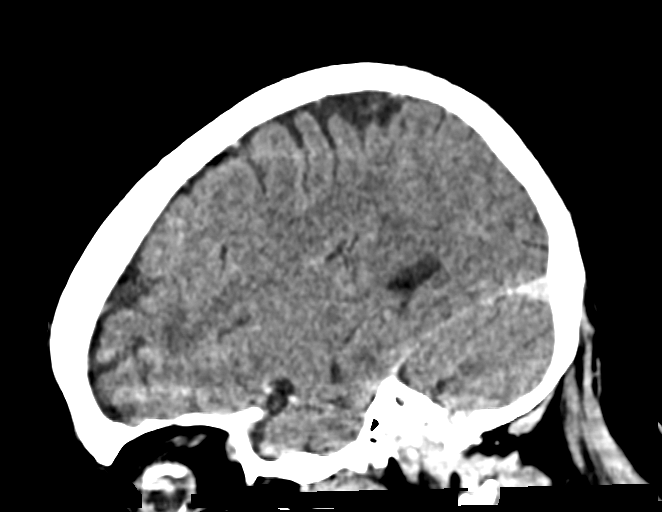
[im 57/86  brain]
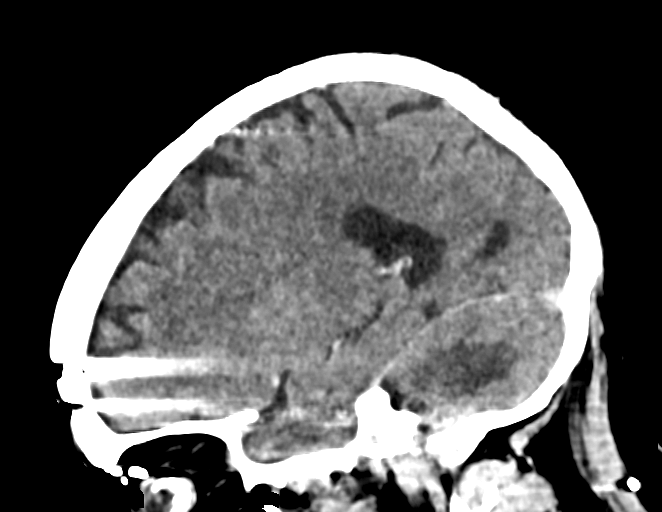

[Series 16: axial with brain (person_name) 5.00 ax · axial · 0.34mm/px · z∈[-539,-449]mm · 4 of 31 slices shown]
[im 7/31  brain]
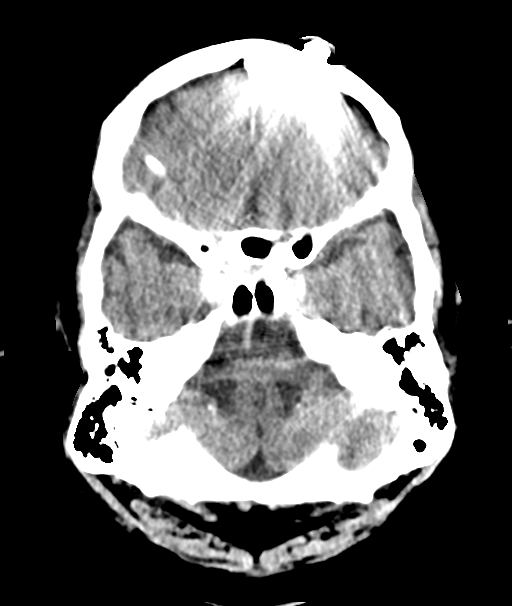
[im 13/31  brain]
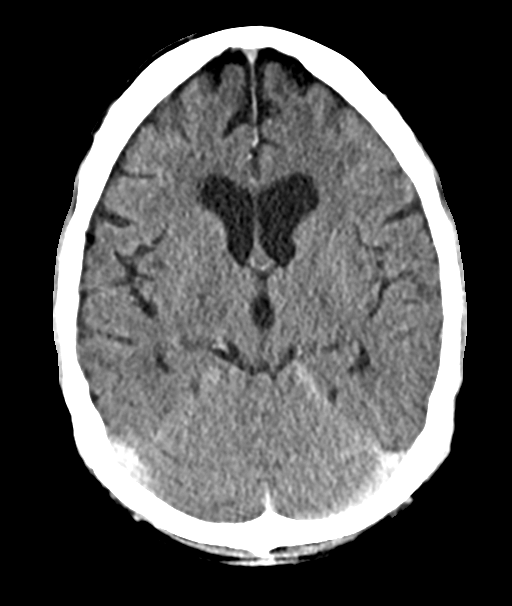
[im 19/31  brain]
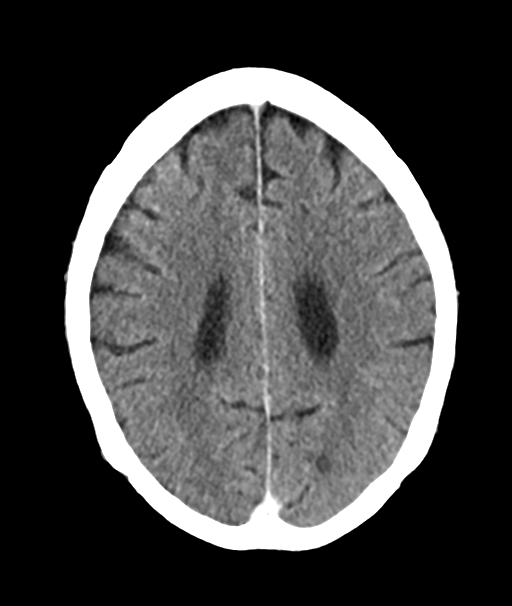
[im 25/31  brain]
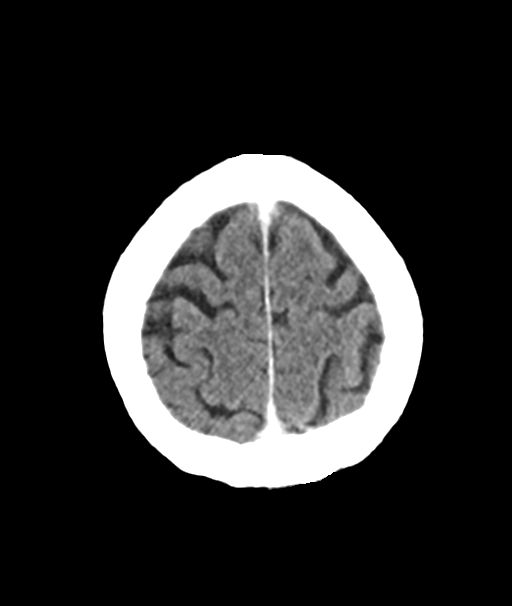

[13 of 47 positions shown; findings below may reference images not displayed]

FINDINGS: Brain:

Streak and beam hardening artifact arising from retained metallic
foreign bodies within the scalp/forehead soft tissues partially
obscures the intracranial contents.

Mild cerebral atrophy.

There is an abnormal focus of hypodensity within the left cerebellar
white matter measuring 3.0 x 2.6 x 1.7 cm. In retrospect, this was
present on the prior examination of 09/13/2020, measuring 2.4 x
cm in transaxial dimensions at that time. There is no definite
corresponding enhancement. No definite mass effect at this time.

Redemonstrated small foci of nonenhancing hypodensity within the
left greater than right anterior frontal lobe subcortical white
matter as well as left frontoparietal white matter more posteriorly
(for instance as seen on series 2, images 14, 11, 10, 21). There
appears to be some involvement of the subcortical U-fibers.

There is no acute intracranial hemorrhage.

No demarcated cortical infarct.

No extra-axial fluid collection.

No midline shift.

Vascular: No hyperdense vessel.  Atherosclerotic calcifications.

Skull: Normal. Negative for fracture or focal lesion.

Sinuses/Orbits: Visualized orbits show no acute finding.
Redemonstrated retained metallic foreign bodies within the bilateral
orbits. Bilateral phthisis bulbi. Mild bilateral ethmoid sinus
mucosal thickening.

Other: Redemonstrated retained metallic foreign bodies within the
bilateral scalp and forehead soft tissues.

These results were called by telephone at the time of interpretation
on 10/05/2020 at [DATE] to provider SORNALY SOTUN , who verbally
acknowledged these results.
IMPRESSION: Redemonstrated retained metallic foreign bodies within the bilateral
scalp and forehead soft tissues and bilateral orbits. Streak and
beam hardening artifact arising from the retained metallic foreign
bodies partially obscures the intracranial contents.

Abnormal hypodensity within the left cerebellar white matter
measuring 3.0 x 2.6 x 1.7 cm. This finding is new as compared to the
prior PET-CT of 06/12/2020, and has slightly increased in size since
the head CT of 09/13/2020. This finding is nonspecific and
indeterminate in etiology. Although there is no definite
corresponding enhancement or mass effect at this time, a mass lesion
related to lymphoma or a primary CNS neoplasm remain possibilities,
among other considerations. Additionally, there are small foci of
subcortical white matter hypodensity within the left frontoparietal
and right frontal lobes with apparent U-fiber involvement. If the
patient is immunosuppressed, this also raises the possibility of
progressive multifocal leukoencephalopathy (PML). Clinical
correlation is recommended. Short interval 2-4 week
contrast-enhanced CT follow-up is also recommended.

Mild cerebral atrophy.

Bilateral phthisis bulbi.

Mild ethmoid sinus mucosal thickening.

## 2022-03-16 IMAGING — DX DG CHEST 1V PORT
1 series · 1 of 1 positions shown · non-contrast
Comparison: September 25, 2020.

CLINICAL DATA: Confusion.

EXAM:
PORTABLE CHEST 1 VIEW

[chest ap]
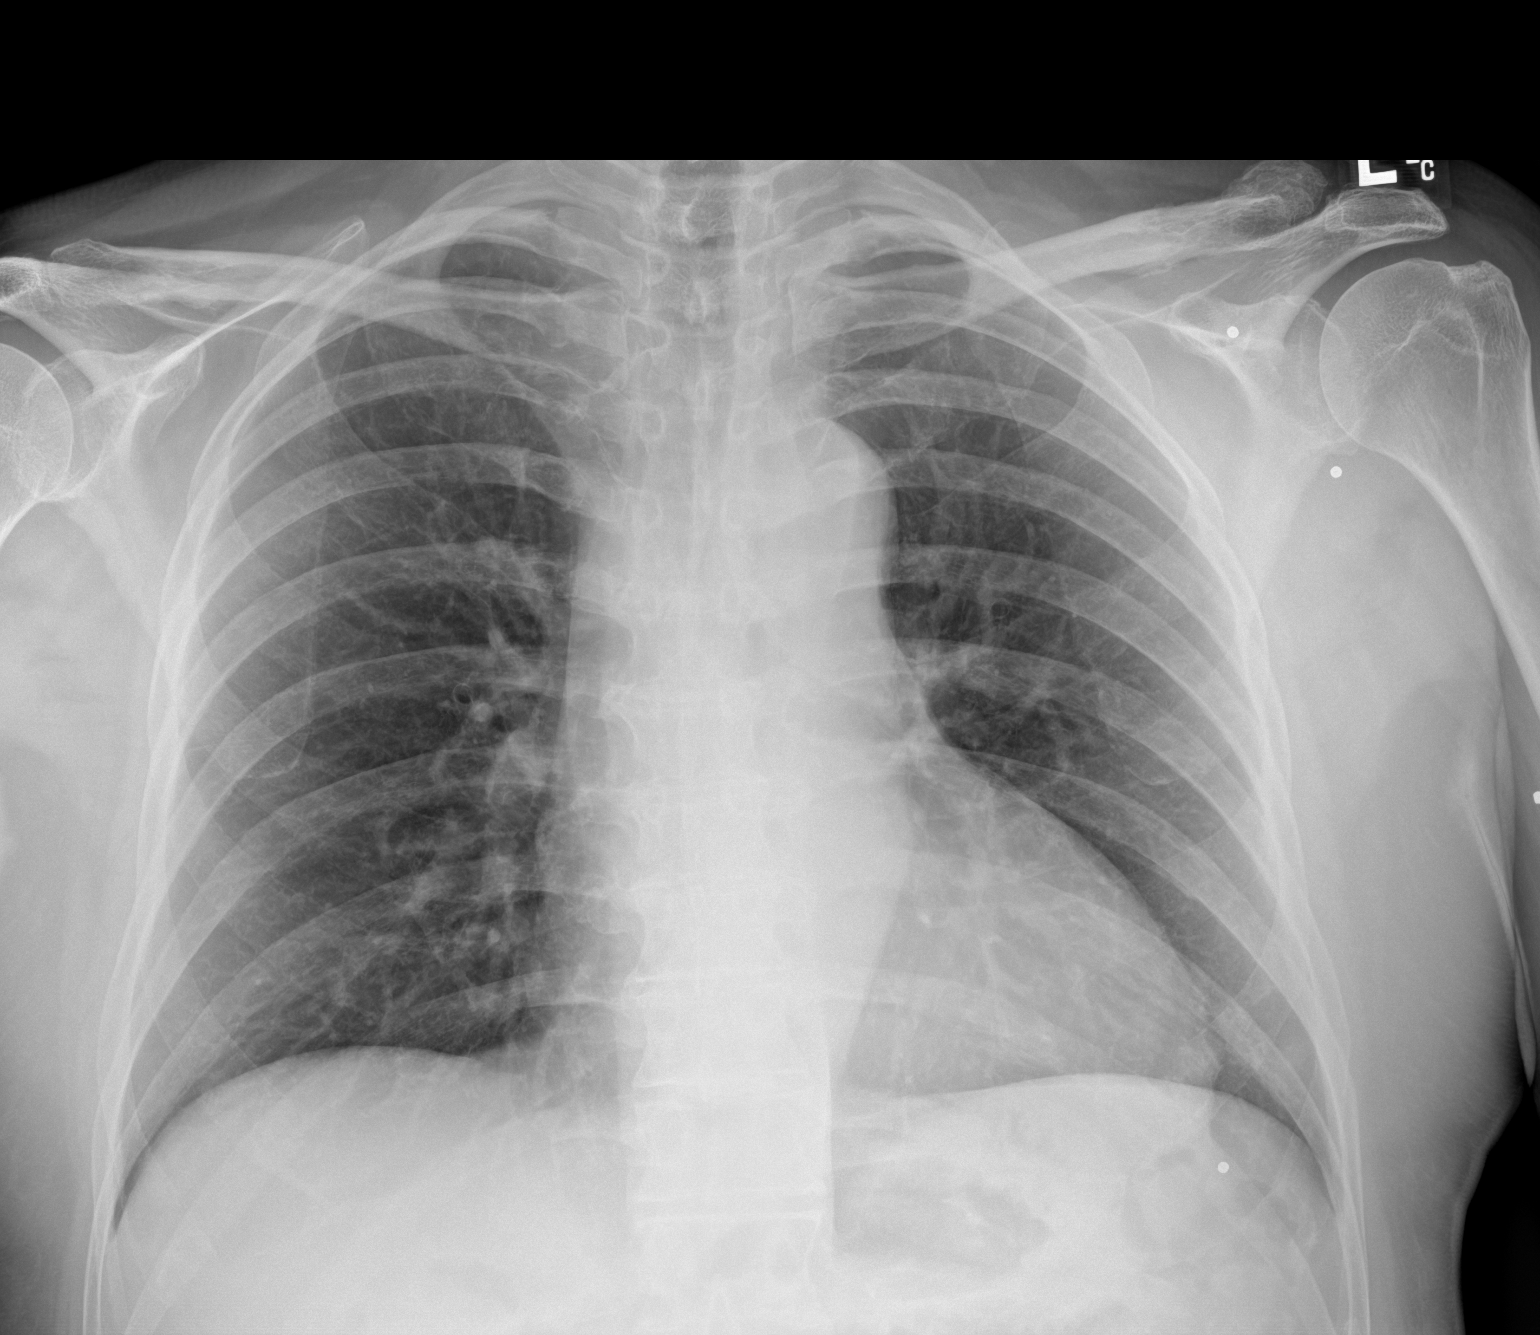

[1 of 1 positions shown; findings below may reference images not displayed]

FINDINGS: The heart size and mediastinal contours are within normal limits.
Both lungs are clear. The visualized skeletal structures are
unremarkable.
IMPRESSION: No active disease.
